# Patient Record
Sex: Male | Born: 1950
Health system: Southern US, Community
[De-identification: ages and names within clinical notes are randomized; demographics above are authoritative.]

## PROBLEM LIST (undated history)

## (undated) DIAGNOSIS — I10 Essential (primary) hypertension: Secondary | ICD-10-CM

## (undated) DIAGNOSIS — L509 Urticaria, unspecified: Secondary | ICD-10-CM

## (undated) DIAGNOSIS — E785 Hyperlipidemia, unspecified: Secondary | ICD-10-CM

## (undated) DIAGNOSIS — E119 Type 2 diabetes mellitus without complications: Secondary | ICD-10-CM

## (undated) DIAGNOSIS — R0602 Shortness of breath: Secondary | ICD-10-CM

## (undated) DIAGNOSIS — K219 Gastro-esophageal reflux disease without esophagitis: Secondary | ICD-10-CM

---

## 2009-02-19 ENCOUNTER — Inpatient Hospital Stay (HOSPITAL_COMMUNITY): Admission: EM | Admit: 2009-02-19 | Discharge: 2009-02-21 | Payer: Self-pay

## 2009-03-04 ENCOUNTER — Ambulatory Visit (HOSPITAL_BASED_OUTPATIENT_CLINIC_OR_DEPARTMENT_OTHER): Admission: RE | Admit: 2009-03-04 | Discharge: 2009-03-04 | Payer: Self-pay | Admitting: Plastic Surgery

## 2009-04-11 HISTORY — PX: COLOSTOMY REVERSAL: SHX5782

## 2009-04-11 HISTORY — PX: WOUND DEBRIDEMENT: SHX247

## 2009-04-11 HISTORY — PX: COLON SURGERY: SHX602

## 2009-05-25 ENCOUNTER — Encounter (INDEPENDENT_AMBULATORY_CARE_PROVIDER_SITE_OTHER): Payer: Self-pay | Admitting: Surgery

## 2009-05-25 ENCOUNTER — Inpatient Hospital Stay (HOSPITAL_COMMUNITY): Admission: RE | Admit: 2009-05-25 | Discharge: 2009-05-29 | Payer: Self-pay | Admitting: Surgery

## 2010-07-02 LAB — GLUCOSE, CAPILLARY
Glucose-Capillary: 107 mg/dL — ABNORMAL HIGH (ref 70–99)
Glucose-Capillary: 109 mg/dL — ABNORMAL HIGH (ref 70–99)
Glucose-Capillary: 112 mg/dL — ABNORMAL HIGH (ref 70–99)
Glucose-Capillary: 115 mg/dL — ABNORMAL HIGH (ref 70–99)
Glucose-Capillary: 118 mg/dL — ABNORMAL HIGH (ref 70–99)
Glucose-Capillary: 118 mg/dL — ABNORMAL HIGH (ref 70–99)
Glucose-Capillary: 129 mg/dL — ABNORMAL HIGH (ref 70–99)
Glucose-Capillary: 137 mg/dL — ABNORMAL HIGH (ref 70–99)
Glucose-Capillary: 142 mg/dL — ABNORMAL HIGH (ref 70–99)
Glucose-Capillary: 144 mg/dL — ABNORMAL HIGH (ref 70–99)
Glucose-Capillary: 146 mg/dL — ABNORMAL HIGH (ref 70–99)
Glucose-Capillary: 84 mg/dL (ref 70–99)

## 2010-07-02 LAB — DIFFERENTIAL
Basophils Absolute: 0 10*3/uL (ref 0.0–0.1)
Basophils Relative: 1 % (ref 0–1)
Eosinophils Absolute: 0.6 10*3/uL (ref 0.0–0.7)
Eosinophils Relative: 7 % — ABNORMAL HIGH (ref 0–5)
Lymphs Abs: 3.5 10*3/uL (ref 0.7–4.0)
Monocytes Relative: 6 % (ref 3–12)
Neutrophils Relative %: 50 % (ref 43–77)

## 2010-07-02 LAB — COMPREHENSIVE METABOLIC PANEL
ALT: 14 U/L (ref 0–53)
AST: 17 U/L (ref 0–37)
Albumin: 3.8 g/dL (ref 3.5–5.2)
Calcium: 9.7 mg/dL (ref 8.4–10.5)
Potassium: 4.5 mEq/L (ref 3.5–5.1)

## 2010-07-02 LAB — URINALYSIS, ROUTINE W REFLEX MICROSCOPIC
Glucose, UA: NEGATIVE mg/dL
Hgb urine dipstick: NEGATIVE
Nitrite: NEGATIVE
Protein, ur: NEGATIVE mg/dL
Specific Gravity, Urine: 1.019 (ref 1.005–1.030)
Urobilinogen, UA: 0.2 mg/dL (ref 0.0–1.0)

## 2010-07-02 LAB — PROTIME-INR: INR: 1.06 (ref 0.00–1.49)

## 2010-07-02 LAB — CBC
Hemoglobin: 13.4 g/dL (ref 13.0–17.0)
MCV: 88.5 fL (ref 78.0–100.0)
Platelets: 271 10*3/uL (ref 150–400)
RDW: 15.4 % (ref 11.5–15.5)

## 2010-07-14 LAB — GLUCOSE, CAPILLARY
Glucose-Capillary: 101 mg/dL — ABNORMAL HIGH (ref 70–99)
Glucose-Capillary: 102 mg/dL — ABNORMAL HIGH (ref 70–99)
Glucose-Capillary: 106 mg/dL — ABNORMAL HIGH (ref 70–99)
Glucose-Capillary: 111 mg/dL — ABNORMAL HIGH (ref 70–99)
Glucose-Capillary: 99 mg/dL (ref 70–99)

## 2010-07-14 LAB — MAGNESIUM: Magnesium: 1.8 mg/dL (ref 1.5–2.5)

## 2010-07-14 LAB — BASIC METABOLIC PANEL
CO2: 24 mEq/L (ref 19–32)
Chloride: 106 mEq/L (ref 96–112)
Creatinine, Ser: 1.28 mg/dL (ref 0.4–1.5)
GFR calc Af Amer: >60 mL/min (ref >60)
Potassium: 3.6 mEq/L (ref 3.5–5.1)
Sodium: 138 mEq/L (ref 135–145)

## 2010-07-14 LAB — CBC
HCT: 29.9 % — ABNORMAL LOW (ref 39.0–52.0)
Hemoglobin: 10.4 g/dL — ABNORMAL LOW (ref 13.0–17.0)
Hemoglobin: 11.6 g/dL — ABNORMAL LOW (ref 13.0–17.0)
MCHC: 34.4 g/dL (ref 30.0–36.0)
MCHC: 34.8 g/dL (ref 30.0–36.0)
MCV: 87.1 fL (ref 78.0–100.0)
RBC: 3.43 MIL/uL — ABNORMAL LOW (ref 4.22–5.81)
RBC: 3.87 MIL/uL — ABNORMAL LOW (ref 4.22–5.81)
RDW: 15.1 % (ref 11.5–15.5)
WBC: 10.6 10*3/uL — ABNORMAL HIGH (ref 4.0–10.5)

## 2010-07-14 LAB — POCT I-STAT, CHEM 8
BUN: 33 mg/dL — ABNORMAL HIGH (ref 6–23)
Chloride: 110 mEq/L (ref 96–112)
Creatinine, Ser: 2.8 mg/dL — ABNORMAL HIGH (ref 0.4–1.5)
Potassium: 4.7 mEq/L (ref 3.5–5.1)
Sodium: 140 mEq/L (ref 135–145)

## 2010-07-14 LAB — COMPREHENSIVE METABOLIC PANEL
ALT: 19 U/L (ref 0–53)
Alkaline Phosphatase: 39 U/L (ref 39–117)
BUN: 12 mg/dL (ref 6–23)
CO2: 27 mEq/L (ref 19–32)
GFR calc non Af Amer: 59 mL/min — ABNORMAL LOW (ref >60)
Glucose, Bld: 90 mg/dL (ref 70–99)
Potassium: 3.8 mEq/L (ref 3.5–5.1)
Sodium: 141 mEq/L (ref 135–145)
Total Protein: 6.7 g/dL (ref 6.0–8.3)

## 2010-07-14 LAB — PHOSPHORUS: Phosphorus: 3.1 mg/dL (ref 2.3–4.6)

## 2012-08-27 ENCOUNTER — Encounter (HOSPITAL_BASED_OUTPATIENT_CLINIC_OR_DEPARTMENT_OTHER): Payer: Self-pay | Admitting: *Deleted

## 2012-08-27 NOTE — Progress Notes (Signed)
08/27/12 1032  OBSTRUCTIVE SLEEP APNEA  Have you ever been diagnosed with sleep apnea through a sleep study? No  Do you snore loudly (loud enough to be heard through closed doors)?  1  Do you often feel tired, fatigued, or sleepy during the daytime? 0  Has anyone observed you stop breathing during your sleep? 0  Do you have, or are you being treated for high blood pressure? 1  BMI more than 35 kg/m2? 1  Age over 62 years old? 1  Gender: 1  Obstructive Sleep Apnea Score 5  Score 4 or greater  Results sent to PCP

## 2012-08-27 NOTE — Progress Notes (Signed)
pts ankle has been fx 3 months-not healing-had labs and ekg 3 days ago-called To bring all meds and overnight bag

## 2012-08-29 ENCOUNTER — Other Ambulatory Visit: Payer: Self-pay | Admitting: Orthopedic Surgery

## 2012-08-30 ENCOUNTER — Ambulatory Visit (HOSPITAL_BASED_OUTPATIENT_CLINIC_OR_DEPARTMENT_OTHER): Payer: Worker's Compensation | Admitting: Anesthesiology

## 2012-08-30 ENCOUNTER — Encounter (HOSPITAL_BASED_OUTPATIENT_CLINIC_OR_DEPARTMENT_OTHER)
Admission: RE | Disposition: A | Discharge status: Home / Self Care | Payer: Self-pay | Source: Ambulatory Visit | Attending: Orthopedic Surgery

## 2012-08-30 ENCOUNTER — Encounter (HOSPITAL_BASED_OUTPATIENT_CLINIC_OR_DEPARTMENT_OTHER): Payer: Self-pay | Admitting: *Deleted

## 2012-08-30 ENCOUNTER — Ambulatory Visit (HOSPITAL_BASED_OUTPATIENT_CLINIC_OR_DEPARTMENT_OTHER)
Admission: RE | Admit: 2012-08-30 | Discharge: 2012-08-31 | Disposition: A | Discharge status: Home / Self Care | Payer: Worker's Compensation | Source: Ambulatory Visit | Attending: Orthopedic Surgery | Admitting: Orthopedic Surgery

## 2012-08-30 ENCOUNTER — Ambulatory Visit (HOSPITAL_COMMUNITY): Payer: Worker's Compensation

## 2012-08-30 ENCOUNTER — Encounter (HOSPITAL_BASED_OUTPATIENT_CLINIC_OR_DEPARTMENT_OTHER): Payer: Self-pay | Admitting: Anesthesiology

## 2012-08-30 DIAGNOSIS — E785 Hyperlipidemia, unspecified: Secondary | ICD-10-CM | POA: Insufficient documentation

## 2012-08-30 DIAGNOSIS — S82409A Unspecified fracture of shaft of unspecified fibula, initial encounter for closed fracture: Secondary | ICD-10-CM | POA: Insufficient documentation

## 2012-08-30 DIAGNOSIS — Z7982 Long term (current) use of aspirin: Secondary | ICD-10-CM | POA: Insufficient documentation

## 2012-08-30 DIAGNOSIS — Y9269 Other specified industrial and construction area as the place of occurrence of the external cause: Secondary | ICD-10-CM | POA: Insufficient documentation

## 2012-08-30 DIAGNOSIS — X58XXXA Exposure to other specified factors, initial encounter: Secondary | ICD-10-CM | POA: Insufficient documentation

## 2012-08-30 DIAGNOSIS — Z87891 Personal history of nicotine dependence: Secondary | ICD-10-CM | POA: Insufficient documentation

## 2012-08-30 DIAGNOSIS — Z79899 Other long term (current) drug therapy: Secondary | ICD-10-CM | POA: Insufficient documentation

## 2012-08-30 DIAGNOSIS — S82209A Unspecified fracture of shaft of unspecified tibia, initial encounter for closed fracture: Secondary | ICD-10-CM | POA: Insufficient documentation

## 2012-08-30 DIAGNOSIS — K219 Gastro-esophageal reflux disease without esophagitis: Secondary | ICD-10-CM | POA: Insufficient documentation

## 2012-08-30 DIAGNOSIS — I10 Essential (primary) hypertension: Secondary | ICD-10-CM | POA: Insufficient documentation

## 2012-08-30 DIAGNOSIS — E119 Type 2 diabetes mellitus without complications: Secondary | ICD-10-CM | POA: Insufficient documentation

## 2012-08-30 DIAGNOSIS — S82871P Displaced pilon fracture of right tibia, subsequent encounter for closed fracture with malunion: Secondary | ICD-10-CM

## 2012-08-30 HISTORY — DX: Hyperlipidemia, unspecified: E78.5

## 2012-08-30 HISTORY — PX: OPEN REDUCTION INTERNAL FIXATION (ORIF) TIBIA/FIBULA FRACTURE: SHX5992

## 2012-08-30 HISTORY — DX: Shortness of breath: R06.02

## 2012-08-30 HISTORY — DX: Type 2 diabetes mellitus without complications: E11.9

## 2012-08-30 HISTORY — DX: Gastro-esophageal reflux disease without esophagitis: K21.9

## 2012-08-30 HISTORY — DX: Essential (primary) hypertension: I10

## 2012-08-30 LAB — GLUCOSE, CAPILLARY
Glucose-Capillary: 127 mg/dL — ABNORMAL HIGH (ref 70–99)
Glucose-Capillary: 144 mg/dL — ABNORMAL HIGH (ref 70–99)

## 2012-08-30 SURGERY — OPEN REDUCTION INTERNAL FIXATION (ORIF) TIBIA/FIBULA FRACTURE
Anesthesia: General | Site: Leg Lower | Laterality: Right | Wound class: Clean

## 2012-08-30 MED ORDER — CEFAZOLIN SODIUM-DEXTROSE 2-3 GM-% IV SOLR
2.0000 g | Freq: Four times a day (QID) | INTRAVENOUS | Status: DC
  Administered 2012-08-30 – 2012-08-31 (×2): 2 g via INTRAVENOUS

## 2012-08-30 MED ORDER — PIOGLITAZONE HCL 30 MG PO TABS: 30.0000 mg | ORAL_TABLET | Freq: Every day | ORAL | Status: DC

## 2012-08-30 MED ORDER — CEFAZOLIN SODIUM 1-5 GM-% IV SOLN
1.0000 g | Freq: Three times a day (TID) | INTRAVENOUS | Status: DC
  Administered 2012-08-30: 1 g via INTRAVENOUS

## 2012-08-30 MED ORDER — LISINOPRIL 10 MG PO TABS: 10.0000 mg | ORAL_TABLET | Freq: Every day | ORAL | Status: DC

## 2012-08-30 MED ORDER — DOCUSATE SODIUM 100 MG PO CAPS: 100.0000 mg | ORAL_CAPSULE | Freq: Two times a day (BID) | ORAL | Status: DC

## 2012-08-30 MED ORDER — OXYCODONE HCL 5 MG/5ML PO SOLN: 5.0000 mg | Freq: Once | ORAL | Status: DC | PRN

## 2012-08-30 MED ORDER — CHLORHEXIDINE GLUCONATE 4 % EX LIQD: 60.0000 mL | Freq: Once | CUTANEOUS | Status: DC

## 2012-08-30 MED ORDER — ASPIRIN EC 325 MG PO TBEC
325.0000 mg | DELAYED_RELEASE_TABLET | Freq: Every day | ORAL | Status: DC
  Administered 2012-08-30: 325 mg via ORAL

## 2012-08-30 MED ORDER — DEXTROSE 5 % IV SOLN
3.0000 g | INTRAVENOUS | Status: AC
  Administered 2012-08-30: 2 g via INTRAVENOUS

## 2012-08-30 MED ORDER — 0.9 % SODIUM CHLORIDE (POUR BTL) OPTIME
TOPICAL | Status: DC | PRN
  Administered 2012-08-30: 500 mL

## 2012-08-30 MED ORDER — CEFAZOLIN SODIUM-DEXTROSE 2-3 GM-% IV SOLR: 2.0000 g | Freq: Four times a day (QID) | INTRAVENOUS | Status: DC

## 2012-08-30 MED ORDER — SENNOSIDES 8.6 MG PO TABS: 2.0000 | ORAL_TABLET | Freq: Every day | ORAL | Status: DC

## 2012-08-30 MED ORDER — SODIUM CHLORIDE 0.9 % IV SOLN: INTRAVENOUS | Status: DC

## 2012-08-30 MED ORDER — FENTANYL CITRATE 0.05 MG/ML IJ SOLN
INTRAMUSCULAR | Status: DC | PRN
  Administered 2012-08-30 (×3): 25 ug via INTRAVENOUS

## 2012-08-30 MED ORDER — FENOFIBRATE 160 MG PO TABS: 160.0000 mg | ORAL_TABLET | Freq: Every day | ORAL | Status: DC

## 2012-08-30 MED ORDER — OXYCODONE HCL 5 MG PO TABS
5.0000 mg | ORAL_TABLET | ORAL | Status: DC | PRN
  Administered 2012-08-30 – 2012-08-31 (×4): 10 mg via ORAL

## 2012-08-30 MED ORDER — INSULIN ASPART 100 UNIT/ML ~~LOC~~ SOLN: 0.0000 [IU] | Freq: Three times a day (TID) | SUBCUTANEOUS | Status: DC

## 2012-08-30 MED ORDER — MORPHINE SULFATE 2 MG/ML IJ SOLN
2.0000 mg | INTRAMUSCULAR | Status: DC | PRN
  Administered 2012-08-30 – 2012-08-31 (×3): 2 mg via INTRAVENOUS

## 2012-08-30 MED ORDER — SENNA 8.6 MG PO TABS: 2.0000 | ORAL_TABLET | Freq: Two times a day (BID) | ORAL | Status: DC

## 2012-08-30 MED ORDER — HYDROMORPHONE HCL PF 1 MG/ML IJ SOLN
0.2500 mg | INTRAMUSCULAR | Status: DC | PRN
  Administered 2012-08-30 (×2): 0.5 mg via INTRAVENOUS

## 2012-08-30 MED ORDER — ONDANSETRON HCL 4 MG/2ML IJ SOLN: 4.0000 mg | Freq: Four times a day (QID) | INTRAMUSCULAR | Status: DC | PRN

## 2012-08-30 MED ORDER — OMEGA-3 FATTY ACIDS 1000 MG PO CAPS: 2.0000 g | ORAL_CAPSULE | Freq: Every day | ORAL | Status: DC

## 2012-08-30 MED ORDER — METFORMIN HCL 500 MG PO TABS: 500.0000 mg | ORAL_TABLET | Freq: Two times a day (BID) | ORAL | Status: DC

## 2012-08-30 MED ORDER — SODIUM CHLORIDE 0.9 % IV SOLN
INTRAVENOUS | Status: DC
  Administered 2012-08-30: 18:00:00 via INTRAVENOUS

## 2012-08-30 MED ORDER — ONDANSETRON HCL 4 MG PO TABS: 4.0000 mg | ORAL_TABLET | Freq: Four times a day (QID) | ORAL | Status: DC | PRN

## 2012-08-30 MED ORDER — MIDAZOLAM HCL 5 MG/5ML IJ SOLN
INTRAMUSCULAR | Status: DC | PRN
  Administered 2012-08-30: 2 mg via INTRAVENOUS

## 2012-08-30 MED ORDER — BACITRACIN ZINC 500 UNIT/GM EX OINT
TOPICAL_OINTMENT | CUTANEOUS | Status: DC | PRN
  Administered 2012-08-30: 1 via TOPICAL

## 2012-08-30 MED ORDER — PROPOFOL 10 MG/ML IV BOLUS
INTRAVENOUS | Status: DC | PRN
  Administered 2012-08-30: 300 mg via INTRAVENOUS

## 2012-08-30 MED ORDER — SIMVASTATIN 20 MG PO TABS: 20.0000 mg | ORAL_TABLET | Freq: Every evening | ORAL | Status: DC

## 2012-08-30 MED ORDER — OXYCODONE HCL 5 MG PO TABS: 5.0000 mg | ORAL_TABLET | ORAL | Status: DC | PRN

## 2012-08-30 MED ORDER — MIDAZOLAM HCL 2 MG/2ML IJ SOLN
1.0000 mg | INTRAMUSCULAR | Status: DC | PRN
  Administered 2012-08-30: 2 mg via INTRAVENOUS

## 2012-08-30 MED ORDER — ASPIRIN EC 325 MG PO TBEC: 325.0000 mg | DELAYED_RELEASE_TABLET | Freq: Every day | ORAL | Status: DC

## 2012-08-30 MED ORDER — ENOXAPARIN SODIUM 40 MG/0.4ML ~~LOC~~ SOLN: 40.0000 mg | SUBCUTANEOUS | Status: DC

## 2012-08-30 MED ORDER — LIDOCAINE HCL (CARDIAC) 20 MG/ML IV SOLN
INTRAVENOUS | Status: DC | PRN
  Administered 2012-08-30: 75 mg via INTRAVENOUS

## 2012-08-30 MED ORDER — ONDANSETRON HCL 4 MG/2ML IJ SOLN: 4.0000 mg | Freq: Once | INTRAMUSCULAR | Status: DC | PRN

## 2012-08-30 MED ORDER — OXYCODONE HCL 5 MG PO TABS: 5.0000 mg | ORAL_TABLET | Freq: Once | ORAL | Status: DC | PRN

## 2012-08-30 MED ORDER — BUPIVACAINE-EPINEPHRINE PF 0.5-1:200000 % IJ SOLN
INTRAMUSCULAR | Status: DC | PRN
  Administered 2012-08-30: 30 mL

## 2012-08-30 MED ORDER — FENTANYL CITRATE 0.05 MG/ML IJ SOLN
50.0000 ug | INTRAMUSCULAR | Status: DC | PRN
  Administered 2012-08-30: 100 ug via INTRAVENOUS

## 2012-08-30 MED ORDER — LACTATED RINGERS IV SOLN
INTRAVENOUS | Status: DC
  Administered 2012-08-30 (×2): via INTRAVENOUS

## 2012-08-30 SURGICAL SUPPLY — 91 items
BAG DECANTER FOR FLEXI CONT (MISCELLANEOUS) IMPLANT
BANDAGE ESMARK 6X9 LF (GAUZE/BANDAGES/DRESSINGS) ×1 IMPLANT
BIT DRILL 2.5X2.75 QC CALB (BIT) ×2 IMPLANT
BIT DRILL CALIBRATED 2.7 (BIT) ×2 IMPLANT
BLADE SURG 15 STRL LF DISP TIS (BLADE) ×3 IMPLANT
BLADE SURG 15 STRL SS (BLADE) ×3
BNDG COHESIVE 4X5 TAN STRL (GAUZE/BANDAGES/DRESSINGS) ×2 IMPLANT
BNDG COHESIVE 6X5 TAN STRL LF (GAUZE/BANDAGES/DRESSINGS) ×2 IMPLANT
BNDG ESMARK 6X9 LF (GAUZE/BANDAGES/DRESSINGS) ×2
CHLORAPREP W/TINT 26ML (MISCELLANEOUS) ×2 IMPLANT
COVER TABLE BACK 60X90 (DRAPES) ×4 IMPLANT
CUFF TOURNIQUET SINGLE 34IN LL (TOURNIQUET CUFF) ×2 IMPLANT
DECANTER SPIKE VIAL GLASS SM (MISCELLANEOUS) IMPLANT
DRAPE C-ARM 42X72 X-RAY (DRAPES) ×2 IMPLANT
DRAPE C-ARMOR (DRAPES) ×2 IMPLANT
DRAPE EXTREMITY T 121X128X90 (DRAPE) ×2 IMPLANT
DRAPE OEC MINIVIEW 54X84 (DRAPES) IMPLANT
DRAPE U-SHAPE 47X51 STRL (DRAPES) ×2 IMPLANT
DRSG EMULSION OIL 3X3 NADH (GAUZE/BANDAGES/DRESSINGS) ×2 IMPLANT
DRSG PAD ABDOMINAL 8X10 ST (GAUZE/BANDAGES/DRESSINGS) ×4 IMPLANT
GLOVE BIO SURGEON STRL SZ 6.5 (GLOVE) ×2 IMPLANT
GLOVE BIO SURGEON STRL SZ7 (GLOVE) ×2 IMPLANT
GLOVE BIO SURGEON STRL SZ8 (GLOVE) ×2 IMPLANT
GLOVE BIOGEL PI IND STRL 6.5 (GLOVE) ×1 IMPLANT
GLOVE BIOGEL PI IND STRL 7.0 (GLOVE) ×1 IMPLANT
GLOVE BIOGEL PI IND STRL 8 (GLOVE) ×1 IMPLANT
GLOVE BIOGEL PI INDICATOR 6.5 (GLOVE) ×1
GLOVE BIOGEL PI INDICATOR 7.0 (GLOVE) ×1
GLOVE BIOGEL PI INDICATOR 8 (GLOVE) ×1
GLOVE EXAM NITRILE MD LF STRL (GLOVE) ×2 IMPLANT
GOWN PREVENTION PLUS XLARGE (GOWN DISPOSABLE) IMPLANT
GOWN PREVENTION PLUS XXLARGE (GOWN DISPOSABLE) IMPLANT
K-WIRE ACE 1.6X6 (WIRE) ×10
KWIRE ACE 1.6X6 (WIRE) ×5 IMPLANT
NEEDLE HYPO 22GX1.5 SAFETY (NEEDLE) IMPLANT
NS IRRIG 1000ML POUR BTL (IV SOLUTION) ×2 IMPLANT
PACK BASIN DAY SURGERY FS (CUSTOM PROCEDURE TRAY) ×2 IMPLANT
PAD CAST 4YDX4 CTTN HI CHSV (CAST SUPPLIES) ×2 IMPLANT
PADDING CAST ABS 4INX4YD NS (CAST SUPPLIES)
PADDING CAST ABS COTTON 4X4 ST (CAST SUPPLIES) IMPLANT
PADDING CAST COTTON 4X4 STRL (CAST SUPPLIES) ×2
PENCIL BUTTON HOLSTER BLD 10FT (ELECTRODE) ×2 IMPLANT
PLATE 6H RT DIST ANTLAT TIB (Plate) ×1 IMPLANT
PLATE ACE 100DEG 7HOLE (Plate) ×2 IMPLANT
PLATE ANTLAT CNTR W 115X6 (Plate) ×1 IMPLANT
SANITIZER HAND PURELL 535ML FO (MISCELLANEOUS) ×2 IMPLANT
SCREW CANC LAG 4X45 (Screw) ×2 IMPLANT
SCREW CORT FT 32X3.5XNONLOCK (Screw) ×1 IMPLANT
SCREW CORTICAL 3.5MM  16MM (Screw) ×1 IMPLANT
SCREW CORTICAL 3.5MM  30MM (Screw) ×1 IMPLANT
SCREW CORTICAL 3.5MM  32MM (Screw) ×1 IMPLANT
SCREW CORTICAL 3.5MM  34MM (Screw) ×2 IMPLANT
SCREW CORTICAL 3.5MM 14MM (Screw) ×6 IMPLANT
SCREW CORTICAL 3.5MM 16MM (Screw) ×1 IMPLANT
SCREW CORTICAL 3.5MM 18MM (Screw) ×2 IMPLANT
SCREW CORTICAL 3.5MM 22MM (Screw) ×4 IMPLANT
SCREW CORTICAL 3.5MM 30MM (Screw) ×1 IMPLANT
SCREW CORTICAL 3.5MM 34MM (Screw) ×2 IMPLANT
SCREW LOCK 3.5X32 DIST TIB (Orthopedic Implant) ×2 IMPLANT
SCREW LOCK CORT STAR 3.5X28 (Screw) ×2 IMPLANT
SCREW LOCK CORT STAR 3.5X36 (Screw) ×2 IMPLANT
SCREW LOCK CORT STAR 3.5X38 (Screw) ×2 IMPLANT
SCREW LOCK CORT STAR 3.5X44 (Screw) ×2 IMPLANT
SCREW LOCK CORT STAR 3.5X46 (Screw) ×2 IMPLANT
SCREW NLOCK CANC HEX 4X18 (Screw) ×1 IMPLANT
SCREW NLOCK CANC HEX 4X18 FIB (Screw) ×1 IMPLANT
SCREW SCHNZ BLNT TRCR PT 5X200 (Screw) ×2 IMPLANT
SCRW SCHANZ BLNT TRCR PT 5X200 (Screw) ×4 IMPLANT
SHEET MEDIUM DRAPE 40X70 STRL (DRAPES) ×4 IMPLANT
SLEEVE SCD COMPRESS KNEE MED (MISCELLANEOUS) ×2 IMPLANT
SPLINT FAST PLASTER 5X30 (CAST SUPPLIES) ×20
SPLINT PLASTER CAST FAST 5X30 (CAST SUPPLIES) ×20 IMPLANT
SPONGE GAUZE 4X4 12PLY (GAUZE/BANDAGES/DRESSINGS) ×2 IMPLANT
SPONGE LAP 18X18 X RAY DECT (DISPOSABLE) ×2 IMPLANT
STAPLER VISISTAT 35W (STAPLE) IMPLANT
STOCKINETTE 6  STRL (DRAPES) ×1
STOCKINETTE 6 STRL (DRAPES) ×1 IMPLANT
STRIP CLOSURE SKIN 1/2X4 (GAUZE/BANDAGES/DRESSINGS) IMPLANT
SUCTION FRAZIER TIP 10 FR DISP (SUCTIONS) ×2 IMPLANT
SUT ETHILON 3 0 PS 1 (SUTURE) ×4 IMPLANT
SUT MNCRL AB 3-0 PS2 18 (SUTURE) ×4 IMPLANT
SUT MNCRL AB 4-0 PS2 18 (SUTURE) IMPLANT
SUT VIC AB 0 SH 27 (SUTURE) ×2 IMPLANT
SUT VIC AB 2-0 SH 27 (SUTURE)
SUT VIC AB 2-0 SH 27XBRD (SUTURE) IMPLANT
SUT VICRYL 4-0 PS2 18IN ABS (SUTURE) IMPLANT
SYR 20CC LL (SYRINGE) IMPLANT
SYR BULB 3OZ (MISCELLANEOUS) ×2 IMPLANT
TAP CORT 3.5MM SOLID DISP (TRAUMA) ×2 IMPLANT
TUBE CONNECTING 20X1/4 (TUBING) ×2 IMPLANT
UNDERPAD 30X30 INCONTINENT (UNDERPADS AND DIAPERS) ×2 IMPLANT

## 2012-08-30 NOTE — Anesthesia Preprocedure Evaluation (Signed)
Anesthesia Evaluation  Patient identified by MRN, date of birth, ID band Patient awake    Reviewed: Allergy & Precautions, H&P , NPO status , Patient's Chart, lab work & pertinent test results  Airway Mallampati: II TM Distance: >3 FB Neck ROM: Full    Dental  (+) Teeth Intact and Dental Advisory Given   Pulmonary  breath sounds clear to auscultation        Cardiovascular hypertension, Pt. on medications Rhythm:Regular Rate:Normal     Neuro/Psych    GI/Hepatic GERD-  Medicated,  Endo/Other  Morbid obesity  Renal/GU      Musculoskeletal   Abdominal   Peds  Hematology   Anesthesia Other Findings   Reproductive/Obstetrics                           Anesthesia Physical Anesthesia Plan  ASA: III  Anesthesia Plan: General   Post-op Pain Management:    Induction: Intravenous  Airway Management Planned: LMA  Additional Equipment:   Intra-op Plan:   Post-operative Plan: Extubation in OR  Informed Consent: I have reviewed the patients History and Physical, chart, labs and discussed the procedure including the risks, benefits and alternatives for the proposed anesthesia with the patient or authorized representative who has indicated his/her understanding and acceptance.   Dental advisory given  Plan Discussed with: CRNA and Anesthesiologist  Anesthesia Plan Comments:         Anesthesia Quick Evaluation

## 2012-08-30 NOTE — Progress Notes (Signed)
Assisted Dr. Crews with right, ultrasound guided saphenous block. Side rails up, monitors on throughout procedure. See vital signs in flow sheet. Tolerated Procedure well. 

## 2012-08-30 NOTE — Brief Op Note (Signed)
08/30/2012  4:17 PM  PATIENT:  [name]  62 y.o. male  PRE-OPERATIVE DIAGNOSIS:  DISPLACED RIGHT TIBIAL PILON AND FIBULA FRACTURES  POST-OPERATIVE DIAGNOSIS:  same  Procedure(s): 1.  Open reduction and internal fixation of right tibial pilon and fibula fractures  SURGEON:  Toni Arthurs, MD  ASSISTANT: n/a  ANESTHESIA:   General, regional  EBL:  minimal   TOURNIQUET:   Total Tourniquet Time Documented: Thigh (Right) - 140 minutes Total: Thigh (Right) - 140 minutes   COMPLICATIONS:  None apparent  DISPOSITION:  Extubated, awake and stable to recovery.  DICTATION ID:  [id number]

## 2012-08-30 NOTE — Transfer of Care (Signed)
Immediate Anesthesia Transfer of Care Note  Patient: Javier Walker  Procedure(s) Performed: Procedure(s): OPEN REDUCTION INTERNAL FIXATION (ORIF) TIBIA/FIBULA FRACTURE (Right)  Patient Location: PACU  Anesthesia Type:GA combined with regional for post-op pain  Level of Consciousness: sedated and patient cooperative  Airway & Oxygen Therapy: Patient Spontanous Breathing and Patient connected to face mask oxygen  Post-op Assessment: Report given to PACU RN and Post -op Vital signs reviewed and stable  Post vital signs: Reviewed and stable  Complications: No apparent anesthesia complications

## 2012-08-30 NOTE — H&P (Signed)
Javier Walker is an 61 y.o. male.   Chief Complaint: right tibial pilon and fibula fxs HPI: 62 yo male now 3 mos s/p right tibial pilon and fibula fractures sustained at work.  He was initially anatomically reduced, but his fractures have moved into unacceptable alignment.  He presents now for ORIF of his tibial pilon and fibula fractures.  Past Medical History  Diagnosis Date  . Hypertension   . Hyperlipemia   . Diabetes mellitus without complication   . GERD (gastroesophageal reflux disease)     occ-no meds  . Shortness of breath     Past Surgical History  Procedure Laterality Date  . Colon surgery  2011    gangrene lower colon with colostomy  . Wound debridement  2011    abd-  . Colostomy reversal  2011    History reviewed. No pertinent family history. Social History:  reports that he quit smoking about 24 years ago. He does not have any smokeless tobacco history on file. He reports that  drinks alcohol. His drug history is not on file.  Allergies: No Known Allergies  Medications Prior to Admission  Medication Sig Dispense Refill  . aspirin 81 MG tablet Take 81 mg by mouth daily.      . fenofibrate (TRICOR) 145 MG tablet Take 145 mg by mouth daily.      . fish oil-omega-3 fatty acids 1000 MG capsule Take 2 g by mouth daily.      [name] lisinopril (PRINIVIL,ZESTRIL) 10 MG tablet Take 10 mg by mouth daily.      . metFORMIN (GLUCOPHAGE) 500 MG tablet Take 500 mg by mouth 2 (two) times daily with a meal.      . pioglitazone (ACTOS) 15 MG tablet Take 30 mg by mouth daily.      . simvastatin (ZOCOR) 20 MG tablet Take 20 mg by mouth every evening.        Results for orders placed during the hospital encounter of 2012-09-10 (from the past 48 hour(s))  GLUCOSE, CAPILLARY     Status: Abnormal   Collection Time    09-10-12 11:49 AM      Result Value Range   Glucose-Capillary 111 (*) 70 - 99 mg/dL   No results found.  ROS  No recent f/c/n/v/ wt loss  Blood pressure 151/83, pulse  72, temperature 97.5 F (36.4 C), temperature source Oral, resp. rate 18, height 5\' 11"  (1.803 m), weight 117.935 kg (260 lb), SpO2 100.00%. Physical Exam  wn wd male in nad.  A and O.  Mood and affect normal.  EOMI.  Resp unlabored.  R ankle with healthy skin.  1= dp and PT pulses.  Feels LT throughout the ankle.  Active PF and DF 5/5 strength at toes.  Assessment/Plan Displaced right tibial pilon and fibula fractures - to OR for ORIF of tibia and fibula fractures.  The risks and benefits of the alternative treatment options have been discussed in detail.  The patient wishes to proceed with surgery and specifically understands risks of bleeding, infection, nerve damage, blood clots, need for additional surgery, amputation and death.   Toni Arthurs 09-10-2012, 12:32 PM

## 2012-08-30 NOTE — Progress Notes (Signed)
Assisted Dr. Crews with right, ultrasound guided, popliteal block. Side rails up, monitors on throughout procedure. See vital signs in flow sheet. Tolerated Procedure well. 

## 2012-08-30 NOTE — Progress Notes (Signed)
Pre sahpenous block timeout post orif Rt tib/fib fx. Dr. Sheldon Silvan and Royce Macadamia, RN. See Doc flow sheet.

## 2012-08-30 NOTE — Progress Notes (Signed)
1700-1710:  The pt was having severe pain in his medial right leg post op.  I elected to perform a Right Saphenous US guided nerve block.  Prep of the leg with Chlorhexidene, sterile procedure with gloves and draping.  22g, 9 cm needle was used and 12 ml of Marcaine 0.5% with 1:200,000 Epi was given.  Image was saved in the chart. Tolerated well.  He seemed to have improvement in his pain within 10 min.

## 2012-08-30 NOTE — Anesthesia Procedure Notes (Addendum)
Anesthesia Regional Block:  Popliteal block  Pre-Anesthetic Checklist: ,, timeout performed, Correct Patient, Correct Site, Correct Laterality, Correct Procedure, Correct Position, site marked, Risks and benefits discussed,  Surgical consent,  Pre-op evaluation,  At surgeon's request and post-op pain management  Laterality: Right and Lower  Prep: chloraprep       Needles:  Injection technique: Single-shot  Needle Type: Echogenic Needle     Needle Length: 9cm  Needle Gauge: 22 and 22 G    Additional Needles:  Procedures: ultrasound guided (picture in chart) Popliteal block Narrative:  Start time: 08/30/2012 12:20 PM End time: 08/30/2012 12:26 PM Injection made incrementally with aspirations every 5 mL.  Performed by: Personally  Anesthesiologist: Sheldon Silvan, MD  Popliteal block Procedure Name: LMA Insertion Date/Time: 08/30/2012 1:12 PM Performed by: Zenia Resides D Pre-anesthesia Checklist: Patient identified, Emergency Drugs available, Suction available and Patient being monitored Patient Re-evaluated:Patient Re-evaluated prior to inductionOxygen Delivery Method: Circle System Utilized Preoxygenation: Pre-oxygenation with 100% oxygen Intubation Type: IV induction Ventilation: Mask ventilation without difficulty LMA: LMA inserted LMA Size: 5.0 Number of attempts: 1 Airway Equipment and Method: bite block Placement Confirmation: positive ETCO2 Tube secured with: Tape Dental Injury: Teeth and Oropharynx as per pre-operative assessment

## 2012-08-30 NOTE — Anesthesia Postprocedure Evaluation (Signed)
  Anesthesia Post-op Note  Patient: Javier Walker  Procedure(s) Performed: Procedure(s): OPEN REDUCTION INTERNAL FIXATION (ORIF) TIBIA/FIBULA FRACTURE (Right)  Patient Location: PACU  Anesthesia Type:GA combined with regional for post-op pain  Level of Consciousness: awake, alert  and oriented  Airway and Oxygen Therapy: Patient Spontanous Breathing  Post-op Pain: mild  Post-op Assessment: Post-op Vital signs reviewed  Post-op Vital Signs: Reviewed  Complications: No apparent anesthesia complications

## 2012-08-31 LAB — GLUCOSE, CAPILLARY: Glucose-Capillary: 122 mg/dL — ABNORMAL HIGH (ref 70–99)

## 2012-08-31 LAB — POCT HEMOGLOBIN-HEMACUE: Hemoglobin: 15 g/dL (ref 13.0–17.0)

## 2012-08-31 NOTE — Op Note (Signed)
NAME:  CONNELLY, [place] NO.:  [id number]  MEDICAL RECORD NO.:  [id number]  LOCATION:                                 FACILITY:  PHYSICIAN:  Toni Arthurs, MD             DATE OF BIRTH:  DATE OF PROCEDURE:  08/30/2012 DATE OF DISCHARGE:                              OPERATIVE REPORT   PREOPERATIVE DIAGNOSIS:  Displaced right tibial pilon and fibula fractures.  POSTOPERATIVE DIAGNOSIS:  Displaced right tibial pilon and fibula fractures.  PROCEDURE:  Open reduction and internal fixation of right tibial pilon and fibula fractures.  SURGEON:  Toni Arthurs, MD  ANESTHESIA:  General, regional.  ESTIMATED BLOOD LOSS:  Minimal.  TOURNIQUET TIME:  2 hours and 20 minutes at 250 mmHg.  COMPLICATIONS:  None apparent.  DISPOSITION:  Extubated, awake, and stable to recovery.  INDICATIONS FOR PROCEDURE:  The patient is a 62 year old male with past medical history significant for diabetes.  He fell at work approximately 3 months ago, injuring his right distal tibia and fibula.  Upon initial presentation in clinic his fractures were anatomically reduced.  An attempt was made to treat this in closed fashion.  Given the risks associated with surgical treatment from his diabetes.  He initially did well, but on the most recent followup visit was noted to have significant displacement of his distal tibia and fibula fractures.  He presents now for operative treatment of these displaced unstable fractures.  He understands the risks and benefits, the alternative treatment options and elects surgical treatment.  He specifically understands risks of bleeding, infection, nerve damage, blood clots, need for additional surgery, amputation, and death.  PROCEDURE IN DETAIL:  After preoperative consent was obtained, the correct operative site was identified.  The patient was brought to the operating room and placed supine on the operating table.  General anesthesia was induced.   Preoperative antibiotics were administered. Surgical time-out was taken.  The right lower extremity was prepped and draped in standard sterile fashion with the tourniquet around the thigh. The extremity was exsanguinated and tourniquet was inflated to 250 mmHg. A longitudinal incision was then made over the lateral malleolus.  Sharp dissection was carried down through the skin and subcutaneous tissue. Fracture site was identified.  It was noted to be partially healed but displaced.  The fracture was mobilized with a curette rongeur and osteotome.  Once the fracture was mobile, attention was then turned to the anterior aspect of the ankle.  An anterior longitudinal incision was made over the extensor hallucis longus tendon.  Sharp dissection was carried down through the skin and subcutaneous tissue.  The EHL tendon sheath was incised.  The neurovascular bundle was identified.  It was retracted laterally and subperiosteal dissection was carried down to the anterior aspect of the tibia.  The anterior tibia was exposed at the fracture site above and below.  The fracture was noted to be displaced and significantly shortened.  Despite mobilizing the fracture fragments adequate length could not be restored.  The decision was made at that point to apply the Synthes Universal distractor. A stab incision was made at the inferior aspect of the tibial  tubercle. A 5-mm Schanz pin was inserted into the metaphyseal bone.  A second Schanz pin was inserted through a stab incision into the lateral wall of the calcaneus.  The universal distractor was applied and used to lengthen the fracture site.  Once adequate length was achieved, the fibular fracture was reduced.  It was held with a lobster claw as a 3.5- mm fully-threaded lag screw was inserted from anterior to posterior.  AP and lateral fluoroscopic images confirmed appropriate reduction and appropriate position length of the lag screw.  A 7-hole  1/3-tubular plate was then contoured to fit the lateral malleolus.  It was secured distally with 3 bicortical screws and proximally with 3 unicortical screws.  AP and lateral fluoroscopic images confirmed appropriate reduction of the fibula and appropriate position and length of all hardware.  Wound was irrigated copiously.  Inverted simple sutures of 3- 0 Monocryl were used to close the subcutaneous tissue over the plate, a running 3-0 nylon was used to close the skin incision.  Attention was then returned to the anterior aspect of the ankle.  The fracture fragments were mobilized and irrigated copiously.  They were reduced and held provisionally with a lobster claw proximally and K- wires distally.  AP and lateral fluoroscopic images confirmed appropriate reduction of the fractures.  A Biomet wide anterior plate 6 holes in length was applied to the anterior aspect of the tibia.  The proximal oblong hole was drilled and a 3.5-mm fully-threaded bicortical screw was inserted.  The guide pin was inserted in the distal limb of the plate.  The lateral view confirmed appropriate position of the plate over the anterior lip of the tibia.  The central distal hole was drilled and a 4-mm partially threaded screw was inserted.  This pulled the plate down securely to the anterior aspect of the tibia.  The remaining 3 locking holes in the distal limb of the plate were drilled and filled with fully-threaded locking screws.  The 4-mm partially threaded screw was then removed and replaced with a locking screw.  The distal most medial hole in the plate was then drilled and a locking multidirectional screw was inserted.  This gave a second point of purchase into the medial malleolus fragment.  Attention was then returned to the proximal end of the plate where another bicortical locking screw was inserted and 2 more non-locking bicortical screws were inserted.  The wound was again irrigated copiously.   Final AP, mortise, and lateral views of the ankle showed appropriate reduction of the fracture, and appropriate position and length of all hardware.  The external fixator was removed, along with both Shantz pins.  This had been performed prior to obtaining the final x-rays.  The retinaculum was then repaired with figure-of-eight and simple sutures of 0 Vicryl.  The subcutaneous tissue was approximated with inverted simple sutures of 3-0 Monocryl and a running 3-0 nylon was used to close the skin incision.  Sterile dressings were applied.  The tourniquet was released at 2 hours and 20 minutes after closure of the wound.  Sterile dressings were applied followed by well- padded short-leg splint.  The patient was awakened by anesthesia and transported to recovery room in stable condition.  FOLLOWUP PLAN:  The patient will be observed overnight for pain control. He will be nonweightbearing on the right lower extremity and follow up with me in 2 weeks for suture removal and conversion to a cast.     Toni Arthurs, MD  JH/MEDQ  D:  08/30/2012  T:  08/31/2012  Job:  [id number]

## 2012-09-04 ENCOUNTER — Encounter (HOSPITAL_BASED_OUTPATIENT_CLINIC_OR_DEPARTMENT_OTHER): Payer: Self-pay | Admitting: Orthopedic Surgery

## 2012-11-19 ENCOUNTER — Ambulatory Visit (HOSPITAL_COMMUNITY): Payer: Self-pay | Admitting: Physical Therapy

## 2015-12-22 LAB — HEMOGLOBIN A1C: Hemoglobin A1C: 9

## 2016-03-01 ENCOUNTER — Ambulatory Visit (INDEPENDENT_AMBULATORY_CARE_PROVIDER_SITE_OTHER): Payer: BLUE CROSS/BLUE SHIELD | Admitting: "Endocrinology

## 2016-03-01 ENCOUNTER — Encounter: Payer: Self-pay | Admitting: "Endocrinology

## 2016-03-01 ENCOUNTER — Ambulatory Visit: Payer: Self-pay | Admitting: "Endocrinology

## 2016-03-01 VITALS — BP 110/68 | HR 77 | Ht 71.0 in | Wt 222.0 lb

## 2016-03-01 DIAGNOSIS — E118 Type 2 diabetes mellitus with unspecified complications: Secondary | ICD-10-CM

## 2016-03-01 DIAGNOSIS — I10 Essential (primary) hypertension: Secondary | ICD-10-CM | POA: Diagnosis not present

## 2016-03-01 DIAGNOSIS — E7800 Pure hypercholesterolemia, unspecified: Secondary | ICD-10-CM | POA: Insufficient documentation

## 2016-03-01 DIAGNOSIS — Z6838 Body mass index (BMI) 38.0-38.9, adult: Secondary | ICD-10-CM

## 2016-03-01 DIAGNOSIS — N183 Chronic kidney disease, stage 3 (moderate): Secondary | ICD-10-CM

## 2016-03-01 DIAGNOSIS — IMO0002 Reserved for concepts with insufficient information to code with codable children: Secondary | ICD-10-CM

## 2016-03-01 DIAGNOSIS — E1122 Type 2 diabetes mellitus with diabetic chronic kidney disease: Secondary | ICD-10-CM | POA: Insufficient documentation

## 2016-03-01 DIAGNOSIS — E1165 Type 2 diabetes mellitus with hyperglycemia: Secondary | ICD-10-CM

## 2016-03-01 DIAGNOSIS — Z794 Long term (current) use of insulin: Secondary | ICD-10-CM

## 2016-03-01 NOTE — Progress Notes (Signed)
Subjective:    Patient ID: Javier Walker, male    DOB: Sep 04, 1950. Patient is being seen in consultation for management of diabetes requested by  Jana Half  Past Medical History:  Diagnosis Date  . Diabetes mellitus without complication ([place])   . GERD (gastroesophageal reflux disease)    occ-no meds  . Hyperlipemia   . Hypertension   . Shortness of breath    Past Surgical History:  Procedure Laterality Date  . COLON SURGERY  2011   gangrene lower colon with colostomy  . COLOSTOMY REVERSAL  2011  . OPEN REDUCTION INTERNAL FIXATION (ORIF) TIBIA/FIBULA FRACTURE Right 08/30/2012   Procedure: OPEN REDUCTION INTERNAL FIXATION (ORIF) TIBIA/FIBULA FRACTURE;  Surgeon: Wylene Simmer, MD;  Location: [facility];  Service: Orthopedics;  Laterality: Right;  . WOUND DEBRIDEMENT  2011   abd-   Social History   Social History  . Marital status: Married    Spouse name: N/A  . Number of children: N/A  . Years of education: N/A   Social History Main Topics  . Smoking status: Former Smoker    Quit date: 08/27/1988  . Smokeless tobacco: Never Used  . Alcohol use Yes  . Drug use: No  . Sexual activity: Not Asked   Other Topics Concern  . None   Social History Narrative  . None   Outpatient Encounter Prescriptions as of 03/01/2016  Medication Sig  . Canagliflozin-Metformin HCl (INVOKAMET) [phone] MG TABS Take by mouth daily.  . fenofibrate (TRICOR) 145 MG tablet Take 145 mg by mouth daily.  [name] lisinopril (PRINIVIL,ZESTRIL) 10 MG tablet Take 10 mg by mouth daily.  . simvastatin (ZOCOR) 40 MG tablet Take 40 mg by mouth daily.  . sitaGLIPtin (JANUVIA) 100 MG tablet Take 100 mg by mouth daily.   No facility-administered encounter medications on file as of 03/01/2016.    ALLERGIES: No Known Allergies VACCINATION STATUS:  There is no immunization history on file for this patient.  Diabetes  He presents for his initial diabetic visit. He has type 2  diabetes mellitus. Onset time: He was diagnosed at approximate age of 65 years. His disease course has been worsening. There are no hypoglycemic associated symptoms. Pertinent negatives for hypoglycemia include no confusion, headaches, pallor or seizures. Associated symptoms include blurred vision, polydipsia and polyuria. Pertinent negatives for diabetes include no chest pain, no fatigue, no polyphagia and no weakness. There are no hypoglycemic complications. Symptoms are worsening. There are no diabetic complications. Risk factors for coronary artery disease include diabetes mellitus, dyslipidemia, hypertension, male sex, obesity, tobacco exposure and sedentary lifestyle. Current diabetic treatment includes oral agent (dual therapy). His weight is decreasing steadily. He is following a generally unhealthy diet. When asked about meal planning, he reported none. He has not had a previous visit with a dietitian. He never participates in exercise. Home blood sugar record trend: He did not bring any meter nor logs to review today. He admits he does not monitor blood glucose regularly. An ACE inhibitor/angiotensin II receptor blocker is being taken. Eye exam is current.  Hyperlipidemia  This is a chronic problem. The current episode started more than 1 year ago. Pertinent negatives include no chest pain, myalgias or shortness of breath. Current antihyperlipidemic treatment includes statins. Risk factors for coronary artery disease include diabetes mellitus, dyslipidemia, hypertension, male sex, obesity and a sedentary lifestyle.  Hypertension  This is a chronic problem. The current episode started more than 1 year ago. The problem is controlled. Associated symptoms  include blurred vision. Pertinent negatives include no chest pain, headaches, neck pain, palpitations or shortness of breath. Risk factors for coronary artery disease include dyslipidemia, obesity, male gender, sedentary lifestyle and smoking/tobacco  exposure. Past treatments include ACE inhibitors.       Review of Systems  Constitutional: Positive for unexpected weight change. Negative for chills, fatigue and fever.  HENT: Negative for dental problem, mouth sores and trouble swallowing.   Eyes: Positive for blurred vision. Negative for visual disturbance.  Respiratory: Negative for cough, choking, chest tightness, shortness of breath and wheezing.   Cardiovascular: Negative for chest pain, palpitations and leg swelling.  Gastrointestinal: Negative for abdominal distention, abdominal pain, constipation, diarrhea, nausea and vomiting.  Endocrine: Positive for polydipsia and polyuria. Negative for polyphagia.  Genitourinary: Negative for dysuria, flank pain, hematuria and urgency.  Musculoskeletal: Negative for back pain, gait problem, myalgias and neck pain.  Skin: Negative for pallor, rash and wound.  Neurological: Negative for seizures, syncope, weakness, numbness and headaches.  Psychiatric/Behavioral: Negative.  Negative for confusion and dysphoric mood.    Objective:    BP 110/68   Pulse 77   Ht _0  (1.803 m)   Wt 222 lb (100.7 kg)   BMI 30.96 kg/m   Wt Readings from Last 3 Encounters:  03/01/16 222 lb (100.7 kg)  08/30/12 260 lb (117.9 kg)    Physical Exam  Constitutional: He is oriented to person, place, and time. He appears well-developed. He is cooperative. No distress.  HENT:  Head: Normocephalic and atraumatic.  Eyes: EOM are normal.  Neck: Normal range of motion. Neck supple. No tracheal deviation present. No thyromegaly present.  Cardiovascular: Normal rate, S1 normal, S2 normal and normal heart sounds.  Exam reveals no gallop.   No murmur heard. Pulses:      Dorsalis pedis pulses are 1+ on the right side, and 1+ on the left side.       Posterior tibial pulses are 1+ on the right side, and 1+ on the left side.  Pulmonary/Chest: Breath sounds normal. No respiratory distress. He has no wheezes.   Abdominal: Soft. Bowel sounds are normal. He exhibits no distension. There is no tenderness. There is no guarding and no CVA tenderness.  Musculoskeletal: He exhibits no edema.       Right shoulder: He exhibits no swelling and no deformity.  Neurological: He is alert and oriented to person, place, and time. He has normal strength and normal reflexes. No cranial nerve deficit or sensory deficit. Gait normal.  Skin: Skin is warm and dry. No rash noted. No cyanosis. Nails show no clubbing.  Psychiatric: His speech is normal. Thought content normal. Cognition and memory are normal.  Patient is reluctant to engage.    CMP     Component Value Date/Time   NA 139 05/19/2009 1250   K 4.5 05/19/2009 1250   CL 103 05/19/2009 1250   CO2 29 05/19/2009 1250   GLUCOSE 147 (H) 05/19/2009 1250   BUN 17 05/19/2009 1250   CREATININE 1.43 05/19/2009 1250   CALCIUM 9.7 05/19/2009 1250   PROT 7.6 05/19/2009 1250   ALBUMIN 3.8 05/19/2009 1250   AST 17 05/19/2009 1250   ALT 14 05/19/2009 1250   ALKPHOS 31 (L) 05/19/2009 1250   BILITOT 0.4 05/19/2009 1250   GFRNONAA 51 (L) 05/19/2009 1250   GFRAA  05/19/2009 1250    >60        The eGFR has been calculated using the MDRD equation. This calculation has  not been validated in all clinical situations. eGFR's persistently <60 mL/min signify possible Chronic Kidney Disease.      Assessment & Plan:   1. Uncontrolled type 2 diabetes mellitus with complication, without long-term current use of insulin ([place])   - Patient has currently uncontrolled symptomatic type 2 DM since  65 years of age,  with most recent A1c of 9 %, he did have A1c as high as 11% last year. - He does not have recent labs to review.  His diabetes is complicated by obesity/sedentary life and patient remains at a high risk for more acute and chronic complications of diabetes which include CAD, CVA, CKD, retinopathy, and neuropathy. These are all discussed in detail with the  patient.  - I have counseled the patient on diet management and weight loss, by adopting a carbohydrate restricted/protein rich diet.  - Suggestion is made for patient to avoid simple carbohydrates   from their diet including Cakes , Desserts, Ice Cream,  Soda (  diet and regular) , Sweet Tea , Candies,  Chips, Cookies, Artificial Sweeteners,   and "Sugar-free" Products . This will help patient to have stable blood glucose profile and potentially avoid unintended weight gain.  - I encouraged the patient to switch to  unprocessed or minimally processed complex starch and increased protein intake (animal or plant source), fruits, and vegetables.  - Patient is advised to stick to a routine mealtimes to eat 3 meals  a day and avoid unnecessary snacks ( to snack only to correct hypoglycemia).  - The patient will be scheduled with Jearld Fenton, RDN, CDE for individualized DM education.  - I have approached patient with the following individualized plan to manage diabetes and patient agrees:   - Patient is very hesitant to engage in self-care saying that he is too busy/troubling. -  However he reluctantly accepts to initiate strict monitoring of glucose  AC and HS, and return in one week with meter and logs for reevaluation. - Will likely require at least basal insulin after his commitment is short for monitoring and safe use of insulin.  -Patient is encouraged to call clinic for blood glucose levels less than 70 or above 300 mg /dl. - I will continue  Invokamet 150/1000mg by mouth daily, and Januvia 100 mg by mouth daily, therapeutically suitable for patient.   - Patient specific target  A1c;  LDL, HDL, Triglycerides, and  Waist Circumference were discussed in detail.  2) BP/HTN: Controlled. Continue current medications including ACEI/ARB. 3) Lipids/HPL:  Control unknown,  continue statins. 4)  Weight/Diet: CDE Consult will be initiated , exercise, and detailed carbohydrates information  provided.  5) Chronic Care/Health Maintenance:  -Patient is on ACEI/ARB and Statin medications and encouraged to continue to follow up with Ophthalmology, Podiatrist at least yearly or according to recommendations, and advised to   stay away from smoking. I have recommended yearly flu vaccine and pneumonia vaccination at least every 5 years; moderate intensity exercise for up to 150 minutes weekly; and  sleep for at least 7 hours a day.  - 60 minutes of time was spent on the care of this patient , 50% of which was applied for counseling on diabetes complications and their preventions.  - Patient to bring meter and  blood glucose logs during their next visit.   - I advised patient to maintain close follow up with Delman Cheadle, PA-C for primary care needs.  Follow up plan: - Return in about 5 weeks (around 04/05/2016) for follow  up with meter and logs- no labs.  Glade Lloyd, MD Phone: [phone]  Fax: [phone]   03/01/2016, 4:15 PM

## 2016-03-16 ENCOUNTER — Ambulatory Visit (INDEPENDENT_AMBULATORY_CARE_PROVIDER_SITE_OTHER): Payer: Medicare Other | Admitting: "Endocrinology

## 2016-03-16 ENCOUNTER — Encounter: Payer: Self-pay | Admitting: "Endocrinology

## 2016-03-16 ENCOUNTER — Encounter: Payer: BLUE CROSS/BLUE SHIELD | Attending: "Endocrinology | Admitting: Nutrition

## 2016-03-16 VITALS — Ht 71.0 in | Wt 220.0 lb

## 2016-03-16 VITALS — BP 135/78 | HR 69 | Ht 71.0 in | Wt 220.0 lb

## 2016-03-16 DIAGNOSIS — E7800 Pure hypercholesterolemia, unspecified: Secondary | ICD-10-CM

## 2016-03-16 DIAGNOSIS — E1165 Type 2 diabetes mellitus with hyperglycemia: Secondary | ICD-10-CM | POA: Insufficient documentation

## 2016-03-16 DIAGNOSIS — E118 Type 2 diabetes mellitus with unspecified complications: Secondary | ICD-10-CM | POA: Diagnosis not present

## 2016-03-16 DIAGNOSIS — Z713 Dietary counseling and surveillance: Secondary | ICD-10-CM | POA: Insufficient documentation

## 2016-03-16 DIAGNOSIS — IMO0002 Reserved for concepts with insufficient information to code with codable children: Secondary | ICD-10-CM

## 2016-03-16 DIAGNOSIS — E669 Obesity, unspecified: Secondary | ICD-10-CM

## 2016-03-16 DIAGNOSIS — I10 Essential (primary) hypertension: Secondary | ICD-10-CM | POA: Diagnosis not present

## 2016-03-16 MED ORDER — SIMVASTATIN 40 MG PO TABS: 40.0000 mg | ORAL_TABLET | Freq: Every day | ORAL | 3 refills | Status: DC

## 2016-03-16 MED ORDER — CANAGLIFLOZIN-METFORMIN HCL 150-1000 MG PO TABS: 1.0000 | ORAL_TABLET | Freq: Two times a day (BID) | ORAL | 3 refills | Status: DC

## 2016-03-16 MED ORDER — CANAGLIFLOZIN-METFORMIN HCL ER 150-1000 MG PO TB24: 1.0000 | ORAL_TABLET | Freq: Every day | ORAL | 3 refills | Status: DC

## 2016-03-16 MED ORDER — LISINOPRIL 10 MG PO TABS: 10.0000 mg | ORAL_TABLET | Freq: Every day | ORAL | 3 refills | Status: DC

## 2016-03-16 MED ORDER — FENOFIBRATE 145 MG PO TABS: 145.0000 mg | ORAL_TABLET | Freq: Every day | ORAL | 3 refills | Status: DC

## 2016-03-16 MED ORDER — FENOFIBRATE 145 MG PO TABS
145.0000 mg | ORAL_TABLET | Freq: Every day | ORAL | 3 refills | Status: DC
Start: 2016-03-16 — End: 2016-07-26

## 2016-03-16 MED ORDER — FISH OIL 1000 MG PO CAPS: 1000.0000 mg | ORAL_CAPSULE | Freq: Two times a day (BID) | ORAL | 3 refills | Status: DC

## 2016-03-16 MED ORDER — SITAGLIPTIN PHOSPHATE 100 MG PO TABS: 100.0000 mg | ORAL_TABLET | Freq: Every day | ORAL | 3 refills | Status: DC

## 2016-03-16 NOTE — Progress Notes (Signed)
  Medical Nutrition Therapy:  Appt start time: 0800 end time:  0900.  Assessment:  Primary concerns today:  Diabetes Type 2. LIves with his wife. 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 Invokament and Januvia. Testing blood sugars  FBS 114-168 before lunch 92-182 mg/dl and before dinner 105-172 mg/dl and bedtime 91-175 mg/dl   Avg bs 134 mg/dl.  He knows exactly what makes his BS go up when it's up and he now sees the impact of various foods. Had met with an RD long time ago but he didn't feel it was helpful at the time.  Lab Results  Component Value Date   HGBA1C 9 12/22/2015   Wt Readings from Last 3 Encounters:  03/16/16 220 lb (99.8 kg)  03/01/16 222 lb (100.7 kg)  08/30/12 260 lb (117.9 kg)   Ht Readings from Last 3 Encounters:  03/16/16 '5\' 11"'$  (1.803 m)  03/01/16 '5\' 11"'$  (1.803 m)  08/30/12 '5\' 11"'$  (1.803 m)   Body mass index is 30.68 kg/m.   He notes he has been testing 4 times per day for Dr. Dorris Fetch. Will see Dr. Dorris Fetch later today for follow up. He has been trying to eat much better and making better food choices but doesn't really know what exactly he should be eating.  Compliant with medications.  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   Preferred Learning Style:   Auditory  Visual  Hands on  Learning Readiness: None  Ready  Change in progress  MEDICATIONS: See list   DIETARY INTAKE:  24-hr recall:  B ( AM):Corflakes or cherrios or egg and bacon sandwich,  Water, coffee Snk ( AM):   L ( PM): Vegetable beef soup water and 6 crackers,  Snk ( PM): pickle D ( PM):  Chicken pot pie, corn, black eyed peas, water Snk ( PM): none Beverages: water  Usual physical activity: walks some  Estimated energy needs: 1800  calories 200 g carbohydrates 135 g protein 50 g fat  Progress Towards Goal(s):  In  progress.   Nutritional Diagnosis:  NB-1.1 Food and nutrition-related knowledge deficit As related to Diabetes.  As evidenced by A1C 9%.    Intervention:  Nutrition and Diabetes education provided on My Plate, CHO counting, meal planning, portion sizes, timing of meals, avoiding snacks between meals unless having a low blood sugar, target ranges for A1C and blood sugars, signs/symptoms and treatment of hyper/hypoglycemia, monitoring blood sugars, taking medications as prescribed, benefits of exercising 30 minutes per day and prevention of complications of DM.  Goals 1. Follow MY Plate 2. Increase fresh fruits, whole grains and fresh vegetables 3. Cut out snacks between meals unless it's veggies or walnuts/almonds 4. Eat 3-4 carb choices per meal 5. Keep drinking water 6. Exercise 30-60 minutes 4-5 times per week Lose 1 lb per week Take meds as prescribed  Keep up the good work!! You're doing great!!   Teaching Method Utilized:  Visual Auditory Hands on  Handouts given during visit include:  The Plate Method  Meal Plan Card  Diabetes Instructions.   Barriers to learning/adherence to lifestyle change: None  Demonstrated degree of understanding via:  Teach Back   Monitoring/Evaluation:  Dietary intake, exercise, meal planning, SBG, and body weight in 3 month(s).

## 2016-03-16 NOTE — Progress Notes (Signed)
Subjective:    Patient ID: Javier Walker, male    DOB: 25-Apr-1950. Patient is being seen in f/u for management of diabetes requested by  Jana Half  Past Medical History:  Diagnosis Date  . Diabetes mellitus without complication ([place])   . GERD (gastroesophageal reflux disease)    occ-no meds  . Hyperlipemia   . Hypertension   . Shortness of breath    Past Surgical History:  Procedure Laterality Date  . COLON SURGERY  2011   gangrene lower colon with colostomy  . COLOSTOMY REVERSAL  2011  . OPEN REDUCTION INTERNAL FIXATION (ORIF) TIBIA/FIBULA FRACTURE Right 08/30/2012   Procedure: OPEN REDUCTION INTERNAL FIXATION (ORIF) TIBIA/FIBULA FRACTURE;  Surgeon: Wylene Simmer, MD;  Location: [facility];  Service: Orthopedics;  Laterality: Right;  . WOUND DEBRIDEMENT  2011   abd-   Social History   Social History  . Marital status: Married    Spouse name: N/A  . Number of children: N/A  . Years of education: N/A   Social History Main Topics  . Smoking status: Former Smoker    Quit date: 08/27/1988  . Smokeless tobacco: Never Used  . Alcohol use Yes  . Drug use: No  . Sexual activity: Not Asked   Other Topics Concern  . None   Social History Narrative  . None   Outpatient Encounter Prescriptions as of 03/16/2016  Medication Sig  . aspirin 81 MG chewable tablet Chew by mouth daily.  . Canagliflozin-Metformin HCl (INVOKAMET) 225 139 5639 MG TABS Take 1 tablet by mouth 2 (two) times daily after a meal.  . fenofibrate (TRICOR) 145 MG tablet Take 1 tablet (145 mg total) by mouth daily.  [name] lisinopril (PRINIVIL,ZESTRIL) 10 MG tablet Take 1 tablet (10 mg total) by mouth daily.  . Omega-3 Fatty Acids (FISH OIL) 1000 MG CAPS Take 1 capsule (1,000 mg total) by mouth 2 (two) times daily.  . simvastatin (ZOCOR) 40 MG tablet Take 1 tablet (40 mg total) by mouth daily.  . sitaGLIPtin (JANUVIA) 100 MG tablet Take 1 tablet (100 mg total) by mouth daily.  .  [DISCONTINUED] Canagliflozin-Metformin HCl (INVOKAMET) 225 139 5639 MG TABS Take by mouth daily.  . [DISCONTINUED] fenofibrate (TRICOR) 145 MG tablet Take 145 mg by mouth daily.  . [DISCONTINUED] lisinopril (PRINIVIL,ZESTRIL) 10 MG tablet Take 10 mg by mouth daily.  . [DISCONTINUED] Omega-3 Fatty Acids (FISH OIL) 1000 MG CAPS Take 1,000 mg by mouth 2 (two) times daily.  . [DISCONTINUED] simvastatin (ZOCOR) 40 MG tablet Take 40 mg by mouth daily.  . [DISCONTINUED] sitaGLIPtin (JANUVIA) 100 MG tablet Take 100 mg by mouth daily.   No facility-administered encounter medications on file as of 03/16/2016.    ALLERGIES: No Known Allergies VACCINATION STATUS:  There is no immunization history on file for this patient.  Diabetes  He presents for his follow-up diabetic visit. He has type 2 diabetes mellitus. Onset time: He was diagnosed at approximate age of 29 years. His disease course has been improving. There are no hypoglycemic associated symptoms. Pertinent negatives for hypoglycemia include no confusion, headaches, pallor or seizures. Associated symptoms include blurred vision. Pertinent negatives for diabetes include no chest pain, no fatigue, no polydipsia, no polyphagia, no polyuria and no weakness. There are no hypoglycemic complications. Symptoms are improving. There are no diabetic complications. Risk factors for coronary artery disease include diabetes mellitus, dyslipidemia, hypertension, male sex, obesity, tobacco exposure and sedentary lifestyle. Current diabetic treatment includes oral agent (dual therapy). His weight is stable.  He is following a generally unhealthy diet. When asked about meal planning, he reported none. He has not had a previous visit with a dietitian. He never participates in exercise. His breakfast blood glucose range is generally 140-180 mg/dl. His lunch blood glucose range is generally 140-180 mg/dl. His dinner blood glucose range is generally 140-180 mg/dl. His overall blood  glucose range is 140-180 mg/dl. An ACE inhibitor/angiotensin II receptor blocker is being taken. Eye exam is current.  Hyperlipidemia  This is a chronic problem. The current episode started more than 1 year ago. Pertinent negatives include no chest pain, myalgias or shortness of breath. Current antihyperlipidemic treatment includes statins. Risk factors for coronary artery disease include diabetes mellitus, dyslipidemia, hypertension, male sex, obesity and a sedentary lifestyle.  Hypertension  This is a chronic problem. The current episode started more than 1 year ago. The problem is controlled. Associated symptoms include blurred vision. Pertinent negatives include no chest pain, headaches, neck pain, palpitations or shortness of breath. Risk factors for coronary artery disease include dyslipidemia, obesity, male gender, sedentary lifestyle and smoking/tobacco exposure. Past treatments include ACE inhibitors.     Review of Systems  Constitutional: Positive for unexpected weight change. Negative for chills, fatigue and fever.  HENT: Negative for dental problem, mouth sores and trouble swallowing.   Eyes: Positive for blurred vision. Negative for visual disturbance.  Respiratory: Negative for cough, choking, chest tightness, shortness of breath and wheezing.   Cardiovascular: Negative for chest pain, palpitations and leg swelling.  Gastrointestinal: Negative for abdominal distention, abdominal pain, constipation, diarrhea, nausea and vomiting.  Endocrine: Negative for polydipsia, polyphagia and polyuria.  Genitourinary: Negative for dysuria, flank pain, hematuria and urgency.  Musculoskeletal: Negative for back pain, gait problem, myalgias and neck pain.  Skin: Negative for pallor, rash and wound.  Neurological: Negative for seizures, syncope, weakness, numbness and headaches.  Psychiatric/Behavioral: Negative.  Negative for confusion and dysphoric mood.    Objective:    BP 135/78   Pulse 69    Ht _0  (1.803 m)   Wt 220 lb (99.8 kg)   BMI 30.68 kg/m   Wt Readings from Last 3 Encounters:  03/16/16 220 lb (99.8 kg)  03/16/16 220 lb (99.8 kg)  03/01/16 222 lb (100.7 kg)    Physical Exam  Constitutional: He is oriented to person, place, and time. He appears well-developed. He is cooperative. No distress.  HENT:  Head: Normocephalic and atraumatic.  Eyes: EOM are normal.  Neck: Normal range of motion. Neck supple. No tracheal deviation present. No thyromegaly present.  Cardiovascular: Normal rate, S1 normal, S2 normal and normal heart sounds.  Exam reveals no gallop.   No murmur heard. Pulses:      Dorsalis pedis pulses are 1+ on the right side, and 1+ on the left side.       Posterior tibial pulses are 1+ on the right side, and 1+ on the left side.  Pulmonary/Chest: Breath sounds normal. No respiratory distress. He has no wheezes.  Abdominal: Soft. Bowel sounds are normal. He exhibits no distension. There is no tenderness. There is no guarding and no CVA tenderness.  Musculoskeletal: He exhibits no edema.       Right shoulder: He exhibits no swelling and no deformity.  Neurological: He is alert and oriented to person, place, and time. He has normal strength and normal reflexes. No cranial nerve deficit or sensory deficit. Gait normal.  Skin: Skin is warm and dry. No rash noted. No cyanosis. Nails show no  clubbing.  Psychiatric: His speech is normal. Thought content normal. Cognition and memory are normal.  Patient is reluctant to engage.    CMP     Component Value Date/Time   NA 139 05/19/2009 1250   K 4.5 05/19/2009 1250   CL 103 05/19/2009 1250   CO2 29 05/19/2009 1250   GLUCOSE 147 (H) 05/19/2009 1250   BUN 17 05/19/2009 1250   CREATININE 1.43 05/19/2009 1250   CALCIUM 9.7 05/19/2009 1250   PROT 7.6 05/19/2009 1250   ALBUMIN 3.8 05/19/2009 1250   AST 17 05/19/2009 1250   ALT 14 05/19/2009 1250   ALKPHOS 31 (L) 05/19/2009 1250   BILITOT 0.4 05/19/2009 1250    GFRNONAA 51 (L) 05/19/2009 1250   GFRAA  05/19/2009 1250    >60        The eGFR has been calculated using the MDRD equation. This calculation has not been validated in all clinical situations. eGFR's persistently <60 mL/min signify possible Chronic Kidney Disease.      Assessment & Plan:   1. Uncontrolled type 2 diabetes mellitus with complication, without long-term current use of insulin ([place])   - Patient has currently uncontrolled symptomatic type 2 DM since  65 years of age,  with most recent A1c of 9 %, he did have A1c as high as 11% last year. - He does not have recent labs to review.  His diabetes is complicated by obesity/sedentary life and patient remains at a high risk for more acute and chronic complications of diabetes which include CAD, CVA, CKD, retinopathy, and neuropathy. These are all discussed in detail with the patient.  - I have counseled the patient on diet management and weight loss, by adopting a carbohydrate restricted/protein rich diet.  - Suggestion is made for patient to avoid simple carbohydrates   from their diet including Cakes , Desserts, Ice Cream,  Soda (  diet and regular) , Sweet Tea , Candies,  Chips, Cookies, Artificial Sweeteners,   and "Sugar-free" Products . This will help patient to have stable blood glucose profile and potentially avoid unintended weight gain.  - I encouraged the patient to switch to  unprocessed or minimally processed complex starch and increased protein intake (animal or plant source), fruits, and vegetables.  - Patient is advised to stick to a routine mealtimes to eat 3 meals  a day and avoid unnecessary snacks ( to snack only to correct hypoglycemia).  - The patient will be scheduled with Jearld Fenton, RDN, CDE for individualized DM education.  - I have approached patient with the following individualized plan to manage diabetes and patient agrees:   - Patient  Came with 95% on target readings.  -  he is motivated  to avoid insulin treatment. - He will not require insulin treatment at this time .  - I will continue  Invokamet 150/1000mg by mouth daily, and Januvia 100 mg by mouth daily, therapeutically suitable for patient.   - Patient specific target  A1c;  LDL, HDL, Triglycerides, and  Waist Circumference were discussed in detail.  2) BP/HTN: Controlled. Continue current medications including ACEI/ARB. 3) Lipids/HPL:  Control unknown,  continue statins. 4)  Weight/Diet: CDE Consult in progress  , exercise, and detailed carbohydrates information provided.  5) Chronic Care/Health Maintenance:  -Patient is on ACEI/ARB and Statin medications and encouraged to continue to follow up with Ophthalmology, Podiatrist at least yearly or according to recommendations, and advised to   stay away from smoking. I have recommended yearly  flu vaccine and pneumonia vaccination at least every 5 years; moderate intensity exercise for up to 150 minutes weekly; and  sleep for at least 7 hours a day.  - 30 minutes of time was spent on the care of this patient , 50% of which was applied for counseling on diabetes complications and their preventions.  - Patient to bring meter and  blood glucose logs during their next visit.   - I advised patient to maintain close follow up with Delman Cheadle, PA-C for primary care needs.  Follow up plan: - Return in about 7 weeks (around 05/04/2016) for follow up with pre-visit labs.  Glade Lloyd, MD Phone: [phone]  Fax: [phone]   03/16/2016, 4:04 PM

## 2016-03-16 NOTE — Patient Instructions (Signed)
Goals 1. Follow MY Plate 2. Increase fresh fruits, whole grains and fresh vegetables 3. Cut out snacks between meals unless it's veggies or walnuts/almonds 4. Eat 3-4 carb choices per meal 5. Keep drinking water 6. Exercise 30-60 minutes 4-5 times per week Lose 1 lb per week Take meds as prescribed  Keep up the good work!! You're doing great!!

## 2016-03-16 NOTE — Addendum Note (Signed)
Addended by: Jannifer FranklinFRENCH, KIMBERLY A on: 03/16/2016 04:18 PM   Modules accepted: Orders

## 2016-04-26 ENCOUNTER — Other Ambulatory Visit: Payer: Self-pay | Admitting: "Endocrinology

## 2016-04-26 DIAGNOSIS — E118 Type 2 diabetes mellitus with unspecified complications: Secondary | ICD-10-CM | POA: Diagnosis not present

## 2016-04-26 DIAGNOSIS — E1165 Type 2 diabetes mellitus with hyperglycemia: Secondary | ICD-10-CM | POA: Diagnosis not present

## 2016-04-26 DIAGNOSIS — E7800 Pure hypercholesterolemia, unspecified: Secondary | ICD-10-CM | POA: Diagnosis not present

## 2016-04-26 LAB — COMPREHENSIVE METABOLIC PANEL
ALT: 18 U/L (ref 9–46)
AST: 26 U/L (ref 10–35)
Albumin: 3.8 g/dL (ref 3.6–5.1)
Alkaline Phosphatase: 35 U/L — ABNORMAL LOW (ref 40–115)
BILIRUBIN TOTAL: 0.6 mg/dL (ref 0.2–1.2)
BUN: 15 mg/dL (ref 7–25)
CALCIUM: 9.4 mg/dL (ref 8.6–10.3)
CHLORIDE: 105 mmol/L (ref 98–110)
CO2: 25 mmol/L (ref 20–31)
Creat: 1.37 mg/dL — ABNORMAL HIGH (ref 0.70–1.25)
GLUCOSE: 133 mg/dL — AB (ref 65–99)
Potassium: 4.5 mmol/L (ref 3.5–5.3)
SODIUM: 138 mmol/L (ref 135–146)
Total Protein: 7.2 g/dL (ref 6.1–8.1)

## 2016-04-26 LAB — LIPID PANEL
Cholesterol: 150 mg/dL (ref <200)
HDL: 27 mg/dL — AB (ref >40)
LDL CALC: 85 mg/dL (ref <100)
TRIGLYCERIDES: 189 mg/dL — AB (ref <150)
Total CHOL/HDL Ratio: 5.6 Ratio — ABNORMAL HIGH (ref <5.0)
VLDL: 38 mg/dL — AB (ref <30)

## 2016-04-27 LAB — MICROALBUMIN / CREATININE URINE RATIO
Creatinine, Urine: 113 mg/dL (ref 20–370)
MICROALB UR: 0.8 mg/dL
Microalb Creat Ratio: 7 mcg/mg creat (ref <30)

## 2016-04-27 LAB — HEMOGLOBIN A1C
HEMOGLOBIN A1C: 6.7 % — AB (ref <5.7)
Mean Plasma Glucose: 146 mg/dL

## 2016-05-05 ENCOUNTER — Ambulatory Visit (INDEPENDENT_AMBULATORY_CARE_PROVIDER_SITE_OTHER): Payer: BLUE CROSS/BLUE SHIELD | Admitting: "Endocrinology

## 2016-05-05 ENCOUNTER — Encounter: Payer: Self-pay | Admitting: "Endocrinology

## 2016-05-05 VITALS — BP 119/71 | HR 75 | Ht 71.0 in | Wt 215.0 lb

## 2016-05-05 DIAGNOSIS — I10 Essential (primary) hypertension: Secondary | ICD-10-CM | POA: Diagnosis not present

## 2016-05-05 DIAGNOSIS — E7800 Pure hypercholesterolemia, unspecified: Secondary | ICD-10-CM | POA: Diagnosis not present

## 2016-05-05 DIAGNOSIS — E118 Type 2 diabetes mellitus with unspecified complications: Secondary | ICD-10-CM

## 2016-05-05 DIAGNOSIS — E1165 Type 2 diabetes mellitus with hyperglycemia: Secondary | ICD-10-CM | POA: Diagnosis not present

## 2016-05-05 DIAGNOSIS — IMO0002 Reserved for concepts with insufficient information to code with codable children: Secondary | ICD-10-CM

## 2016-05-05 MED ORDER — CANAGLIFLOZIN-METFORMIN HCL ER 150-1000 MG PO TB24: 1.0000 | ORAL_TABLET | Freq: Every day | ORAL | 3 refills | Status: DC

## 2016-05-05 MED ORDER — SIMVASTATIN 40 MG PO TABS: 40.0000 mg | ORAL_TABLET | Freq: Every day | ORAL | 3 refills | Status: DC

## 2016-05-05 NOTE — Progress Notes (Signed)
Subjective:    Patient ID: Javier Walker, male    DOB: [birth year]. Patient is being seen in f/u for management of diabetes requested by  Pershing Proud  Past Medical History:  Diagnosis Date  . Diabetes mellitus without complication ([facility])   . GERD (gastroesophageal reflux disease)    occ-no meds  . Hyperlipemia   . Hypertension   . Shortness of breath    Past Surgical History:  Procedure Laterality Date  . COLON SURGERY  2011   gangrene lower colon with colostomy  . COLOSTOMY REVERSAL  2011  . OPEN REDUCTION INTERNAL FIXATION (ORIF) TIBIA/FIBULA FRACTURE Right 08/30/2012   Procedure: OPEN REDUCTION INTERNAL FIXATION (ORIF) TIBIA/FIBULA FRACTURE;  Surgeon: Toni Arthurs, MD;  Location: [facility];  Service: Orthopedics;  Laterality: Right;  . WOUND DEBRIDEMENT  2011   abd-   Social History   Social History  . Marital status: Married    Spouse name: N/A  . Number of children: N/A  . Years of education: N/A   Social History Main Topics  . Smoking status: Former Smoker    Quit date: 08/27/1988  . Smokeless tobacco: Never Used  . Alcohol use Yes  . Drug use: No  . Sexual activity: Not Asked   Other Topics Concern  . None   Social History Narrative  . None   Outpatient Encounter Prescriptions as of 05/05/2016  Medication Sig  . aspirin 81 MG chewable tablet Chew by mouth daily.  . Canagliflozin-Metformin HCl ER (INVOKAMET XR) [id number] MG TB24 Take 1 tablet by mouth daily with breakfast.  . fenofibrate (TRICOR) 145 MG tablet Take 1 tablet (145 mg total) by mouth daily.  [name] lisinopril (PRINIVIL,ZESTRIL) 10 MG tablet Take 1 tablet (10 mg total) by mouth daily.  . Omega-3 Fatty Acids (FISH OIL) 1000 MG CAPS Take 1 capsule (1,000 mg total) by mouth 2 (two) times daily.  . simvastatin (ZOCOR) 40 MG tablet Take 1 tablet (40 mg total) by mouth daily.  . [DISCONTINUED] Canagliflozin-Metformin HCl ER (INVOKAMET XR) [id number] MG TB24 Take 1 tablet by mouth  daily with breakfast.  . [DISCONTINUED] simvastatin (ZOCOR) 40 MG tablet Take 1 tablet (40 mg total) by mouth daily.  . [DISCONTINUED] sitaGLIPtin (JANUVIA) 100 MG tablet Take 1 tablet (100 mg total) by mouth daily.   No facility-administered encounter medications on file as of 05/05/2016.    ALLERGIES: No Known Allergies VACCINATION STATUS:  There is no immunization history on file for this patient.  Diabetes  He presents for his follow-up diabetic visit. He has type 2 diabetes mellitus. Onset time: He was diagnosed at approximate age of 58 years. His disease course has been improving. There are no hypoglycemic associated symptoms. Pertinent negatives for hypoglycemia include no confusion, headaches, pallor or seizures. Associated symptoms include blurred vision. Pertinent negatives for diabetes include no chest pain, no fatigue, no polydipsia, no polyphagia, no polyuria and no weakness. There are no hypoglycemic complications. Symptoms are improving. There are no diabetic complications. Risk factors for coronary artery disease include diabetes mellitus, dyslipidemia, hypertension, male sex, obesity, tobacco exposure and sedentary lifestyle. Current diabetic treatment includes oral agent (dual therapy). His weight is decreasing steadily. He is following a generally unhealthy diet. When asked about meal planning, he reported none. He has not had a previous visit with a dietitian. He never participates in exercise. An ACE inhibitor/angiotensin II receptor blocker is being taken. Eye exam is current.  Hyperlipidemia  This is a chronic problem. The current  episode started more than 1 year ago. Pertinent negatives include no chest pain, myalgias or shortness of breath. Current antihyperlipidemic treatment includes statins. Risk factors for coronary artery disease include diabetes mellitus, dyslipidemia, hypertension, male sex, obesity and a sedentary lifestyle.  Hypertension  This is a chronic problem.  The current episode started more than 1 year ago. The problem is controlled. Associated symptoms include blurred vision. Pertinent negatives include no chest pain, headaches, neck pain, palpitations or shortness of breath. Risk factors for coronary artery disease include dyslipidemia, obesity, male gender, sedentary lifestyle and smoking/tobacco exposure. Past treatments include ACE inhibitors.   Review of Systems  Constitutional: Positive for unexpected weight change. Negative for chills, fatigue and fever.  HENT: Negative for dental problem, mouth sores and trouble swallowing.   Eyes: Positive for blurred vision. Negative for visual disturbance.  Respiratory: Negative for cough, choking, chest tightness, shortness of breath and wheezing.   Cardiovascular: Negative for chest pain, palpitations and leg swelling.  Gastrointestinal: Negative for abdominal distention, abdominal pain, constipation, diarrhea, nausea and vomiting.  Endocrine: Negative for polydipsia, polyphagia and polyuria.  Genitourinary: Negative for dysuria, flank pain, hematuria and urgency.  Musculoskeletal: Negative for back pain, gait problem, myalgias and neck pain.  Skin: Negative for pallor, rash and wound.  Neurological: Negative for seizures, syncope, weakness, numbness and headaches.  Psychiatric/Behavioral: Negative.  Negative for confusion and dysphoric mood.    Objective:    BP 119/71   Pulse 75   Ht 5\' 11"  (1.803 m)   Wt 215 lb (97.5 kg)   BMI 29.99 kg/m   Wt Readings from Last 3 Encounters:  05/05/16 215 lb (97.5 kg)  03/16/16 220 lb (99.8 kg)  03/16/16 220 lb (99.8 kg)    Physical Exam  Constitutional: He is oriented to person, place, and time. He appears well-developed. He is cooperative. No distress.  HENT:  Head: Normocephalic and atraumatic.  Eyes: EOM are normal.  Neck: Normal range of motion. Neck supple. No tracheal deviation present. No thyromegaly present.  Cardiovascular: Normal rate, S1  normal, S2 normal and normal heart sounds.  Exam reveals no gallop.   No murmur heard. Pulses:      Dorsalis pedis pulses are 1+ on the right side, and 1+ on the left side.       Posterior tibial pulses are 1+ on the right side, and 1+ on the left side.  Pulmonary/Chest: Breath sounds normal. No respiratory distress. He has no wheezes.  Abdominal: Soft. Bowel sounds are normal. He exhibits no distension. There is no tenderness. There is no guarding and no CVA tenderness.  Musculoskeletal: He exhibits no edema.       Right shoulder: He exhibits no swelling and no deformity.  Neurological: He is alert and oriented to person, place, and time. He has normal strength and normal reflexes. No cranial nerve deficit or sensory deficit. Gait normal.  Skin: Skin is warm and dry. No rash noted. No cyanosis. Nails show no clubbing.  Psychiatric: His speech is normal. Thought content normal. Cognition and memory are normal.  Patient is reluctant to engage.   Recent Results (from the past 2160 hour(s))  Microalbumin / creatinine urine ratio     Status: None   Collection Time: 04/26/16  9:36 AM  Result Value Ref Range   Creatinine, Urine 113 20 - 370 mg/dL   Microalb, Ur 0.8 Not estab mg/dL   Microalb Creat Ratio 7 <30 mcg/mg creat    Comment: The ADA has defined abnormalities  in albumin excretion as follows:           Category           Result                            (mcg/mg creatinine)                 Normal:    <30       Microalbuminuria:    30 - 299   Clinical albuminuria:    > or = 300   The ADA recommends that at least two of three specimens collected within a 3 - 6 month period be abnormal before considering a patient to be within a diagnostic category.     Comprehensive metabolic panel     Status: Abnormal   Collection Time: 04/26/16  9:36 AM  Result Value Ref Range   Sodium 138 135 - 146 mmol/L   Potassium 4.5 3.5 - 5.3 mmol/L   Chloride 105 98 - 110 mmol/L   CO2 25 20 - 31 mmol/L    Glucose, Bld 133 (H) 65 - 99 mg/dL   BUN 15 7 - 25 mg/dL   Creat 1.61 (H) 0.96 - 1.25 mg/dL    Comment:   For patients > or = 66 years of age: The upper reference limit for Creatinine is approximately 13% higher for people identified as African-American.      Total Bilirubin 0.6 0.2 - 1.2 mg/dL   Alkaline Phosphatase 35 (L) 40 - 115 U/L   AST 26 10 - 35 U/L   ALT 18 9 - 46 U/L   Total Protein 7.2 6.1 - 8.1 g/dL   Albumin 3.8 3.6 - 5.1 g/dL   Calcium 9.4 8.6 - 04.5 mg/dL  Lipid panel     Status: Abnormal   Collection Time: 04/26/16  9:36 AM  Result Value Ref Range   Cholesterol 150 <200 mg/dL   Triglycerides 409 (H) <150 mg/dL   HDL 27 (L) >81 mg/dL   Total CHOL/HDL Ratio 5.6 (H) <5.0 Ratio   VLDL 38 (H) <30 mg/dL   LDL Cholesterol 85 <191 mg/dL  Hemoglobin Y7W     Status: Abnormal   Collection Time: 04/26/16  9:36 AM  Result Value Ref Range   Hgb A1c MFr Bld 6.7 (H) <5.7 %    Comment:   For someone without known diabetes, a hemoglobin A1c value of 6.5% or greater indicates that they may have diabetes and this should be confirmed with a follow-up test.   For someone with known diabetes, a value <7% indicates that their diabetes is well controlled and a value greater than or equal to 7% indicates suboptimal control. A1c targets should be individualized based on duration of diabetes, age, comorbid conditions, and other considerations.   Currently, no consensus exists for use of hemoglobin A1c for diagnosis of diabetes for children.      Mean Plasma Glucose 146 mg/dL       Assessment & Plan:   1. Uncontrolled type 2 diabetes mellitus with complication, without long-term current use of insulin ([facility])   - Patient has currently uncontrolled symptomatic type 2 DM since  66 years of age,  with most recent A1c of  6.7% <- 9 %, <- 11%.  - He does not have recent labs to review.  His diabetes is complicated by obesity/sedentary life and patient remains at a high risk  for more acute  and chronic complications of diabetes which include CAD, CVA, CKD, retinopathy, and neuropathy. These are all discussed in detail with the patient.  - I have counseled the patient on diet management and weight loss, by adopting a carbohydrate restricted/protein rich diet.  - Suggestion is made for patient to avoid simple carbohydrates   from their diet including Cakes , Desserts, Ice Cream,  Soda (  diet and regular) , Sweet Tea , Candies,  Chips, Cookies, Artificial Sweeteners,   and "Sugar-free" Products . This will help patient to have stable blood glucose profile and potentially avoid unintended weight gain.  - I encouraged the patient to switch to  unprocessed or minimally processed complex starch and increased protein intake (animal or plant source), fruits, and vegetables.  - Patient is advised to stick to a routine mealtimes to eat 3 meals  a day and avoid unnecessary snacks ( to snack only to correct hypoglycemia).  - The patient will be scheduled with Norm SaltPenny Crumpton, RDN, CDE for individualized DM education.  - I have approached patient with the following individualized plan to manage diabetes and patient agrees:    -  He Continued to be motivated to avoid insulin treatment. - He will not require insulin treatment at this time .  - I will continue  Invokamet 150/1000mg  by mouth daily, and d/c Januvia .  - Patient specific target  A1c;  LDL, HDL, Triglycerides, and  Waist Circumference were discussed in detail.  2) BP/HTN: Controlled. Continue current medications including ACEI/ARB. 3) Lipids/HPL:  Controlled LDL at 85,  continue statins. 4)  Weight/Diet: CDE Consult in progress  , exercise, and detailed carbohydrates information provided.  5) Chronic Care/Health Maintenance:  -Patient is on ACEI/ARB and Statin medications and encouraged to continue to follow up with Ophthalmology, Podiatrist at least yearly or according to recommendations, and advised to   stay  away from smoking. I have recommended yearly flu vaccine and pneumonia vaccination at least every 5 years; moderate intensity exercise for up to 150 minutes weekly; and  sleep for at least 7 hours a day.  - 30 minutes of time was spent on the care of this patient , 50% of which was applied for counseling on diabetes complications and their preventions.  - Patient to bring meter and  blood glucose logs during their next visit.   - I advised patient to maintain close follow up with Terie PurserJACKSON,SAMANTHA, PA-C for primary care needs.  Follow up plan: - Return in about 4 months (around 09/02/2016) for follow up with pre-visit labs.  Marquis LunchGebre Nida, MD Phone: [phone]  Fax: [phone]   05/05/2016, 3:57 PM

## 2016-05-05 NOTE — Patient Instructions (Signed)

## 2016-07-26 ENCOUNTER — Other Ambulatory Visit: Payer: Self-pay

## 2016-07-26 MED ORDER — FENOFIBRATE 145 MG PO TABS: 145.0000 mg | ORAL_TABLET | Freq: Every day | ORAL | 3 refills | Status: DC

## 2016-07-26 MED ORDER — SIMVASTATIN 40 MG PO TABS: 40.0000 mg | ORAL_TABLET | Freq: Every day | ORAL | 3 refills | Status: DC

## 2016-08-16 ENCOUNTER — Encounter (HOSPITAL_COMMUNITY): Payer: Self-pay | Admitting: *Deleted

## 2016-08-16 ENCOUNTER — Inpatient Hospital Stay (HOSPITAL_COMMUNITY): Payer: BLUE CROSS/BLUE SHIELD

## 2016-08-16 ENCOUNTER — Emergency Department (HOSPITAL_COMMUNITY): Payer: BLUE CROSS/BLUE SHIELD

## 2016-08-16 ENCOUNTER — Inpatient Hospital Stay (HOSPITAL_COMMUNITY)
Admission: EM | Admit: 2016-08-16 | Discharge: 2016-08-19 | DRG: 872 | Disposition: A | Discharge status: Home / Self Care | Payer: BLUE CROSS/BLUE SHIELD | Attending: Internal Medicine | Admitting: Internal Medicine

## 2016-08-16 DIAGNOSIS — Z7982 Long term (current) use of aspirin: Secondary | ICD-10-CM

## 2016-08-16 DIAGNOSIS — E1165 Type 2 diabetes mellitus with hyperglycemia: Secondary | ICD-10-CM | POA: Diagnosis not present

## 2016-08-16 DIAGNOSIS — K269 Duodenal ulcer, unspecified as acute or chronic, without hemorrhage or perforation: Secondary | ICD-10-CM | POA: Diagnosis present

## 2016-08-16 DIAGNOSIS — Z7984 Long term (current) use of oral hypoglycemic drugs: Secondary | ICD-10-CM | POA: Diagnosis not present

## 2016-08-16 DIAGNOSIS — E118 Type 2 diabetes mellitus with unspecified complications: Secondary | ICD-10-CM

## 2016-08-16 DIAGNOSIS — K8309 Other cholangitis: Secondary | ICD-10-CM

## 2016-08-16 DIAGNOSIS — A419 Sepsis, unspecified organism: Secondary | ICD-10-CM | POA: Diagnosis not present

## 2016-08-16 DIAGNOSIS — R0602 Shortness of breath: Secondary | ICD-10-CM

## 2016-08-16 DIAGNOSIS — Z79899 Other long term (current) drug therapy: Secondary | ICD-10-CM

## 2016-08-16 DIAGNOSIS — R1111 Vomiting without nausea: Secondary | ICD-10-CM | POA: Diagnosis not present

## 2016-08-16 DIAGNOSIS — N179 Acute kidney failure, unspecified: Secondary | ICD-10-CM | POA: Diagnosis not present

## 2016-08-16 DIAGNOSIS — E860 Dehydration: Secondary | ICD-10-CM | POA: Diagnosis present

## 2016-08-16 DIAGNOSIS — K7290 Hepatic failure, unspecified without coma: Secondary | ICD-10-CM | POA: Diagnosis not present

## 2016-08-16 DIAGNOSIS — K8050 Calculus of bile duct without cholangitis or cholecystitis without obstruction: Secondary | ICD-10-CM

## 2016-08-16 DIAGNOSIS — K8071 Calculus of gallbladder and bile duct without cholecystitis with obstruction: Secondary | ICD-10-CM | POA: Diagnosis present

## 2016-08-16 DIAGNOSIS — E119 Type 2 diabetes mellitus without complications: Secondary | ICD-10-CM | POA: Diagnosis present

## 2016-08-16 DIAGNOSIS — R935 Abnormal findings on diagnostic imaging of other abdominal regions, including retroperitoneum: Secondary | ICD-10-CM | POA: Diagnosis not present

## 2016-08-16 DIAGNOSIS — R52 Pain, unspecified: Secondary | ICD-10-CM

## 2016-08-16 DIAGNOSIS — Z87891 Personal history of nicotine dependence: Secondary | ICD-10-CM | POA: Diagnosis not present

## 2016-08-16 DIAGNOSIS — E1122 Type 2 diabetes mellitus with diabetic chronic kidney disease: Secondary | ICD-10-CM | POA: Diagnosis present

## 2016-08-16 DIAGNOSIS — R7989 Other specified abnormal findings of blood chemistry: Secondary | ICD-10-CM | POA: Diagnosis present

## 2016-08-16 DIAGNOSIS — R17 Unspecified jaundice: Secondary | ICD-10-CM | POA: Diagnosis not present

## 2016-08-16 DIAGNOSIS — K760 Fatty (change of) liver, not elsewhere classified: Secondary | ICD-10-CM | POA: Diagnosis present

## 2016-08-16 DIAGNOSIS — E7800 Pure hypercholesterolemia, unspecified: Secondary | ICD-10-CM | POA: Diagnosis not present

## 2016-08-16 DIAGNOSIS — Z794 Long term (current) use of insulin: Secondary | ICD-10-CM

## 2016-08-16 DIAGNOSIS — N390 Urinary tract infection, site not specified: Secondary | ICD-10-CM | POA: Diagnosis present

## 2016-08-16 DIAGNOSIS — I10 Essential (primary) hypertension: Secondary | ICD-10-CM | POA: Diagnosis present

## 2016-08-16 DIAGNOSIS — K830 Cholangitis: Secondary | ICD-10-CM | POA: Diagnosis present

## 2016-08-16 DIAGNOSIS — I959 Hypotension, unspecified: Secondary | ICD-10-CM | POA: Diagnosis not present

## 2016-08-16 DIAGNOSIS — K8020 Calculus of gallbladder without cholecystitis without obstruction: Secondary | ICD-10-CM | POA: Diagnosis not present

## 2016-08-16 DIAGNOSIS — K219 Gastro-esophageal reflux disease without esophagitis: Secondary | ICD-10-CM | POA: Diagnosis present

## 2016-08-16 DIAGNOSIS — N183 Chronic kidney disease, stage 3 unspecified: Secondary | ICD-10-CM | POA: Diagnosis present

## 2016-08-16 DIAGNOSIS — K8030 Calculus of bile duct with cholangitis, unspecified, without obstruction: Secondary | ICD-10-CM | POA: Diagnosis not present

## 2016-08-16 DIAGNOSIS — K8031 Calculus of bile duct with cholangitis, unspecified, with obstruction: Secondary | ICD-10-CM | POA: Diagnosis not present

## 2016-08-16 DIAGNOSIS — R1110 Vomiting, unspecified: Secondary | ICD-10-CM | POA: Diagnosis not present

## 2016-08-16 DIAGNOSIS — R945 Abnormal results of liver function studies: Secondary | ICD-10-CM

## 2016-08-16 LAB — CBC WITH DIFFERENTIAL/PLATELET
BASOS PCT: 0 %
Basophils Absolute: 0 10*3/uL (ref 0.0–0.1)
EOS ABS: 0 10*3/uL (ref 0.0–0.7)
Eosinophils Relative: 0 %
HCT: 37.2 % — ABNORMAL LOW (ref 39.0–52.0)
Hemoglobin: 12.4 g/dL — ABNORMAL LOW (ref 13.0–17.0)
Lymphocytes Relative: 7 %
Lymphs Abs: 1.9 10*3/uL (ref 0.7–4.0)
MCH: 29 pg (ref 26.0–34.0)
MCHC: 33.3 g/dL (ref 30.0–36.0)
MCV: 87.1 fL (ref 78.0–100.0)
MONO ABS: 1.1 10*3/uL — AB (ref 0.1–1.0)
MONOS PCT: 4 %
NEUTROS PCT: 89 %
Neutro Abs: 25.7 10*3/uL — ABNORMAL HIGH (ref 1.7–7.7)
Platelets: 267 10*3/uL (ref 150–400)
RBC: 4.27 MIL/uL (ref 4.22–5.81)
RDW: 14.1 % (ref 11.5–15.5)
WBC: 28.8 10*3/uL — ABNORMAL HIGH (ref 4.0–10.5)

## 2016-08-16 LAB — COMPREHENSIVE METABOLIC PANEL
ALK PHOS: 94 U/L (ref 38–126)
ALT: 334 U/L — AB (ref 17–63)
ANION GAP: 10 (ref 5–15)
AST: 465 U/L — ABNORMAL HIGH (ref 15–41)
Albumin: 2.5 g/dL — ABNORMAL LOW (ref 3.5–5.0)
BILIRUBIN TOTAL: 8.6 mg/dL — AB (ref 0.3–1.2)
BUN: 38 mg/dL — ABNORMAL HIGH (ref 6–20)
CALCIUM: 8.5 mg/dL — AB (ref 8.9–10.3)
CO2: 25 mmol/L (ref 22–32)
CREATININE: 3.62 mg/dL — AB (ref 0.61–1.24)
Chloride: 102 mmol/L (ref 101–111)
GFR calc Af Amer: 19 mL/min — ABNORMAL LOW (ref >60)
GFR calc non Af Amer: 16 mL/min — ABNORMAL LOW (ref >60)
GLUCOSE: 173 mg/dL — AB (ref 65–99)
Potassium: 4.2 mmol/L (ref 3.5–5.1)
SODIUM: 137 mmol/L (ref 135–145)
TOTAL PROTEIN: 6.5 g/dL (ref 6.5–8.1)

## 2016-08-16 LAB — URINALYSIS, ROUTINE W REFLEX MICROSCOPIC
Glucose, UA: 500 mg/dL — AB
NITRITE: NEGATIVE
PROTEIN: 100 mg/dL — AB
Specific Gravity, Urine: 1.02 (ref 1.005–1.030)
pH: 5 (ref 5.0–8.0)

## 2016-08-16 LAB — URINALYSIS, MICROSCOPIC (REFLEX)

## 2016-08-16 LAB — PROTIME-INR
INR: 1.18
PROTHROMBIN TIME: 15.1 s (ref 11.4–15.2)

## 2016-08-16 LAB — TROPONIN I

## 2016-08-16 LAB — MRSA PCR SCREENING: MRSA by PCR: NEGATIVE

## 2016-08-16 LAB — GLUCOSE, CAPILLARY
GLUCOSE-CAPILLARY: 94 mg/dL (ref 65–99)
Glucose-Capillary: 93 mg/dL (ref 65–99)

## 2016-08-16 LAB — BILIRUBIN, DIRECT: Bilirubin, Direct: 6.5 mg/dL — ABNORMAL HIGH (ref 0.1–0.5)

## 2016-08-16 LAB — LIPASE, BLOOD: LIPASE: 25 U/L (ref 11–51)

## 2016-08-16 LAB — ACETAMINOPHEN LEVEL: Acetaminophen (Tylenol), Serum: <10 ug/mL — ABNORMAL LOW (ref 10–30)

## 2016-08-16 LAB — PROCALCITONIN: Procalcitonin: 38.93 ng/mL

## 2016-08-16 LAB — LACTIC ACID, PLASMA
Lactic Acid, Venous: 1 mmol/L (ref 0.5–1.9)
Lactic Acid, Venous: 0.8 mmol/L (ref 0.5–1.9)

## 2016-08-16 LAB — APTT: aPTT: 31 seconds (ref 24–36)

## 2016-08-16 MED ORDER — INSULIN ASPART 100 UNIT/ML ~~LOC~~ SOLN: 0.0000 [IU] | Freq: Three times a day (TID) | SUBCUTANEOUS | Status: DC

## 2016-08-16 MED ORDER — ONDANSETRON HCL 4 MG/2ML IJ SOLN: 4.0000 mg | Freq: Four times a day (QID) | INTRAMUSCULAR | Status: DC | PRN

## 2016-08-16 MED ORDER — SODIUM CHLORIDE 0.9 % IV BOLUS (SEPSIS): 1000.0000 mL | Freq: Once | INTRAVENOUS | Status: AC

## 2016-08-16 MED ORDER — HEPARIN SODIUM (PORCINE) 5000 UNIT/ML IJ SOLN
5000.0000 [IU] | Freq: Three times a day (TID) | INTRAMUSCULAR | Status: DC
  Administered 2016-08-16 – 2016-08-17 (×2): 5000 [IU] via SUBCUTANEOUS
  Filled 2016-08-16 (×2): qty 1

## 2016-08-16 MED ORDER — IOPAMIDOL (ISOVUE-300) INJECTION 61%
INTRAVENOUS | Status: AC
  Administered 2016-08-16: 30 mL
  Administered 2016-08-17: 08:00:00
  Filled 2016-08-16: qty 30

## 2016-08-16 MED ORDER — ONDANSETRON HCL 4 MG PO TABS: 4.0000 mg | ORAL_TABLET | Freq: Four times a day (QID) | ORAL | Status: DC | PRN

## 2016-08-16 MED ORDER — SODIUM CHLORIDE 0.9 % IV BOLUS (SEPSIS)
1000.0000 mL | Freq: Once | INTRAVENOUS | Status: AC
  Administered 2016-08-16: 1000 mL via INTRAVENOUS

## 2016-08-16 MED ORDER — ASPIRIN 81 MG PO CHEW
81.0000 mg | CHEWABLE_TABLET | Freq: Every day | ORAL | Status: DC
  Administered 2016-08-16 – 2016-08-17 (×2): 81 mg via ORAL
  Filled 2016-08-16 (×2): qty 1

## 2016-08-16 MED ORDER — PIPERACILLIN-TAZOBACTAM 3.375 G IVPB
3.3750 g | Freq: Three times a day (TID) | INTRAVENOUS | Status: DC
  Administered 2016-08-16 – 2016-08-19 (×9): 3.375 g via INTRAVENOUS
  Filled 2016-08-16 (×9): qty 50

## 2016-08-16 MED ORDER — ACETAMINOPHEN 650 MG RE SUPP: 650.0000 mg | Freq: Four times a day (QID) | RECTAL | Status: DC | PRN

## 2016-08-16 MED ORDER — DEXTROSE 5 % IV SOLN
1.0000 g | Freq: Once | INTRAVENOUS | Status: AC
  Administered 2016-08-16: 1 g via INTRAVENOUS
  Filled 2016-08-16: qty 10

## 2016-08-16 MED ORDER — SODIUM CHLORIDE 0.9 % IV BOLUS (SEPSIS)
2000.0000 mL | Freq: Once | INTRAVENOUS | Status: AC
  Administered 2016-08-16: 2000 mL via INTRAVENOUS

## 2016-08-16 MED ORDER — PIPERACILLIN-TAZOBACTAM 3.375 G IVPB 30 MIN: 3.3750 g | Freq: Once | INTRAVENOUS | Status: DC

## 2016-08-16 MED ORDER — ACETAMINOPHEN 325 MG PO TABS: 650.0000 mg | ORAL_TABLET | Freq: Four times a day (QID) | ORAL | Status: DC | PRN

## 2016-08-16 MED ORDER — SODIUM CHLORIDE 0.9 % IV SOLN
INTRAVENOUS | Status: DC
  Administered 2016-08-16 – 2016-08-18 (×3): via INTRAVENOUS
  Administered 2016-08-18: 1000 mL via INTRAVENOUS
  Administered 2016-08-19: 01:00:00 via INTRAVENOUS

## 2016-08-16 MED ORDER — INSULIN ASPART 100 UNIT/ML ~~LOC~~ SOLN: 0.0000 [IU] | Freq: Every day | SUBCUTANEOUS | Status: DC

## 2016-08-16 NOTE — ED Provider Notes (Signed)
[facility]-EMERGENCY DEPT Provider Note   CSN: [id number] Arrival date & time: 08/16/16  [id number]  By signing my name below, I, [name], attest that this documentation has been prepared under the direction and in the presence of Doug SouJacubowitz, Elizah Lydon, MD. Electronically Signed: [name], ED Scribe. 08/16/16. 8:17 AM.     History   Chief Complaint Chief Complaint  Patient presents with  . Dizziness    HPI Javier SchatzWilliam Walker is a 66 y.o. male who presents to the Emergency Department complaining of intermittent episodes of periumbilical abdominal pain, lasting 30 minutes at a time, for 1.5w. Pt states he is not currently in pain, but was intermittently in pain throughout the day yesterday. Pt states his pain is worsened riding in the car and alleviated while being still. He additionally reports not urinating since last night, and 2 episodes of emesis in the last 5 days. Per pt, he currently does not have  the urge to urinate. Pt denies taking OTC medications at home to improve symptoms. Last normal BM was this morning. Pt additionally complains of lightheadedness/dizziness with his symptoms, which has currently subsided. Pt does reports falling d/t the lightheadedness last night at ~8:30PM. He denies any injury, head trauma or LOC. Wife reports the pt was recently hospitalized and states pt has not been eating well for 5 days, approximately since discharge. 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 Pt is diabetic and states his blood sugar is well managed, with last CBG at home 140. He is a non-smoker and occasional drinker. He denies illicit drug use. NKDA. He denies dysuria, additional complaints. No other associated symptoms  The history is provided by the patient. No language interpreter was used.    Past Medical History:  Diagnosis Date  . Diabetes mellitus without complication (HCC)   . GERD (gastroesophageal reflux disease)    occ-no meds  . Hyperlipemia   .  Hypertension   . Shortness of breath     Patient Active Problem List   Diagnosis Date Noted  . Uncontrolled type 2 diabetes mellitus with complication, without long-term current use of insulin (HCC) 03/01/2016  . Essential hypertension, benign 03/01/2016  . Hypercholesteremia 03/01/2016  . Morbid obesity (HCC) 03/01/2016    Past Surgical History:  Procedure Laterality Date  . COLON SURGERY  2011   gangrene lower colon with colostomy  . COLOSTOMY REVERSAL  2011  . OPEN REDUCTION INTERNAL FIXATION (ORIF) TIBIA/FIBULA FRACTURE Right 08/30/2012   Procedure: OPEN REDUCTION INTERNAL FIXATION (ORIF) TIBIA/FIBULA FRACTURE;  Surgeon: Toni ArthursJohn Hewitt, MD;  Location: [facility];  Service: Orthopedics;  Laterality: Right;  . WOUND DEBRIDEMENT  2011   abd-       Home Medications    Prior to Admission medications   Medication Sig Start Date End Date Taking? Authorizing Provider  aspirin 81 MG chewable tablet Chew by mouth daily.    [provider]  Canagliflozin-Metformin HCl ER (INVOKAMET XR) [phone] MG TB24 Take 1 tablet by mouth daily with breakfast. 05/05/16   Nida, Denman GeorgeGebreselassie W, MD  fenofibrate (TRICOR) 145 MG tablet Take 1 tablet (145 mg total) by mouth daily. 07/26/16   Roma KayserNida, Gebreselassie W, MD  lisinopril (PRINIVIL,ZESTRIL) 10 MG tablet Take 1 tablet (10 mg total) by mouth daily. 03/16/16   Roma KayserNida, Gebreselassie W, MD  Omega-3 Fatty Acids (FISH OIL) 1000 MG CAPS Take 1 capsule (1,000 mg total) by mouth 2 (two) times daily. 03/16/16   Roma KayserNida, Gebreselassie W, MD  simvastatin (ZOCOR)  40 MG tablet Take 1 tablet (40 mg total) by mouth daily. 07/26/16   Roma Kayser, MD    Family History Family History  Problem Relation Age of Onset  . Heart attack Mother   . Heart attack Father     Social History Social History  Substance Use Topics  . Smoking status: Former Smoker    Quit date: 08/27/1988  . Smokeless tobacco: Never Used  . Alcohol use Yes      Allergies   Patient has no known allergies.   Review of Systems Review of Systems  Constitutional: Positive for appetite change.  HENT: Negative.   Respiratory: Negative.   Cardiovascular: Negative.   Gastrointestinal: Positive for abdominal pain and vomiting.  Genitourinary: Negative for dysuria.  Musculoskeletal: Negative.   Skin: Negative.   Allergic/Immunologic: Positive for immunocompromised state.       Diabetic  Neurological: Positive for light-headedness. Negative for syncope.  Psychiatric/Behavioral: Negative.   All other systems reviewed and are negative.    Physical Exam Updated Vital Signs BP (!) 94/52   Pulse 76   Temp 97.7 F (36.5 C) (Oral)   Resp (!) 23   Ht 5\' 11"  (1.803 m)   Wt 215 lb (97.5 kg)   SpO2 96%   BMI 29.99 kg/m   Physical Exam  Constitutional: No distress.  HENT:  Head: Normocephalic and atraumatic.  Sclera icteric, poor dentition, mucous membranes dry  Eyes: Conjunctivae are normal. Pupils are equal, round, and reactive to light.  Neck: Neck supple. No tracheal deviation present. No thyromegaly present.  Cardiovascular: Normal rate and regular rhythm.   No murmur heard. Pulmonary/Chest: Effort normal and breath sounds normal.  Abdominal: Soft. Bowel sounds are normal. He exhibits no distension and no mass. There is tenderness. There is no rebound and no guarding.   Surgical scar tender at right lower quadrant  Genitourinary: Penis normal.  Genitourinary Comments: Normal male genitalia  Musculoskeletal: Normal range of motion. He exhibits no edema or tenderness.  Neurological: He is alert. Coordination normal.  Skin: Skin is warm and dry. No rash noted.  Psychiatric: He has a normal mood and affect.  Nursing note and vitals reviewed.    ED Treatments / Results   DIAGNOSTIC STUDIES: Oxygen Saturation is 96% on RA, adequate by my interpretation.    COORDINATION OF CARE: 8:04 AM Discussed treatment plan with pt at  bedside and pt agreed to plan.    Labs (all labs ordered are listed, but only abnormal results are displayed) Labs Reviewed - No data to display  EKG  EKG Interpretation None       Radiology No results found.  Procedures Procedures (including critical care time)  Medications Ordered in ED Medications - No data to display  Results for orders placed or performed during the hospital encounter of 08/16/16  Comprehensive metabolic panel  Result Value Ref Range   Sodium 137 135 - 145 mmol/L   Potassium 4.2 3.5 - 5.1 mmol/L   Chloride 102 101 - 111 mmol/L   CO2 25 22 - 32 mmol/L   Glucose, Bld 173 (H) 65 - 99 mg/dL   BUN 38 (H) 6 - 20 mg/dL   Creatinine, Ser 1.61 (H) 0.61 - 1.24 mg/dL   Calcium 8.5 (L) 8.9 - 10.3 mg/dL   Total Protein 6.5 6.5 - 8.1 g/dL   Albumin 2.5 (L) 3.5 - 5.0 g/dL   AST 096 (H) 15 - 41 U/L   ALT 334 (H) 17 - 63  U/L   Alkaline Phosphatase 94 38 - 126 U/L   Total Bilirubin 8.6 (H) 0.3 - 1.2 mg/dL   GFR calc non Af Amer 16 (L) >60 mL/min   GFR calc Af Amer 19 (L) >60 mL/min   Anion gap 10 5 - 15  CBC with Differential/Platelet  Result Value Ref Range   WBC 28.8 (H) 4.0 - 10.5 K/uL   RBC 4.27 4.22 - 5.81 MIL/uL   Hemoglobin 12.4 (L) 13.0 - 17.0 g/dL   HCT 78.2 (L) 95.6 - 21.3 %   MCV 87.1 78.0 - 100.0 fL   MCH 29.0 26.0 - 34.0 pg   MCHC 33.3 30.0 - 36.0 g/dL   RDW 08.6 57.8 - 46.9 %   Platelets 267 150 - 400 K/uL   Neutrophils Relative % 89 %   Neutro Abs 25.7 (H) 1.7 - 7.7 K/uL   Lymphocytes Relative 7 %   Lymphs Abs 1.9 0.7 - 4.0 K/uL   Monocytes Relative 4 %   Monocytes Absolute 1.1 (H) 0.1 - 1.0 K/uL   Eosinophils Relative 0 %   Eosinophils Absolute 0.0 0.0 - 0.7 K/uL   Basophils Relative 0 %   Basophils Absolute 0.0 0.0 - 0.1 K/uL   WBC Morphology ATYPICAL LYMPHOCYTES   Lipase, blood  Result Value Ref Range   Lipase 25 11 - 51 U/L  Troponin I  Result Value Ref Range   Troponin I <0.03 <0.03 ng/mL  Urinalysis, Routine w reflex  microscopic  Result Value Ref Range   Color, Urine YELLOW YELLOW   APPearance CLOUDY (A) CLEAR   Specific Gravity, Urine 1.020 1.005 - 1.030   pH 5.0 5.0 - 8.0   Glucose, UA 500 (A) NEGATIVE mg/dL   Hgb urine dipstick SMALL (A) NEGATIVE   Bilirubin Urine MODERATE (A) NEGATIVE   Ketones, ur TRACE (A) NEGATIVE mg/dL   Protein, ur 629 (A) NEGATIVE mg/dL   Nitrite NEGATIVE NEGATIVE   Leukocytes, UA MODERATE (A) NEGATIVE  Urinalysis, Microscopic (reflex)  Result Value Ref Range   RBC / HPF 6-30 0 - 5 RBC/hpf   WBC, UA TOO NUMEROUS TO COUNT 0 - 5 WBC/hpf   Bacteria, UA MANY (A) NONE SEEN   Squamous Epithelial / LPF TOO NUMEROUS TO COUNT (A) NONE SEEN   Ct Abdomen Pelvis Wo Contrast  Result Date: 08/16/2016 CLINICAL DATA:  Abdominal pain, nausea and vomiting intermittently for weeks. Reported history of cholelithiasis. EXAM: CT ABDOMEN AND PELVIS WITHOUT CONTRAST TECHNIQUE: Multidetector CT imaging of the abdomen and pelvis was performed following the standard protocol without IV contrast. COMPARISON:  None. FINDINGS: Lower chest: Nonspecific patchy subpleural reticulation at both lung bases with mild traction bronchiolectasis. Left lower lobe 2 mm solid pulmonary nodule (series 4/image 16). Coronary atherosclerosis. Hepatobiliary: Mild diffuse hepatic steatosis. No liver mass. Questionable fine liver surface irregularity. Cholelithiasis. No gallbladder wall thickening, gallbladder distention or pericholecystic fluid. No biliary ductal dilatation. Pancreas: Heterogeneous fatty infiltration of the pancreas, with no pancreatic mass or duct dilation. Spleen: Normal size. No mass. Adrenals/Urinary Tract: Normal adrenals. No hydronephrosis. No renal stones. Exophytic 0.7 cm renal cortical lesion in the lateral upper left kidney, too small to characterize, for which no further follow-up is required. No additional contour deforming renal mass. Normal caliber ureters, with no ureteral stones. Normal bladder.  Stomach/Bowel: Grossly normal stomach. There is a small periumbilical hernia containing a tiny portion of a mid small bowel loop (series 2/ image 64). Normal caliber small bowel. No small bowel wall  thickening or pneumatosis. Normal appendix. Oral contrast reaches the distal rectum. Normal large bowel with no diverticulosis, large bowel wall thickening or pericolonic fat stranding. Vascular/Lymphatic: Atherosclerotic nonaneurysmal abdominal aorta. No pathologically enlarged lymph nodes in the abdomen or pelvis. Reproductive: Top-normal size prostate with nonspecific internal prostatic calcifications. Other: No pneumoperitoneum, ascites or focal fluid collection. Musculoskeletal: No aggressive appearing focal osseous lesions. Mild thoracolumbar spondylosis. IMPRESSION: 1. Small periumbilical hernia containing a tiny portion of a mid small bowel loop, with no evidence of bowel obstruction or ischemia. 2. Diffuse hepatic steatosis. Question fine liver surface irregularity, cannot exclude early cirrhosis. Consider hepatic elastography for further liver fibrosis risk stratification, as clinically warranted. 3. Nonspecific findings at the lung bases that may indicate interstitial lung disease. Outpatient high-resolution chest CT may be obtained as clinically warranted. 4. Additional findings include cholelithiasis, aortic atherosclerosis and coronary atherosclerosis. Electronically Signed   By: Delbert Phenix M.D.   On: 08/16/2016 12:30   Initial Impression / Assessment and Plan / ED Course  I have reviewed the triage vital signs and the nursing notes.  Pertinent labs & imaging results that were available during my care of the patient were reviewed by me and considered in my medical decision making (see chart for details).     1:50 PM patient remains comfortable after treatment with intravenous hydration. He is alert awake and appears in no distress, however he does remain hypotensive I've consulted hospitalist  who will arrange for admission to stepdown unit IV Rocephin ordered by me in addition. I suspect the patient will need gastroenterology consult as well as intravenous hydration   Final Clinical Impressions(s) / ED Diagnoses  Diagnosis #1 acute renal failure #2 hepatic failure #3 for glycemia #4 urinary tract infection #5 hypotension Final diagnoses:  None  CRITICAL CARE Performed by: Doug Sou Total critical care time: 35 minutes Critical care time was exclusive of separately billable procedures and treating other patients. Critical care was necessary to treat or prevent imminent or life-threatening deterioration. Critical care was time spent personally by me on the following activities: development of treatment plan with patient and/or surrogate as well as nursing, discussions with consultants, evaluation of patient's response to treatment, examination of patient, obtaining history from patient or surrogate, ordering and performing treatments and interventions, ordering and review of laboratory studies, ordering and review of radiographic studies, pulse oximetry and re-evaluation of patient's condition.  New Prescriptions New Prescriptions   No medications on file     I personally performed the services described in this documentation, which was scribed in my presence. The recorded information has been reviewed and considered.    Doug Sou, MD 08/16/16 [phone]

## 2016-08-16 NOTE — H&P (Addendum)
History and Physical    Javier SchatzWilliam Ocallaghan ZOX:[id number] DOB: 02-Jan-1951 DOA: 08/16/2016  PCP: [facility], [facility]  Patient coming from: home  I have personally briefly reviewed patient's old medical [facility]  Chief Complaint: Abdominal pain  HPI: Javier Walker is a 66 y.o. male with medical history significant of hypertension, diabetes, hyperlipidemia, presents to the ER with complaints of abdominal pain for the past 10 days. Pain has been intermittent, located throughout the abdomen. He has had associated nausea and vomiting. By mouth intake has been poor the past week. He has not had any fevers. He began having diarrhea this morning. He's noticed decreased urine output since this morning which led him to come in the emergency room. He is feeling dizzy on standing and has fallen multiple times. He is generally weak. He went to [facility] yesterday and underwent abdominal ultrasound. Findings indicated cholelithiasis with gallbladder wall thickening. He was discharged home with tramadol. He says he does occasionally take Tylenol and ibuprofen. Denies any heavy alcohol consumption.  ED Course: In the emergency room, he was noted to be hypotensive with a systolic blood pressure in the 80s. He was afebrile. Lab work indicated elevated creatinine at 3.6. Bilirubin noted to be elevated at 8.2. Transaminases were AST 465, ALT 334. WBC count noted to be markedly elevated at 28.8. CT scan of the abdomen and pelvis indicated evidence of cholelithiasis. Common bile duct was noted to be normal diameter. Urinalysis indicated a possible infection. Chest x-ray did not show any significant findings.  Review of Systems: As per HPI otherwise 10 point review of systems negative.    Past Medical History:  Diagnosis Date  . Diabetes mellitus without complication (HCC)   . GERD (gastroesophageal reflux disease)    occ-no meds  . Hyperlipemia   . Hypertension   .  Shortness of breath     Past Surgical History:  Procedure Laterality Date  . COLON SURGERY  2011   gangrene lower colon with colostomy  . COLOSTOMY REVERSAL  2011  . OPEN REDUCTION INTERNAL FIXATION (ORIF) TIBIA/FIBULA FRACTURE Right 08/30/2012   Procedure: OPEN REDUCTION INTERNAL FIXATION (ORIF) TIBIA/FIBULA FRACTURE;  Surgeon: Toni ArthursJohn Hewitt, MD;  Location: [facility];  Service: Orthopedics;  Laterality: Right;  . WOUND DEBRIDEMENT  2011   abd-     reports that he quit smoking about 27 years ago. He has never used smokeless tobacco. He reports that he drinks alcohol. He reports that he does not use drugs.  No Known Allergies  Family History  Problem Relation Age of Onset  . Heart attack Mother   . Heart attack Father      Prior to Admission medications   Medication Sig Start Date End Date Taking? Authorizing Provider  aspirin 81 MG chewable tablet Chew by mouth daily.   Yes [provider]  Canagliflozin-Metformin HCl ER (INVOKAMET XR) [phone] MG TB24 Take 1 tablet by mouth daily with breakfast. 05/05/16  Yes Nida, Denman GeorgeGebreselassie W, MD  fenofibrate (TRICOR) 145 MG tablet Take 1 tablet (145 mg total) by mouth daily. 07/26/16  Yes Nida, Denman GeorgeGebreselassie W, MD  lisinopril (PRINIVIL,ZESTRIL) 10 MG tablet Take 1 tablet (10 mg total) by mouth daily. 03/16/16  Yes Nida, Denman GeorgeGebreselassie W, MD  Omega-3 Fatty Acids (FISH OIL) 1000 MG CAPS Take 1 capsule (1,000 mg total) by mouth 2 (two) times daily. 03/16/16  Yes Roma KayserNida, Gebreselassie W, MD  simvastatin (ZOCOR) 40 MG tablet Take 1 tablet (40 mg total)  by mouth daily. 07/26/16  Yes Nida, Denman George, MD  traMADol (ULTRAM) 50 MG tablet Take 50 mg by mouth daily as needed for moderate pain.  08/15/16  Yes [provider]    Physical Exam: Vitals:   08/16/16 1530 08/16/16 1553 08/16/16 1600 08/16/16 1630  BP: (!) 101/53  110/63 (!) 109/57  Pulse: 79  80 81  Resp: (!) 25  19 19   Temp: 97.8 F (36.6 C) 98.4 F (36.9  C)    TempSrc: Oral Oral    SpO2: 98%  99% 96%  Weight:  95.7 kg (210 lb 15.7 oz)    Height:  5\' 11"  (1.803 m)      Constitutional: NAD, calm, comfortable Vitals:   08/16/16 1530 08/16/16 1553 08/16/16 1600 08/16/16 1630  BP: (!) 101/53  110/63 (!) 109/57  Pulse: 79  80 81  Resp: (!) 25  19 19   Temp: 97.8 F (36.6 C) 98.4 F (36.9 C)    TempSrc: Oral Oral    SpO2: 98%  99% 96%  Weight:  95.7 kg (210 lb 15.7 oz)    Height:  5\' 11"  (1.803 m)     Eyes: PERRL, lids and conjunctivae normal ENMT: Mucous membranes are moist. Posterior pharynx clear of any exudate or lesions.Normal dentition.  Neck: normal, supple, no masses, no thyromegaly Respiratory: clear to auscultation bilaterally, no wheezing, no crackles. Normal respiratory effort. No accessory muscle use.  Cardiovascular: Regular rate and rhythm, no murmurs / rubs / gallops. No extremity edema. 2+ pedal pulses. No carotid bruits.  Abdomen: tenderness in the RUQ, no masses palpated. No hepatosplenomegaly. Bowel sounds positive.  Musculoskeletal: no clubbing / cyanosis. No joint deformity upper and lower extremities. Good ROM, no contractures. Normal muscle tone.  Skin: no rashes, lesions, ulcers. No induration Neurologic: CN 2-12 grossly intact. Sensation intact, DTR normal. Strength 5/5 in all 4.  Psychiatric: Normal judgment and insight. Alert and oriented x 3. Normal mood.   Labs on Admission: I have personally reviewed following labs and imaging studies  CBC:  Recent Labs Lab 08/16/16 0857  WBC 28.8*  NEUTROABS 25.7*  HGB 12.4*  HCT 37.2*  MCV 87.1  PLT 267   Basic Metabolic Panel:  Recent Labs Lab 08/16/16 0857  NA 137  K 4.2  CL 102  CO2 25  GLUCOSE 173*  BUN 38*  CREATININE 3.62*  CALCIUM 8.5*   GFR: Estimated Creatinine Clearance: 24 mL/min (A) (by C-G formula based on SCr of 3.62 mg/dL (H)). Liver Function Tests:  Recent Labs Lab 08/16/16 0857  AST 465*  ALT 334*  ALKPHOS 94  BILITOT  8.6*  PROT 6.5  ALBUMIN 2.5*    Recent Labs Lab 08/16/16 0857  LIPASE 25   No results for input(s): AMMONIA in the last 168 hours. Coagulation Profile: No results for input(s): INR, PROTIME in the last 168 hours. Cardiac Enzymes:  Recent Labs Lab 08/16/16 0857  TROPONINI <0.03   BNP (last 3 results) No results for input(s): PROBNP in the last 8760 hours. HbA1C: No results for input(s): HGBA1C in the last 72 hours. CBG: No results for input(s): GLUCAP in the last 168 hours. Lipid Profile: No results for input(s): CHOL, HDL, LDLCALC, TRIG, CHOLHDL, LDLDIRECT in the last 72 hours. Thyroid Function Tests: No results for input(s): TSH, T4TOTAL, FREET4, T3FREE, THYROIDAB in the last 72 hours. Anemia Panel: No results for input(s): VITAMINB12, FOLATE, FERRITIN, TIBC, IRON, RETICCTPCT in the last 72 hours. Urine analysis:    Component Value  Date/Time   COLORURINE YELLOW 08/16/2016 0813   APPEARANCEUR CLOUDY (A) 08/16/2016 0813   LABSPEC 1.020 08/16/2016 0813   PHURINE 5.0 08/16/2016 0813   GLUCOSEU 500 (A) 08/16/2016 0813   HGBUR SMALL (A) 08/16/2016 0813   BILIRUBINUR MODERATE (A) 08/16/2016 0813   KETONESUR TRACE (A) 08/16/2016 0813   PROTEINUR 100 (A) 08/16/2016 0813   UROBILINOGEN 0.2 05/19/2009 1333   NITRITE NEGATIVE 08/16/2016 0813   LEUKOCYTESUR MODERATE (A) 08/16/2016 0813    Radiological Exams on Admission: Ct Abdomen Pelvis Wo Contrast  Result Date: 08/16/2016 CLINICAL DATA:  Abdominal pain, nausea and vomiting intermittently for weeks. Reported history of cholelithiasis. EXAM: CT ABDOMEN AND PELVIS WITHOUT CONTRAST TECHNIQUE: Multidetector CT imaging of the abdomen and pelvis was performed following the standard protocol without IV contrast. COMPARISON:  None. FINDINGS: Lower chest: Nonspecific patchy subpleural reticulation at both lung bases with mild traction bronchiolectasis. Left lower lobe 2 mm solid pulmonary nodule (series 4/image 16). Coronary  atherosclerosis. Hepatobiliary: Mild diffuse hepatic steatosis. No liver mass. Questionable fine liver surface irregularity. Cholelithiasis. No gallbladder wall thickening, gallbladder distention or pericholecystic fluid. No biliary ductal dilatation. Pancreas: Heterogeneous fatty infiltration of the pancreas, with no pancreatic mass or duct dilation. Spleen: Normal size. No mass. Adrenals/Urinary Tract: Normal adrenals. No hydronephrosis. No renal stones. Exophytic 0.7 cm renal cortical lesion in the lateral upper left kidney, too small to characterize, for which no further follow-up is required. No additional contour deforming renal mass. Normal caliber ureters, with no ureteral stones. Normal bladder. Stomach/Bowel: Grossly normal stomach. There is a small periumbilical hernia containing a tiny portion of a mid small bowel loop (series 2/ image 64). Normal caliber small bowel. No small bowel wall thickening or pneumatosis. Normal appendix. Oral contrast reaches the distal rectum. Normal large bowel with no diverticulosis, large bowel wall thickening or pericolonic fat stranding. Vascular/Lymphatic: Atherosclerotic nonaneurysmal abdominal aorta. No pathologically enlarged lymph nodes in the abdomen or pelvis. Reproductive: Top-normal size prostate with nonspecific internal prostatic calcifications. Other: No pneumoperitoneum, ascites or focal fluid collection. Musculoskeletal: No aggressive appearing focal osseous lesions. Mild thoracolumbar spondylosis. IMPRESSION: 1. Small periumbilical hernia containing a tiny portion of a mid small bowel loop, with no evidence of bowel obstruction or ischemia. 2. Diffuse hepatic steatosis. Question fine liver surface irregularity, cannot exclude early cirrhosis. Consider hepatic elastography for further liver fibrosis risk stratification, as clinically warranted. 3. Nonspecific findings at the lung bases that may indicate interstitial lung disease. Outpatient high-resolution  chest CT may be obtained as clinically warranted. 4. Additional findings include cholelithiasis, aortic atherosclerosis and coronary atherosclerosis. Electronically Signed   By: Delbert Phenix M.D.   On: 08/16/2016 12:30   Dg Chest Port 1 View  Result Date: 08/16/2016 CLINICAL DATA:  Shortness of breath and abdominal pain EXAM: PORTABLE CHEST 1 VIEW COMPARISON:  05/19/2009 FINDINGS: Cardiac shadow is at the upper limits of normal in size accentuated by the portable technique. Lungs are clear bilaterally. No acute bony abnormalities is noted. IMPRESSION: No active disease. Electronically Signed   By: Alcide Clever M.D.   On: 08/16/2016 14:14    EKG: Independently reviewed. Sinus rhythm without acute changes  Assessment/Plan Active Problems:   Uncontrolled type 2 diabetes mellitus with complication, without long-term current use of insulin (HCC)   Essential hypertension, benign   Hypercholesteremia   Sepsis (HCC)   AKI (acute kidney injury) (HCC)   Dehydration   Elevated LFTs   Jaundice   UTI (urinary tract infection)    1. Sepsis  possibly from biliary source. Patient has been aggressively hydrated with IV fluids. Will check lactic acid. Check blood cultures. Start the patient on intravenous antibiotics  2. Elevated liver enzymes. Possibly related to gallstones. Concern for underlying cholangitis. Continue on antibiotics. GI has been consulted. I've also discussed the case with general surgery. Will order MRCP for further evaluation of biliary tree. Check acute hepatitis panel, Tylenol level. Repeat labs in a.m .  3. Acute kidney injury. Likely related to dehydration and volume depletion. Patient was also taking ACE inhibitor as prior to admission. Hold ACE inhibitor should now. Continue on IV fluids.  4. Possible UTI. Patient has TNTC WBC on urinalysis. Possibly related to infection, although more likely related to dehydration. Follow-up urine culture.  5. Hypertension. Blood pressure  currently running low. Hold lisinopril for now.  6. Hyperlipidemia. Hold statins in light of elevated liver enzymes.  7. Diabetes. Hold oral medications. Start on sliding scale insulin.  DVT prophylaxis: heparin infusion Code Status: full code Family Communication: discussed with daughter at the bedside Disposition Plan: discharge home once improved Consults called: gastroenterology Admission status: inpatient, stepdown   [facility] MD [facility] Pager [phone]  If 7PM-7AM, please contact night-coverage [email or web address] Password [facility]  08/16/2016, 5:16 PM

## 2016-08-16 NOTE — Progress Notes (Signed)
Pharmacy Antibiotic Note  Candis SchatzWilliam Loeber is a [age] y.o. male admitted on 08/16/2016 with sepsis and IAI.  Pharmacy has been consulted for ZOSYN dosing.  Plan: Zosyn 3.375gm IV q8h, EID Monitor labs, progress, c/s  Height: 5\' 11"  (180.3 cm) Weight: 210 lb 15.7 oz (95.7 kg) IBW/kg (Calculated) : 75.3  Temp (24hrs), Avg:98 F (36.7 C), Min:97.7 F (36.5 C), Max:98.4 F (36.9 C)   Recent Labs Lab 08/16/16 0857  WBC 28.8*  CREATININE 3.62*    Estimated Creatinine Clearance: 24 mL/min (A) (by C-G formula based on SCr of 3.62 mg/dL (H)).    No Known Allergies  Antimicrobials this admission: Zosyn 5/8 >>   Dose adjustments this admission:  Microbiology results:  BCx: pending  UCx: pending   Sputum:    MRSA PCR:   Thank you for allowing pharmacy to be a part of this patient's care.  Valrie HartHall, Trindon Dorton A 08/16/2016 5:20 PM

## 2016-08-16 NOTE — ED Triage Notes (Addendum)
Pt c/o dizziness that started a few days ago. Pt reports he fell yesterday due to the dizziness, no reported injuries. Pt also c/o not being able to urinate since last night around 0600. Denies suprapubic pain, flank pain, dysuria. Pt c/o "not feeling good" x couple of weeks. Pt was seen at Urgent Care recently with dx of gallstones. Nausea and vomiting intermittently x couple weeks. Pt also c/o mid abdominal pain.

## 2016-08-16 NOTE — ED Notes (Signed)
ED Provider at bedside. 

## 2016-08-16 NOTE — ED Notes (Signed)
Report given to Sheria Langameron, RN for room ICU-07.

## 2016-08-17 ENCOUNTER — Inpatient Hospital Stay (HOSPITAL_COMMUNITY): Payer: BLUE CROSS/BLUE SHIELD | Admitting: Anesthesiology

## 2016-08-17 ENCOUNTER — Encounter (HOSPITAL_COMMUNITY)
Admission: EM | Disposition: A | Discharge status: Home / Self Care | Payer: Self-pay | Source: Home / Self Care | Attending: Internal Medicine

## 2016-08-17 ENCOUNTER — Inpatient Hospital Stay (HOSPITAL_COMMUNITY): Payer: BLUE CROSS/BLUE SHIELD

## 2016-08-17 ENCOUNTER — Encounter (HOSPITAL_COMMUNITY): Payer: Self-pay | Admitting: Anesthesiology

## 2016-08-17 DIAGNOSIS — R17 Unspecified jaundice: Secondary | ICD-10-CM

## 2016-08-17 DIAGNOSIS — K8030 Calculus of bile duct with cholangitis, unspecified, without obstruction: Secondary | ICD-10-CM

## 2016-08-17 DIAGNOSIS — K830 Cholangitis: Secondary | ICD-10-CM

## 2016-08-17 DIAGNOSIS — I10 Essential (primary) hypertension: Secondary | ICD-10-CM

## 2016-08-17 DIAGNOSIS — N179 Acute kidney failure, unspecified: Secondary | ICD-10-CM

## 2016-08-17 DIAGNOSIS — R7989 Other specified abnormal findings of blood chemistry: Secondary | ICD-10-CM

## 2016-08-17 HISTORY — PX: ERCP: SHX5425

## 2016-08-17 LAB — COMPREHENSIVE METABOLIC PANEL
ALT: 328 U/L — ABNORMAL HIGH (ref 17–63)
AST: 303 U/L — AB (ref 15–41)
Albumin: 2.4 g/dL — ABNORMAL LOW (ref 3.5–5.0)
Alkaline Phosphatase: 90 U/L (ref 38–126)
Anion gap: 10 (ref 5–15)
BUN: 34 mg/dL — ABNORMAL HIGH (ref 6–20)
CHLORIDE: 107 mmol/L (ref 101–111)
CO2: 22 mmol/L (ref 22–32)
Calcium: 8.5 mg/dL — ABNORMAL LOW (ref 8.9–10.3)
Creatinine, Ser: 2.23 mg/dL — ABNORMAL HIGH (ref 0.61–1.24)
GFR, EST AFRICAN AMERICAN: 34 mL/min — AB (ref >60)
GFR, EST NON AFRICAN AMERICAN: 29 mL/min — AB (ref >60)
Glucose, Bld: 83 mg/dL (ref 65–99)
POTASSIUM: 4 mmol/L (ref 3.5–5.1)
SODIUM: 139 mmol/L (ref 135–145)
Total Bilirubin: 7.1 mg/dL — ABNORMAL HIGH (ref 0.3–1.2)
Total Protein: 6.4 g/dL — ABNORMAL LOW (ref 6.5–8.1)

## 2016-08-17 LAB — HEPATITIS PANEL, ACUTE
HCV Ab: <0.1 s/co ratio (ref 0.0–0.9)
HEP A IGM: NEGATIVE
HEP B C IGM: NEGATIVE
Hepatitis B Surface Ag: NEGATIVE

## 2016-08-17 LAB — GLUCOSE, CAPILLARY
GLUCOSE-CAPILLARY: 78 mg/dL (ref 65–99)
GLUCOSE-CAPILLARY: 70 mg/dL (ref 65–99)
GLUCOSE-CAPILLARY: 95 mg/dL (ref 65–99)
Glucose-Capillary: 60 mg/dL — ABNORMAL LOW (ref 65–99)
Glucose-Capillary: 122 mg/dL — ABNORMAL HIGH (ref 65–99)
Glucose-Capillary: 70 mg/dL (ref 65–99)
Glucose-Capillary: 76 mg/dL (ref 65–99)

## 2016-08-17 LAB — CBC
HEMATOCRIT: 35.8 % — AB (ref 39.0–52.0)
Hemoglobin: 12 g/dL — ABNORMAL LOW (ref 13.0–17.0)
MCH: 29.2 pg (ref 26.0–34.0)
MCHC: 33.5 g/dL (ref 30.0–36.0)
MCV: 87.1 fL (ref 78.0–100.0)
Platelets: 262 10*3/uL (ref 150–400)
RBC: 4.11 MIL/uL — AB (ref 4.22–5.81)
RDW: 14.6 % (ref 11.5–15.5)
WBC: 18.7 10*3/uL — AB (ref 4.0–10.5)

## 2016-08-17 LAB — C DIFFICILE QUICK SCREEN W PCR REFLEX
C DIFFICILE (CDIFF) INTERP: NOT DETECTED
C DIFFICILE (CDIFF) TOXIN: NEGATIVE
C DIFFICLE (CDIFF) ANTIGEN: NEGATIVE

## 2016-08-17 SURGERY — ERCP, WITH INTERVENTION IF INDICATED
Anesthesia: General | Wound class: Clean Contaminated

## 2016-08-17 MED ORDER — PROPOFOL 10 MG/ML IV BOLUS
INTRAVENOUS | Status: DC | PRN
  Administered 2016-08-17: 150 mg via INTRAVENOUS

## 2016-08-17 MED ORDER — GLYCOPYRROLATE 0.2 MG/ML IJ SOLN
0.2000 mg | Freq: Once | INTRAMUSCULAR | Status: AC
  Administered 2016-08-17: 0.2 mg via INTRAVENOUS

## 2016-08-17 MED ORDER — PHENYLEPHRINE HCL 10 MG/ML IJ SOLN
INTRAMUSCULAR | Status: DC | PRN
  Administered 2016-08-17 (×2): 80 ug via INTRAVENOUS

## 2016-08-17 MED ORDER — GLYCOPYRROLATE 0.2 MG/ML IJ SOLN
INTRAMUSCULAR | Status: AC
  Filled 2016-08-17: qty 1

## 2016-08-17 MED ORDER — GLUCAGON HCL RDNA (DIAGNOSTIC) 1 MG IJ SOLR
INTRAMUSCULAR | Status: AC
  Filled 2016-08-17: qty 2

## 2016-08-17 MED ORDER — FENTANYL CITRATE (PF) 100 MCG/2ML IJ SOLN: 25.0000 ug | INTRAMUSCULAR | Status: DC | PRN

## 2016-08-17 MED ORDER — ROCURONIUM 10MG/ML (10ML) SYRINGE FOR MEDFUSION PUMP - OPTIME
INTRAVENOUS | Status: DC | PRN
  Administered 2016-08-17: 5 mg via INTRAVENOUS

## 2016-08-17 MED ORDER — LACTATED RINGERS IV SOLN
INTRAVENOUS | Status: DC
  Administered 2016-08-17: 15:00:00 via INTRAVENOUS

## 2016-08-17 MED ORDER — MIDAZOLAM HCL 2 MG/2ML IJ SOLN
INTRAMUSCULAR | Status: AC
  Filled 2016-08-17: qty 2

## 2016-08-17 MED ORDER — DEXTROSE 50 % IV SOLN
25.0000 mL | Freq: Once | INTRAVENOUS | Status: AC
  Administered 2016-08-17: 25 mL via INTRAVENOUS

## 2016-08-17 MED ORDER — SUCCINYLCHOLINE 20MG/ML (10ML) SYRINGE FOR MEDFUSION PUMP - OPTIME
INTRAMUSCULAR | Status: DC | PRN
  Administered 2016-08-17: 110 mg via INTRAVENOUS

## 2016-08-17 MED ORDER — SODIUM CHLORIDE 0.9 % IV SOLN
INTRAVENOUS | Status: DC
  Administered 2016-08-17: 17:00:00 via INTRAVENOUS

## 2016-08-17 MED ORDER — ONDANSETRON HCL 4 MG/2ML IJ SOLN
INTRAMUSCULAR | Status: AC
  Filled 2016-08-17: qty 2

## 2016-08-17 MED ORDER — MIDAZOLAM HCL 2 MG/2ML IJ SOLN
1.0000 mg | INTRAMUSCULAR | Status: DC
  Administered 2016-08-17 (×2): 2 mg via INTRAVENOUS

## 2016-08-17 MED ORDER — LIDOCAINE HCL (CARDIAC) 10 MG/ML IV SOLN
INTRAVENOUS | Status: DC | PRN
  Administered 2016-08-17: 40 mg via INTRAVENOUS

## 2016-08-17 MED ORDER — IOPAMIDOL (ISOVUE-300) INJECTION 61%
INTRAVENOUS | Status: AC
  Administered 2016-08-17: 17:00:00
  Filled 2016-08-17: qty 100

## 2016-08-17 MED ORDER — ONDANSETRON HCL 4 MG/2ML IJ SOLN
4.0000 mg | Freq: Once | INTRAMUSCULAR | Status: AC
  Administered 2016-08-17: 4 mg via INTRAVENOUS

## 2016-08-17 MED ORDER — FENTANYL CITRATE (PF) 100 MCG/2ML IJ SOLN
INTRAMUSCULAR | Status: DC | PRN
  Administered 2016-08-17 (×2): 50 ug via INTRAVENOUS

## 2016-08-17 MED ORDER — DEXTROSE 50 % IV SOLN
INTRAVENOUS | Status: AC
  Filled 2016-08-17: qty 50

## 2016-08-17 MED ORDER — FENTANYL CITRATE (PF) 250 MCG/5ML IJ SOLN
INTRAMUSCULAR | Status: AC
  Filled 2016-08-17: qty 5

## 2016-08-17 MED ORDER — SODIUM CHLORIDE 0.9% FLUSH
INTRAVENOUS | Status: AC
  Filled 2016-08-17: qty 10

## 2016-08-17 MED ORDER — DEXTROSE 50 % IV SOLN
12.5000 g | Freq: Once | INTRAVENOUS | Status: AC
  Administered 2016-08-17: 12.5 g via INTRAVENOUS

## 2016-08-17 NOTE — Anesthesia Procedure Notes (Signed)
Procedure Name: Intubation Date/Time: 08/17/2016 3:19 PM Performed by: Tressie Stalker E Pre-anesthesia Checklist: Patient identified, Patient being monitored, Timeout performed, Emergency Drugs available and Suction available Patient Re-evaluated:Patient Re-evaluated prior to inductionOxygen Delivery Method: Circle system utilized Preoxygenation: Pre-oxygenation with 100% oxygen Intubation Type: IV induction, Rapid sequence and Cricoid Pressure applied Ventilation: Mask ventilation without difficulty Laryngoscope Size: Mac and 3 Grade View: Grade I Tube type: Oral Tube size: 7.0 mm Number of attempts: 1 Airway Equipment and Method: Stylet Placement Confirmation: ETT inserted through vocal cords under direct vision,  positive ETCO2 and breath sounds checked- equal and bilateral Secured at: 21 cm Tube secured with: Tape Dental Injury: Teeth and Oropharynx as per pre-operative assessment

## 2016-08-17 NOTE — Consult Note (Signed)
Reason for Consult: abadominal pain Referring Physician: Hospitalist PCP: [facility]  Javier Walker is an 66 y.o. male.  HPI: Admitted thru the ED yesterday with c/o nausea, vomiting, dizziness and abdominal pain. Symptoms x 1 1/2 weeks.  Seen at [facility] x 2 for same. [place] at [facility] revealed slight thickening of the GB wall with multiple tiny gallstones.  No definite pericholecystic fluid. CBD 3.1. Noted in the ED at [facility] that transaminases were elevated. Underwent an MRCP yesterday. Results are pending.  Having pain RUQ. His appetite has been poor. Has had diarrhea since admission. C diff negative. Rarely drinks. Does not smoke. Print production planner.  Married.    Diabetic x 16 yrs and is followed by Dr. Dorris Fetch   Past Medical History:  Diagnosis Date  . Diabetes mellitus without complication ([place])   . GERD (gastroesophageal reflux disease)    occ-no meds  . Hyperlipemia   . Hypertension   . Shortness of breath     Past Surgical History:  Procedure Laterality Date  . COLON SURGERY  2011   gangrene lower colon with colostomy  . COLOSTOMY REVERSAL  2011  . OPEN REDUCTION INTERNAL FIXATION (ORIF) TIBIA/FIBULA FRACTURE Right 08/30/2012   Procedure: OPEN REDUCTION INTERNAL FIXATION (ORIF) TIBIA/FIBULA FRACTURE;  Surgeon: Wylene Simmer, MD;  Location: [facility];  Service: Orthopedics;  Laterality: Right;  . WOUND DEBRIDEMENT  2011   abd-    Family History  Problem Relation Age of Onset  . Heart attack Mother   . Heart attack Father     Social History:  reports that he quit smoking about 27 years ago. He has never used smokeless tobacco. He reports that he drinks alcohol. He reports that he does not use drugs.  Allergies: No Known Allergies  Medications: I have reviewed the patient's current medications.  Results for orders placed or performed during the hospital encounter of 08/16/16 (from the past 48 hour(s))  Urinalysis, Routine Walker  reflex microscopic     Status: Abnormal   Collection Time: 08/16/16  8:13 AM  Result Value Ref Range   Color, Urine YELLOW YELLOW   APPearance CLOUDY (A) CLEAR   Specific Gravity, Urine 1.020 1.005 - 1.030   pH 5.0 5.0 - 8.0   Glucose, UA 500 (A) NEGATIVE mg/dL   Hgb urine dipstick SMALL (A) NEGATIVE   Bilirubin Urine MODERATE (A) NEGATIVE   Ketones, ur TRACE (A) NEGATIVE mg/dL   Protein, ur 100 (A) NEGATIVE mg/dL   Nitrite NEGATIVE NEGATIVE   Leukocytes, UA MODERATE (A) NEGATIVE  Urinalysis, Microscopic (reflex)     Status: Abnormal   Collection Time: 08/16/16  8:13 AM  Result Value Ref Range   RBC / HPF 6-30 0 - 5 RBC/hpf   WBC, UA TOO NUMEROUS TO COUNT 0 - 5 WBC/hpf   Bacteria, UA MANY (A) NONE SEEN   Squamous Epithelial / LPF TOO NUMEROUS TO COUNT (A) NONE SEEN  Comprehensive metabolic panel     Status: Abnormal   Collection Time: 08/16/16  8:57 AM  Result Value Ref Range   Sodium 137 135 - 145 mmol/L   Potassium 4.2 3.5 - 5.1 mmol/L   Chloride 102 101 - 111 mmol/L   CO2 25 22 - 32 mmol/L   Glucose, Bld 173 (H) 65 - 99 mg/dL   BUN 38 (H) 6 - 20 mg/dL   Creatinine, Ser 3.62 (H) 0.61 - 1.24 mg/dL   Calcium 8.5 (L) 8.9 - 10.3 mg/dL  Total Protein 6.5 6.5 - 8.1 g/dL   Albumin 2.5 (L) 3.5 - 5.0 g/dL   AST 465 (H) 15 - 41 U/L   ALT 334 (H) 17 - 63 U/L   Alkaline Phosphatase 94 38 - 126 U/L   Total Bilirubin 8.6 (H) 0.3 - 1.2 mg/dL   GFR calc non Af Amer 16 (L) >60 mL/min   GFR calc Af Amer 19 (L) >60 mL/min    Comment: (NOTE) The eGFR has been calculated using the CKD EPI equation. This calculation has not been validated in all clinical situations. eGFR's persistently <60 mL/min signify possible Chronic Kidney Disease.    Anion gap 10 5 - 15  CBC with Differential/Platelet     Status: Abnormal   Collection Time: 08/16/16  8:57 AM  Result Value Ref Range   WBC 28.8 (H) 4.0 - 10.5 K/uL    Comment: WHITE COUNT CONFIRMED ON SMEAR   RBC 4.27 4.22 - 5.81 MIL/uL    Hemoglobin 12.4 (L) 13.0 - 17.0 g/dL   HCT 37.2 (L) 39.0 - 52.0 %   MCV 87.1 78.0 - 100.0 fL   MCH 29.0 26.0 - 34.0 pg   MCHC 33.3 30.0 - 36.0 g/dL   RDW 14.1 11.5 - 15.5 %   Platelets 267 150 - 400 K/uL    Comment: SPECIMEN CHECKED FOR CLOTS PLATELET COUNT CONFIRMED BY SMEAR    Neutrophils Relative % 89 %   Neutro Abs 25.7 (H) 1.7 - 7.7 K/uL   Lymphocytes Relative 7 %   Lymphs Abs 1.9 0.7 - 4.0 K/uL   Monocytes Relative 4 %   Monocytes Absolute 1.1 (H) 0.1 - 1.0 K/uL   Eosinophils Relative 0 %   Eosinophils Absolute 0.0 0.0 - 0.7 K/uL   Basophils Relative 0 %   Basophils Absolute 0.0 0.0 - 0.1 K/uL   WBC Morphology ATYPICAL LYMPHOCYTES     Comment: TOXIC GRANULATION SLIGHT VACULATED NEUTROPHILS   Lipase, blood     Status: None   Collection Time: 08/16/16  8:57 AM  Result Value Ref Range   Lipase 25 11 - 51 U/L  Troponin I     Status: None   Collection Time: 08/16/16  8:57 AM  Result Value Ref Range   Troponin I <0.03 <0.03 ng/mL  Bilirubin, direct     Status: Abnormal   Collection Time: 08/16/16  8:57 AM  Result Value Ref Range   Bilirubin, Direct 6.5 (H) 0.1 - 0.5 mg/dL  MRSA PCR Screening     Status: None   Collection Time: 08/16/16  3:55 PM  Result Value Ref Range   MRSA by PCR NEGATIVE NEGATIVE    Comment:        The GeneXpert MRSA Assay (FDA approved for NASAL specimens only), is one component of a comprehensive MRSA colonization surveillance program. It is not intended to diagnose MRSA infection nor to guide or monitor treatment for MRSA infections.   Culture, blood (x 2)     Status: None (Preliminary result)   Collection Time: 08/16/16  5:21 PM  Result Value Ref Range   Specimen Description LEFT ANTECUBITAL    Special Requests      BOTTLES DRAWN AEROBIC AND ANAEROBIC Blood Culture adequate volume   Culture PENDING    Report Status PENDING   Lactic acid, plasma     Status: None   Collection Time: 08/16/16  5:24 PM  Result Value Ref Range   Lactic  Acid, Venous 1.0 0.5 - 1.9 mmol/L  Procalcitonin     Status: None   Collection Time: 08/16/16  5:26 PM  Result Value Ref Range   Procalcitonin 38.93 ng/mL    Comment:        Interpretation: PCT >= 10 ng/mL: Important systemic inflammatory response, almost exclusively due to severe bacterial sepsis or septic shock. (NOTE)         ICU PCT Algorithm               Non ICU PCT Algorithm    ----------------------------     ------------------------------         PCT < 0.25 ng/mL                 PCT < 0.1 ng/mL     Stopping of antibiotics            Stopping of antibiotics       strongly encouraged.               strongly encouraged.    ----------------------------     ------------------------------       PCT level decrease by               PCT < 0.25 ng/mL       >= 80% from peak PCT       OR PCT 0.25 - 0.5 ng/mL          Stopping of antibiotics                                             encouraged.     Stopping of antibiotics           encouraged.    ----------------------------     ------------------------------       PCT level decrease by              PCT >= 0.25 ng/mL       < 80% from peak PCT        AND PCT >= 0.5 ng/mL             Continuing antibiotics                                              encouraged.       Continuing antibiotics            encouraged.    ----------------------------     ------------------------------     PCT level increase compared          PCT > 0.5 ng/mL         with peak PCT AND          PCT >= 0.5 ng/mL             Escalation of antibiotics                                          strongly encouraged.      Escalation of antibiotics        strongly encouraged.   APTT     Status: None   Collection Time: 08/16/16  5:26 PM  Result Value Ref Range   aPTT  31 24 - 36 seconds  Hepatitis panel, acute     Status: None   Collection Time: 08/16/16  5:26 PM  Result Value Ref Range   Hepatitis B Surface Ag Negative Negative   HCV Ab <0.1 0.0 - 0.9 s/co  ratio    Comment: (NOTE)                                  Negative:     < 0.8                             Indeterminate: 0.8 - 0.9                                  Positive:     > 0.9 The CDC recommends that a positive HCV antibody result be followed up with a HCV Nucleic Acid Amplification test (053976). Performed At: [facility] [place], [place] [id number] Lindon Romp MD [place]:[id number]    Hep A IgM Negative Negative   Hep B C IgM Negative Negative  Protime-INR     Status: None   Collection Time: 08/16/16  5:26 PM  Result Value Ref Range   Prothrombin Time 15.1 11.4 - 15.2 seconds   INR 1.18   Culture, blood (x 2)     Status: None (Preliminary result)   Collection Time: 08/16/16  5:31 PM  Result Value Ref Range   Specimen Description RIGHT ANTECUBITAL    Special Requests      BOTTLES DRAWN AEROBIC AND ANAEROBIC Blood Culture adequate volume   Culture PENDING    Report Status PENDING   Acetaminophen level     Status: Abnormal   Collection Time: 08/16/16  5:33 PM  Result Value Ref Range   Acetaminophen (Tylenol), Serum <10 (L) 10 - 30 ug/mL    Comment:        THERAPEUTIC CONCENTRATIONS VARY SIGNIFICANTLY. A RANGE OF 10-30 ug/mL MAY BE AN EFFECTIVE CONCENTRATION FOR MANY PATIENTS. HOWEVER, SOME ARE BEST TREATED AT CONCENTRATIONS OUTSIDE THIS RANGE. ACETAMINOPHEN CONCENTRATIONS >150 ug/mL AT 4 HOURS AFTER INGESTION AND >50 ug/mL AT 12 HOURS AFTER INGESTION ARE OFTEN ASSOCIATED WITH TOXIC REACTIONS.   Glucose, capillary     Status: None   Collection Time: 08/16/16  6:36 PM  Result Value Ref Range   Glucose-Capillary 94 65 - 99 mg/dL  Lactic acid, plasma     Status: None   Collection Time: 08/16/16  7:39 PM  Result Value Ref Range   Lactic Acid, Venous 0.8 0.5 - 1.9 mmol/L  Glucose, capillary     Status: None   Collection Time: 08/16/16  9:38 PM  Result Value Ref Range   Glucose-Capillary 93 65 - 99 mg/dL  C difficile quick scan Walker PCR  reflex     Status: None   Collection Time: 08/17/16  3:15 AM  Result Value Ref Range   C Diff antigen NEGATIVE NEGATIVE   C Diff toxin NEGATIVE NEGATIVE   C Diff interpretation No C. difficile detected.   Comprehensive metabolic panel     Status: Abnormal   Collection Time: 08/17/16  4:10 AM  Result Value Ref Range   Sodium 139 135 - 145 mmol/L   Potassium 4.0 3.5 - 5.1 mmol/L   Chloride 107 101 - 111 mmol/L  CO2 22 22 - 32 mmol/L   Glucose, Bld 83 65 - 99 mg/dL   BUN 34 (H) 6 - 20 mg/dL   Creatinine, Ser 2.23 (H) 0.61 - 1.24 mg/dL    Comment: DELTA CHECK NOTED   Calcium 8.5 (L) 8.9 - 10.3 mg/dL   Total Protein 6.4 (L) 6.5 - 8.1 g/dL   Albumin 2.4 (L) 3.5 - 5.0 g/dL   AST 303 (H) 15 - 41 U/L   ALT 328 (H) 17 - 63 U/L   Alkaline Phosphatase 90 38 - 126 U/L   Total Bilirubin 7.1 (H) 0.3 - 1.2 mg/dL   GFR calc non Af Amer 29 (L) >60 mL/min   GFR calc Af Amer 34 (L) >60 mL/min    Comment: (NOTE) The eGFR has been calculated using the CKD EPI equation. This calculation has not been validated in all clinical situations. eGFR's persistently <60 mL/min signify possible Chronic Kidney Disease.    Anion gap 10 5 - 15  CBC     Status: Abnormal   Collection Time: 08/17/16  4:10 AM  Result Value Ref Range   WBC 18.7 (H) 4.0 - 10.5 K/uL   RBC 4.11 (L) 4.22 - 5.81 MIL/uL   Hemoglobin 12.0 (L) 13.0 - 17.0 g/dL   HCT 35.8 (L) 39.0 - 52.0 %   MCV 87.1 78.0 - 100.0 fL   MCH 29.2 26.0 - 34.0 pg   MCHC 33.5 30.0 - 36.0 g/dL   RDW 14.6 11.5 - 15.5 %   Platelets 262 150 - 400 K/uL  Glucose, capillary     Status: None   Collection Time: 08/17/16  7:32 AM  Result Value Ref Range   Glucose-Capillary 78 65 - 99 mg/dL    Ct Abdomen Pelvis Wo Contrast  Result Date: 08/16/2016 CLINICAL DATA:  Abdominal pain, nausea and vomiting intermittently for weeks. Reported history of cholelithiasis. EXAM: CT ABDOMEN AND PELVIS WITHOUT CONTRAST TECHNIQUE: Multidetector CT imaging of the abdomen and pelvis  was performed following the standard protocol without IV contrast. COMPARISON:  None. FINDINGS: Lower chest: Nonspecific patchy subpleural reticulation at both lung bases with mild traction bronchiolectasis. Left lower lobe 2 mm solid pulmonary nodule (series 4/image 16). Coronary atherosclerosis. Hepatobiliary: Mild diffuse hepatic steatosis. No liver mass. Questionable fine liver surface irregularity. Cholelithiasis. No gallbladder wall thickening, gallbladder distention or pericholecystic fluid. No biliary ductal dilatation. Pancreas: Heterogeneous fatty infiltration of the pancreas, with no pancreatic mass or duct dilation. Spleen: Normal size. No mass. Adrenals/Urinary Tract: Normal adrenals. No hydronephrosis. No renal stones. Exophytic 0.7 cm renal cortical lesion in the lateral upper left kidney, too small to characterize, for which no further follow-up is required. No additional contour deforming renal mass. Normal caliber ureters, with no ureteral stones. Normal bladder. Stomach/Bowel: Grossly normal stomach. There is a small periumbilical hernia containing a tiny portion of a mid small bowel loop (series 2/ image 64). Normal caliber small bowel. No small bowel wall thickening or pneumatosis. Normal appendix. Oral contrast reaches the distal rectum. Normal large bowel with no diverticulosis, large bowel wall thickening or pericolonic fat stranding. Vascular/Lymphatic: Atherosclerotic nonaneurysmal abdominal aorta. No pathologically enlarged lymph nodes in the abdomen or pelvis. Reproductive: Top-normal size prostate with nonspecific internal prostatic calcifications. Other: No pneumoperitoneum, ascites or focal fluid collection. Musculoskeletal: No aggressive appearing focal osseous lesions. Mild thoracolumbar spondylosis. IMPRESSION: 1. Small periumbilical hernia containing a tiny portion of a mid small bowel loop, with no evidence of bowel obstruction or ischemia. 2. Diffuse hepatic steatosis.  Question  fine liver surface irregularity, cannot exclude early cirrhosis. Consider hepatic elastography for further liver fibrosis risk stratification, as clinically warranted. 3. Nonspecific findings at the lung bases that may indicate interstitial lung disease. Outpatient high-resolution chest CT may be obtained as clinically warranted. 4. Additional findings include cholelithiasis, aortic atherosclerosis and coronary atherosclerosis. Electronically Signed   By: Ilona Sorrel M.D.   On: 08/16/2016 12:30   Dg Chest Port 1 View  Result Date: 08/16/2016 CLINICAL DATA:  Shortness of breath and abdominal pain EXAM: PORTABLE CHEST 1 VIEW COMPARISON:  05/19/2009 FINDINGS: Cardiac shadow is at the upper limits of normal in size accentuated by the portable technique. Lungs are clear bilaterally. No acute bony abnormalities is noted. IMPRESSION: No active disease. Electronically Signed   By: Inez Catalina M.D.   On: 08/16/2016 14:14    Review of Systems  Neurological: Weakness:     Blood pressure 105/64, pulse 74, temperature 98.4 F (36.9 C), temperature source Oral, resp. rate (!) 21, height _0  (1.803 m), weight 210 lb 15.7 oz (95.7 kg), SpO2 96 %. Physical Exam   Alert and oriented. Skin warm and dry.  Skin color yellow. Oral mucosa is moist.   . Sclera icteric, conjunctivae is pink. Thyroid not enlarged. No cervical lymphadenopathy. Lungs clear. Heart regular rate and rhythm.  Abdomen is soft. Bowel sounds are positive. No hepatomegaly. No abdominal masses felt. RUQ tenderness.  No edema to lower extremities.    Assessment/Plan: Elevated Liver enzymes. Possible CBD stone. MRCP results pending. Acute Hepatitis markers negative. Dr. Laural Golden is aware.  Keep patient NPO. Further recommendations to follow.    Javier Walker 08/17/2016, 7:43 AM

## 2016-08-17 NOTE — Transfer of Care (Signed)
Immediate Anesthesia Transfer of Care Note  Patient: Candis SchatzWilliam Holston  Procedure(s) Performed: Procedure(s): ENDOSCOPIC RETROGRADE CHOLANGIOPANCREATOGRAPHY (ERCP) Biliary sphincterotomy and stone extraction (N/A)  Patient Location: PACU  Anesthesia Type:General  Level of Consciousness: awake and alert   Airway & Oxygen Therapy: Patient Spontanous Breathing  Post-op Assessment: Report given to RN  Post vital signs: Reviewed  Last Vitals:  Vitals:   08/17/16 1403 08/17/16 1602  BP: 120/69   Pulse: 74   Resp: (!) 26 18  Temp: 36.6 C 36.6 C    Last Pain:  Vitals:   08/17/16 1403  TempSrc: Oral  PainSc:       Patients Stated Pain Goal: 1 (08/16/16 1600)  Complications: No apparent anesthesia complications

## 2016-08-17 NOTE — Op Note (Signed)
[facility] Patient Name: Javier SchatzWilliam Walker Procedure Date: 08/17/2016 3:09 PM MRN: [id number] Date of Birth: September 22, 1950 Attending MD: Lionel DecemberNajeeb Rehman , MD CSN: [id number] Age: [age] Admit Type: Inpatient Procedure:                ERCP Indications:              For therapy of bile duct stone(s), Ascending                            cholangitis Providers:                Lionel DecemberNajeeb Rehman, MD, Rebeca AlertMelanie Ambrose RN, RN, Toniann FailBonnie                            Pritchett RN, RN, Burke Keelsrisann Tilley, Technician Referring MD:             Starleen Armsawood S. Elgergawy, MD Medicines:                General Anesthesia Complications:            No immediate complications. Estimated Blood Loss:     Estimated blood loss: none. Procedure:                Pre-Anesthesia Assessment:                           - Prior to the procedure, a History and Physical                            was performed, and patient medications and                            allergies were reviewed. The patient's tolerance of                            previous anesthesia was also reviewed. The risks                            and benefits of the procedure and the sedation                            options and risks were discussed with the patient.                            All questions were answered, and informed consent                            was obtained. Prior Anticoagulants: The patient                            last took aspirin 1 day prior to the procedure and                            last took heparin on the day of the procedure. ASA  Grade Assessment: II - A patient with mild systemic                            disease. After reviewing the risks and benefits,                            the patient was deemed in satisfactory condition to                            undergo the procedure.                           After obtaining informed consent, the scope was                            passed under direct  vision. Throughout the                            procedure, the patient's blood pressure, pulse, and                            oxygen saturations were monitored continuously. The                            ZO-[id number] ([id number]) scope was introduced through                            the mouth, and used to inject contrast into and                            used to inject contrast into the bile duct. The                            ERCP was accomplished without difficulty. The                            patient tolerated the procedure well. Scope In: 3:34:29 PM Scope Out: 3:44:20 PM Total Procedure Duration: 0 hours 9 minutes 51 seconds  Findings:      The scout film was normal. The esophagus was successfully intubated       under direct vision. small bulbar ulcer s noted along with multiple       erosionsat angle of the duodenum. The scope was advanced to a normal       major papilla in the descending duodenum without detailed examination of       the pharynx, larynx and associated structures, and upper GI tract. The       upper GI tract was grossly normal. 0.035 inch x 260 cm straight Hydra       Jagwire was passed into the biliary tree. The Autotome sphincterotome       was passed over the guidewire and the bile duct was then deeply       cannulated. Contrast was injected. Choledocholithiasis was found in a       nondilated duct. The biliary tree was otherwise normal. An 8 mm biliary  sphincterotomy was made with a braided Autotome sphincterotome using       ERBE electrocautery. There was no post-sphincterotomy bleeding. The       biliary tree was swept with a basket starting at the bifurcation. One       stone was removed. No stones remained. Impression:               - Bulbar ulcer and erosions.                           - Choledocholithiasis was found. Complete removal                            was accomplished by biliary sphincterotomy and                            basket  extraction.                           - A biliary sphincterotomy was performed.                           - The biliary tree was swept. Moderate Sedation:      Per Anesthesia Care Recommendation:           - Return patient to ICU for ongoing care.                           - Avoid aspirin, nonsteroidal anti-inflammatory                            medicines and anticoagulants for 3 days.                           - Continue present medications.                           - Clear liquid diet.                           - H. pylori serology.                           - Continue PPI.                           - Surgical consultation for cholecystectomy. Procedure Code(s):        --- Professional ---                           [id number], Endoscopic retrograde                            cholangiopancreatography (ERCP); with removal of                            calculi/debris from biliary/pancreatic duct(s)  16109, Endoscopic retrograde                            cholangiopancreatography (ERCP); with                            sphincterotomy/papillotomy Diagnosis Code(s):        --- Professional ---                           K80.30, Calculus of bile duct with cholangitis,                            unspecified, without obstruction                           K83.0, Cholangitis CPT copyright 2016 [employer]. All rights reserved. The codes documented in this report are preliminary and upon coder review may  be revised to meet current compliance requirements. Lionel December, MD Lionel December, MD 08/17/2016 4:28:39 PM This report has been signed electronically. Number of Addenda: 0

## 2016-08-17 NOTE — Progress Notes (Signed)
PROGRESS NOTE                                                                                                                                                                                                             Patient Demographics:    Javier Walker, is a 66 y.o. male, DOB - [birth year], ZOX:[id number]  Admit date - 08/16/2016   Admitting Physician Erick Blinks, MD  Outpatient Primary MD for the patient is [facility]  LOS - 1   Chief Complaint  Patient presents with  . Dizziness       Brief Narrative   66 y.o. male with medical history significant of hypertension, diabetes, hyperlipidemia, presents to the ER with complaints of abdominal pain for the past 10 days, workup significant for elevated LFTs secondary to cholangitis.   Subjective:    Javier Walker today has, No headache, No chest pain,Still reports diarrhea, abdominal pain still present, but improved, no nausea or vomiting.    Assessment  & Plan :    Active Problems:   Uncontrolled type 2 diabetes mellitus with complication, without long-term current use of insulin (HCC)   Essential hypertension, benign   Hypercholesteremia   Sepsis (HCC)   AKI (acute kidney injury) (HCC)   Dehydration   Elevated LFTs   Jaundice   UTI (urinary tract infection)  Elevated LFTs secondary to cholangitis - Patient presents with right upper quadrant pain, elevated LFTs, with hyperbilirubinemia, white count of 20 8.8K, MRCP significant for cholelithiasis/choledocholithiasis with 4 mm stone in  distal common bile duct. - GI consult greatly appreciated, plan for ERCP today. - Discussed with General surgery Dr. Lovell Sheehan, who will evaluate patient after procedure to determine timing of surgery(inpatient versus outpatient) - Continue with IV Zosyn, follow blood cultures  Acute renal failure - This is most likely related to volume depletion, dehydration, probably ATN from  infectious process. - Improving with IV fluid, continue at 100 mL/h, monitor BMP daily  Abnormal urinalysis - Urine white blood cells too numerous to count, follow on urine cultures, he is already on Zosyn secondary to cholangitis  Diarrhea - C. difficile negative  Diabetes mellitus -  continue to hold oral hypoglycemic agents, and continue with insulin sliding scale  Hyperlipidemia - Continue to hold statin secondary to elevated liver enzymes  Hypertension - Both pressure soft  presentation, currently improved with IV hydration, but continued to lisinopril in the setting of acute renal failure .    Code Status : Full  Family Communication  : Wife at bedside  Disposition Plan  : Home when stable  Consults  :  GI, D/W Dr Lovell Sheehan from gen surgery  Procedures  : ERCP planned for Today.  DVT Prophylaxis  :  SCDs   Lab Results  Component Value Date   PLT 262 08/17/2016    Antibiotics  :    Anti-infectives    Start     Dose/Rate Route Frequency Ordered Stop   08/16/16 1730  [MAR Hold]  piperacillin-tazobactam (ZOSYN) IVPB 3.375 g     (MAR Hold since 08/17/16 1354)   3.375 g 12.5 mL/hr over 240 Minutes Intravenous Every 8 hours 08/16/16 1719     08/16/16 1715  piperacillin-tazobactam (ZOSYN) IVPB 3.375 g  Status:  Discontinued     3.375 g 100 mL/hr over 30 Minutes Intravenous  Once 08/16/16 1714 08/16/16 1718   08/16/16 1345  cefTRIAXone (ROCEPHIN) 1 g in dextrose 5 % 50 mL IVPB     1 g 100 mL/hr over 30 Minutes Intravenous  Once 08/16/16 1335 08/16/16 1518        Objective:   Vitals:   08/17/16 0753 08/17/16 0800 08/17/16 0900 08/17/16 1403  BP:  118/63 119/66 120/69  Pulse:  75 72 74  Resp:  (!) 24 19 (!) 26  Temp: 97.6 F (36.4 C)   97.8 F (36.6 C)  TempSrc: Oral   Oral  SpO2:  97% 97% 97%  Weight:      Height:        Wt Readings from Last 3 Encounters:  08/17/16 95.7 kg (210 lb 15.7 oz)  05/05/16 97.5 kg (215 lb)  03/16/16 99.8 kg (220 lb)      Intake/Output Summary (Last 24 hours) at 08/17/16 1446 Last data filed at 08/17/16 0934  Gross per 24 hour  Intake          1569.17 ml  Output             1800 ml  Net          -230.83 ml     Physical Exam  Awake Alert, Oriented X 3, No new F.N deficits, Normal affect, Jaundiced Supple Neck,No JVD,   Symmetrical Chest wall movement, Good air movement bilaterally, CTAB RRR,No Gallops,Rubs or new Murmurs, No Parasternal Heave +ve B.Sounds, Abd Soft, some tenderness in epigastric and right upper quadrant area, No rebound - guarding or rigidity. No Cyanosis, Clubbing or edema, No new Rash or bruise      Data Review:    CBC  Recent Labs Lab 08/16/16 0857 08/17/16 0410  WBC 28.8* 18.7*  HGB 12.4* 12.0*  HCT 37.2* 35.8*  PLT 267 262  MCV 87.1 87.1  MCH 29.0 29.2  MCHC 33.3 33.5  RDW 14.1 14.6  LYMPHSABS 1.9  --   MONOABS 1.1*  --   EOSABS 0.0  --   BASOSABS 0.0  --     Chemistries   Recent Labs Lab 08/16/16 0857 08/17/16 0410  NA 137 139  K 4.2 4.0  CL 102 107  CO2 25 22  GLUCOSE 173* 83  BUN 38* 34*  CREATININE 3.62* 2.23*  CALCIUM 8.5* 8.5*  AST 465* 303*  ALT 334* 328*  ALKPHOS 94 90  BILITOT 8.6* 7.1*   ------------------------------------------------------------------------------------------------------------------ No results for input(s): CHOL, HDL, LDLCALC, TRIG, CHOLHDL, LDLDIRECT  in the last 72 hours.  Lab Results  Component Value Date   HGBA1C 6.7 (H) 04/26/2016   ------------------------------------------------------------------------------------------------------------------ No results for input(s): TSH, T4TOTAL, T3FREE, THYROIDAB in the last 72 hours.  Invalid input(s): FREET3 ------------------------------------------------------------------------------------------------------------------ No results for input(s): VITAMINB12, FOLATE, FERRITIN, TIBC, IRON, RETICCTPCT in the last 72 hours.  Coagulation profile  Recent Labs Lab  08/16/16 1726  INR 1.18    No results for input(s): DDIMER in the last 72 hours.  Cardiac Enzymes  Recent Labs Lab 08/16/16 0857  TROPONINI <0.03   ------------------------------------------------------------------------------------------------------------------ No results found for: BNP  Inpatient Medications  Scheduled Meds: . [MAR Hold] aspirin  81 mg Oral Daily  . glucagon (human recombinant)      . [MAR Hold] insulin aspart  0-15 Units Subcutaneous TID WC  . [MAR Hold] insulin aspart  0-5 Units Subcutaneous QHS  . iopamidol      . midazolam  1-2 mg Intravenous Q5 min   Continuous Infusions: . sodium chloride 100 mL/hr at 08/16/16 1843  . lactated ringers 75 mL/hr at 08/17/16 1437  . [MAR Hold] piperacillin-tazobactam (ZOSYN)  IV Stopped (08/17/16 1348)   PRN Meds:.[MAR Hold] acetaminophen **OR** [MAR Hold] acetaminophen, [MAR Hold] ondansetron **OR** [MAR Hold] ondansetron (ZOFRAN) IV  Micro Results Recent Results (from the past 240 hour(s))  MRSA PCR Screening     Status: None   Collection Time: 08/16/16  3:55 PM  Result Value Ref Range Status   MRSA by PCR NEGATIVE NEGATIVE Final    Comment:        The GeneXpert MRSA Assay (FDA approved for NASAL specimens only), is one component of a comprehensive MRSA colonization surveillance program. It is not intended to diagnose MRSA infection nor to guide or monitor treatment for MRSA infections.   Culture, blood (x 2)     Status: None (Preliminary result)   Collection Time: 08/16/16  5:21 PM  Result Value Ref Range Status   Specimen Description LEFT ANTECUBITAL  Final   Special Requests   Final    BOTTLES DRAWN AEROBIC AND ANAEROBIC Blood Culture adequate volume   Culture NO GROWTH < 24 HOURS  Final   Report Status PENDING  Incomplete  Culture, blood (x 2)     Status: None (Preliminary result)   Collection Time: 08/16/16  5:31 PM  Result Value Ref Range Status   Specimen Description RIGHT ANTECUBITAL  Final    Special Requests   Final    BOTTLES DRAWN AEROBIC AND ANAEROBIC Blood Culture adequate volume   Culture NO GROWTH < 24 HOURS  Final   Report Status PENDING  Incomplete  C difficile quick scan w PCR reflex     Status: None   Collection Time: 08/17/16  3:15 AM  Result Value Ref Range Status   C Diff antigen NEGATIVE NEGATIVE Final   C Diff toxin NEGATIVE NEGATIVE Final   C Diff interpretation No C. difficile detected.  Final    Radiology Reports Ct Abdomen Pelvis Wo Contrast  Result Date: 08/16/2016 CLINICAL DATA:  Abdominal pain, nausea and vomiting intermittently for weeks. Reported history of cholelithiasis. EXAM: CT ABDOMEN AND PELVIS WITHOUT CONTRAST TECHNIQUE: Multidetector CT imaging of the abdomen and pelvis was performed following the standard protocol without IV contrast. COMPARISON:  None. FINDINGS: Lower chest: Nonspecific patchy subpleural reticulation at both lung bases with mild traction bronchiolectasis. Left lower lobe 2 mm solid pulmonary nodule (series 4/image 16). Coronary atherosclerosis. Hepatobiliary: Mild diffuse hepatic steatosis. No liver mass. Questionable fine liver surface  irregularity. Cholelithiasis. No gallbladder wall thickening, gallbladder distention or pericholecystic fluid. No biliary ductal dilatation. Pancreas: Heterogeneous fatty infiltration of the pancreas, with no pancreatic mass or duct dilation. Spleen: Normal size. No mass. Adrenals/Urinary Tract: Normal adrenals. No hydronephrosis. No renal stones. Exophytic 0.7 cm renal cortical lesion in the lateral upper left kidney, too small to characterize, for which no further follow-up is required. No additional contour deforming renal mass. Normal caliber ureters, with no ureteral stones. Normal bladder. Stomach/Bowel: Grossly normal stomach. There is a small periumbilical hernia containing a tiny portion of a mid small bowel loop (series 2/ image 64). Normal caliber small bowel. No small bowel wall thickening or  pneumatosis. Normal appendix. Oral contrast reaches the distal rectum. Normal large bowel with no diverticulosis, large bowel wall thickening or pericolonic fat stranding. Vascular/Lymphatic: Atherosclerotic nonaneurysmal abdominal aorta. No pathologically enlarged lymph nodes in the abdomen or pelvis. Reproductive: Top-normal size prostate with nonspecific internal prostatic calcifications. Other: No pneumoperitoneum, ascites or focal fluid collection. Musculoskeletal: No aggressive appearing focal osseous lesions. Mild thoracolumbar spondylosis. IMPRESSION: 1. Small periumbilical hernia containing a tiny portion of a mid small bowel loop, with no evidence of bowel obstruction or ischemia. 2. Diffuse hepatic steatosis. Question fine liver surface irregularity, cannot exclude early cirrhosis. Consider hepatic elastography for further liver fibrosis risk stratification, as clinically warranted. 3. Nonspecific findings at the lung bases that may indicate interstitial lung disease. Outpatient high-resolution chest CT may be obtained as clinically warranted. 4. Additional findings include cholelithiasis, aortic atherosclerosis and coronary atherosclerosis. Electronically Signed   By: Delbert Phenix M.D.   On: 08/16/2016 12:30   Mr Abdomen Mrcp Wo Contrast  Result Date: 08/17/2016 CLINICAL DATA:  Abdominal pain and elevated liver function tests. EXAM: MRI ABDOMEN WITHOUT CONTRAST  (INCLUDING MRCP) TECHNIQUE: Multiplanar multisequence MR imaging of the abdomen was performed. Heavily T2-weighted images of the biliary and pancreatic ducts were obtained, and three-dimensional MRCP images were rendered by post processing. COMPARISON:  08/16/2016 CT scan FINDINGS: Despite efforts by the technologist and patient, motion artifact is present on today's exam and could not be eliminated. This reduces exam sensitivity and specificity. Lower chest: Interstitial accentuation at the lung bases better shown on recent CT. Hepatobiliary:  Mild diffuse hepatic steatosis. Morphologic findings in the liver supportive of early cirrhosis. Subtle geographic T2 hyperintensity posteriorly in the right hepatic lobe without a well-defined associated mass, some of this could be due to fibrosis. And some is probably due to relative fatty sparing. I do not see a discrete mass; IV contrast was not administered. Numerous small gallstones in the 2-4 mm range are present dependently within the gallbladder. Normal caliber biliary tree on image 7/15 and image 1/6 there is a suggestion of a 4 mm filling defect in the distal common bile duct. , suspicious for choledocholithiasis. Images 48-49 of series 12 provide a supportive appearance. Likely small exophytic 7 mm cyst adjacent to the posterior aspect of the right hepatic lobe on image 41/4. Pancreas:  Unremarkable Spleen:  Unremarkable Adrenals/Urinary Tract: Stable 6 mm exophytic left kidney upper pole lesion with low T2 signal, probably a tiny complex cyst. Unchanged from CT. Otherwise unremarkable. Stomach/Bowel: Unremarkable Vascular/Lymphatic: Scattered small celiac and peripancreatic lymph nodes are not overtly pathologically enlarged by size criteria a portacaval node measures 1.2 cm in short axis. Aortoiliac atherosclerotic vascular disease. Other:  No supplemental non-categorized findings. Musculoskeletal: Probable small hemangioma in the L4 vertebral body on image 13/3. IMPRESSION: 1. Cholelithiasis along with a 4 mm distal CBD  stone compatible with choledocholithiasis. No biliary dilatation. 2. Geographic hepatic steatosis. There is some mild sparing posteriorly in the right hepatic lobe. Difficult to completely exclude the possibility of low-level fibrosis posteriorly in the right hepatic lobe. No obvious hepatic mass on noncontrast imaging. 3. There is some mild early morphologic findings of cirrhosis in the liver as suggested on CT. 4. Interstitial accentuation in the lung bases. 5. Small likely complex  cyst of the left kidney upper pole, about 6 mm in diameter. Most likely incidental. 6.  Aortic Atherosclerosis (ICD10-I70.0). Electronically Signed   By: Gaylyn Rong M.D.   On: 08/17/2016 08:01   Mr 3d Recon At Scanner  Result Date: 08/17/2016 CLINICAL DATA:  Abdominal pain and elevated liver function tests. EXAM: MRI ABDOMEN WITHOUT CONTRAST  (INCLUDING MRCP) TECHNIQUE: Multiplanar multisequence MR imaging of the abdomen was performed. Heavily T2-weighted images of the biliary and pancreatic ducts were obtained, and three-dimensional MRCP images were rendered by post processing. COMPARISON:  08/16/2016 CT scan FINDINGS: Despite efforts by the technologist and patient, motion artifact is present on today's exam and could not be eliminated. This reduces exam sensitivity and specificity. Lower chest: Interstitial accentuation at the lung bases better shown on recent CT. Hepatobiliary: Mild diffuse hepatic steatosis. Morphologic findings in the liver supportive of early cirrhosis. Subtle geographic T2 hyperintensity posteriorly in the right hepatic lobe without a well-defined associated mass, some of this could be due to fibrosis. And some is probably due to relative fatty sparing. I do not see a discrete mass; IV contrast was not administered. Numerous small gallstones in the 2-4 mm range are present dependently within the gallbladder. Normal caliber biliary tree on image 7/15 and image 1/6 there is a suggestion of a 4 mm filling defect in the distal common bile duct. , suspicious for choledocholithiasis. Images 48-49 of series 12 provide a supportive appearance. Likely small exophytic 7 mm cyst adjacent to the posterior aspect of the right hepatic lobe on image 41/4. Pancreas:  Unremarkable Spleen:  Unremarkable Adrenals/Urinary Tract: Stable 6 mm exophytic left kidney upper pole lesion with low T2 signal, probably a tiny complex cyst. Unchanged from CT. Otherwise unremarkable. Stomach/Bowel: Unremarkable  Vascular/Lymphatic: Scattered small celiac and peripancreatic lymph nodes are not overtly pathologically enlarged by size criteria a portacaval node measures 1.2 cm in short axis. Aortoiliac atherosclerotic vascular disease. Other:  No supplemental non-categorized findings. Musculoskeletal: Probable small hemangioma in the L4 vertebral body on image 13/3. IMPRESSION: 1. Cholelithiasis along with a 4 mm distal CBD stone compatible with choledocholithiasis. No biliary dilatation. 2. Geographic hepatic steatosis. There is some mild sparing posteriorly in the right hepatic lobe. Difficult to completely exclude the possibility of low-level fibrosis posteriorly in the right hepatic lobe. No obvious hepatic mass on noncontrast imaging. 3. There is some mild early morphologic findings of cirrhosis in the liver as suggested on CT. 4. Interstitial accentuation in the lung bases. 5. Small likely complex cyst of the left kidney upper pole, about 6 mm in diameter. Most likely incidental. 6.  Aortic Atherosclerosis (ICD10-I70.0). Electronically Signed   By: Gaylyn Rong M.D.   On: 08/17/2016 08:01   Dg Chest Port 1 View  Result Date: 08/16/2016 CLINICAL DATA:  Shortness of breath and abdominal pain EXAM: PORTABLE CHEST 1 VIEW COMPARISON:  05/19/2009 FINDINGS: Cardiac shadow is at the upper limits of normal in size accentuated by the portable technique. Lungs are clear bilaterally. No acute bony abnormalities is noted. IMPRESSION: No active disease. Electronically Signed  By: Alcide Clever M.D.   On: 08/16/2016 14:14     Jahna Liebert M.D on 08/17/2016 at 2:46 PM  Between 7am to 7pm - Pager - [phone]  After 7pm go to [email or web address] - password [facility]  [facility] -  Office  [phone]

## 2016-08-17 NOTE — Anesthesia Postprocedure Evaluation (Signed)
Anesthesia Post Note  Patient: Javier SchatzWilliam Walker  Procedure(s) Performed: Procedure(s) (LRB): ENDOSCOPIC RETROGRADE CHOLANGIOPANCREATOGRAPHY (ERCP) Biliary sphincterotomy and stone extraction (N/A)  Patient location during evaluation: ICU Anesthesia Type: General Level of consciousness: awake and alert and oriented Pain management: pain level controlled Vital Signs Assessment: post-procedure vital signs reviewed and stable Respiratory status: spontaneous breathing Cardiovascular status: blood pressure returned to baseline Postop Assessment: no signs of nausea or vomiting Anesthetic complications: no     Last Vitals:  Vitals:   08/17/16 1605 08/17/16 1610  BP: (!) 97/58   Pulse: 80 78  Resp: (!) 21 18  Temp:      Last Pain:  Vitals:   08/17/16 1602  TempSrc:   PainSc: 0-No pain                 Benigno Check

## 2016-08-17 NOTE — Anesthesia Preprocedure Evaluation (Signed)
Anesthesia Evaluation  Patient identified by MRN, date of birth, ID band Patient awake    Reviewed: Allergy & Precautions, H&P , NPO status , Patient's Chart, lab work & pertinent test results  Airway Mallampati: II  TM Distance: >3 FB Neck ROM: Full    Dental  (+) Teeth Intact, Dental Advisory Given   Pulmonary shortness of breath, former smoker,    breath sounds clear to auscultation       Cardiovascular hypertension, Pt. on medications  Rhythm:Regular Rate:Normal     Neuro/Psych    GI/Hepatic GERD  Medicated,  Endo/Other  diabetes, Type 2, Oral Hypoglycemic Agents  Renal/GU      Musculoskeletal   Abdominal   Peds  Hematology   Anesthesia Other Findings CBD stones, cholangitis  Reproductive/Obstetrics                             Anesthesia Physical Anesthesia Plan  ASA: III  Anesthesia Plan: General   Post-op Pain Management:    Induction: Intravenous, Rapid sequence and Cricoid pressure planned  Airway Management Planned: Oral ETT  Additional Equipment:   Intra-op Plan:   Post-operative Plan: Extubation in OR  Informed Consent: I have reviewed the patients History and Physical, chart, labs and discussed the procedure including the risks, benefits and alternatives for the proposed anesthesia with the patient or authorized representative who has indicated his/her understanding and acceptance.     Plan Discussed with:   Anesthesia Plan Comments:         Anesthesia Quick Evaluation

## 2016-08-18 DIAGNOSIS — N390 Urinary tract infection, site not specified: Secondary | ICD-10-CM

## 2016-08-18 DIAGNOSIS — K8071 Calculus of gallbladder and bile duct without cholecystitis with obstruction: Secondary | ICD-10-CM

## 2016-08-18 DIAGNOSIS — K8031 Calculus of bile duct with cholangitis, unspecified, with obstruction: Secondary | ICD-10-CM

## 2016-08-18 LAB — COMPREHENSIVE METABOLIC PANEL
ALT: 216 U/L — ABNORMAL HIGH (ref 17–63)
AST: 121 U/L — AB (ref 15–41)
Albumin: 2.3 g/dL — ABNORMAL LOW (ref 3.5–5.0)
Alkaline Phosphatase: 114 U/L (ref 38–126)
Anion gap: 8 (ref 5–15)
BUN: 26 mg/dL — AB (ref 6–20)
CHLORIDE: 109 mmol/L (ref 101–111)
CO2: 22 mmol/L (ref 22–32)
Calcium: 8.5 mg/dL — ABNORMAL LOW (ref 8.9–10.3)
Creatinine, Ser: 1.57 mg/dL — ABNORMAL HIGH (ref 0.61–1.24)
GFR calc Af Amer: 52 mL/min — ABNORMAL LOW (ref >60)
GFR, EST NON AFRICAN AMERICAN: 45 mL/min — AB (ref >60)
Glucose, Bld: 85 mg/dL (ref 65–99)
Potassium: 4 mmol/L (ref 3.5–5.1)
SODIUM: 139 mmol/L (ref 135–145)
Total Bilirubin: 5.1 mg/dL — ABNORMAL HIGH (ref 0.3–1.2)
Total Protein: 6.4 g/dL — ABNORMAL LOW (ref 6.5–8.1)

## 2016-08-18 LAB — URINE CULTURE: Special Requests: NORMAL

## 2016-08-18 LAB — AMYLASE: Amylase: 63 U/L (ref 28–100)

## 2016-08-18 LAB — CBC
HCT: 35.2 % — ABNORMAL LOW (ref 39.0–52.0)
Hemoglobin: 11.6 g/dL — ABNORMAL LOW (ref 13.0–17.0)
MCH: 28.8 pg (ref 26.0–34.0)
MCHC: 33 g/dL (ref 30.0–36.0)
MCV: 87.3 fL (ref 78.0–100.0)
PLATELETS: 298 10*3/uL (ref 150–400)
RBC: 4.03 MIL/uL — AB (ref 4.22–5.81)
RDW: 14.4 % (ref 11.5–15.5)
WBC: 9.6 10*3/uL (ref 4.0–10.5)

## 2016-08-18 LAB — GLUCOSE, CAPILLARY
GLUCOSE-CAPILLARY: 150 mg/dL — AB (ref 65–99)
Glucose-Capillary: 83 mg/dL (ref 65–99)
Glucose-Capillary: 76 mg/dL (ref 65–99)
Glucose-Capillary: 88 mg/dL (ref 65–99)

## 2016-08-18 MED ORDER — PANTOPRAZOLE SODIUM 40 MG PO TBEC
40.0000 mg | DELAYED_RELEASE_TABLET | Freq: Two times a day (BID) | ORAL | Status: DC
  Administered 2016-08-18 – 2016-08-19 (×3): 40 mg via ORAL
  Filled 2016-08-18 (×3): qty 1

## 2016-08-18 NOTE — Consult Note (Signed)
Reason for Consult: Cholelithiasis Referring Physician: Dr. Shannan Harper Dosher is an 66 y.o. male.  HPI: Patient is a 66 year old white male who was admitted to the hospital for fever, chills, and abdominal pain. He ultimately was found to have cholelithiasis, choledocholithiasis with cholangitis. He underwent ERCP with stone extraction by Dr. Laural Golden yesterday. He tolerated the procedure well. Today, his liver enzyme tests are improving and his jaundice is resolving. His leukocytosis is also resolving. He feels much better. He denies any significant abdominal pain, nausea, vomiting. He states this was his first episode of right upper quadrant abdominal pain. He is hungry.  Past Medical History:  Diagnosis Date  . Diabetes mellitus without complication ([place])   . GERD (gastroesophageal reflux disease)    occ-no meds  . Hyperlipemia   . Hypertension   . Shortness of breath     Past Surgical History:  Procedure Laterality Date  . COLON SURGERY  2011   gangrene lower colon with colostomy  . COLOSTOMY REVERSAL  2011  . OPEN REDUCTION INTERNAL FIXATION (ORIF) TIBIA/FIBULA FRACTURE Right 08/30/2012   Procedure: OPEN REDUCTION INTERNAL FIXATION (ORIF) TIBIA/FIBULA FRACTURE;  Surgeon: Wylene Simmer, MD;  Location: [facility];  Service: Orthopedics;  Laterality: Right;  . WOUND DEBRIDEMENT  2011   abd-    Family History  Problem Relation Age of Onset  . Heart attack Mother   . Heart attack Father     Social History:  reports that he quit smoking about 27 years ago. He has never used smokeless tobacco. He reports that he drinks alcohol. He reports that he does not use drugs.  Allergies: No Known Allergies  Medications:  Prior to Admission:  Prescriptions Prior to Admission  Medication Sig Dispense Refill Last Dose  . aspirin 81 MG chewable tablet Chew by mouth daily.   08/15/2016 at Unknown time  . Canagliflozin-Metformin HCl ER (INVOKAMET XR) [phone] MG TB24 Take 1  tablet by mouth daily with breakfast. 30 tablet 3 08/15/2016 at Unknown time  . fenofibrate (TRICOR) 145 MG tablet Take 1 tablet (145 mg total) by mouth daily. 30 tablet 3 08/15/2016 at Unknown time  . lisinopril (PRINIVIL,ZESTRIL) 10 MG tablet Take 1 tablet (10 mg total) by mouth daily. 30 tablet 3 08/15/2016 at Unknown time  . Omega-3 Fatty Acids (FISH OIL) 1000 MG CAPS Take 1 capsule (1,000 mg total) by mouth 2 (two) times daily. 120 capsule 3 08/15/2016 at Unknown time  . simvastatin (ZOCOR) 40 MG tablet Take 1 tablet (40 mg total) by mouth daily. 30 tablet 3 08/15/2016 at Unknown time  . traMADol (ULTRAM) 50 MG tablet Take 50 mg by mouth daily as needed for moderate pain.    08/15/2016 at Unknown time   Scheduled: . insulin aspart  0-15 Units Subcutaneous TID WC  . insulin aspart  0-5 Units Subcutaneous QHS    Results for orders placed or performed during the hospital encounter of 08/16/16 (from the past 48 hour(s))  MRSA PCR Screening     Status: None   Collection Time: 08/16/16  3:55 PM  Result Value Ref Range   MRSA by PCR NEGATIVE NEGATIVE    Comment:        The GeneXpert MRSA Assay (FDA approved for NASAL specimens only), is one component of a comprehensive MRSA colonization surveillance program. It is not intended to diagnose MRSA infection nor to guide or monitor treatment for MRSA infections.   Culture, blood (x 2)     Status: None (  Preliminary result)   Collection Time: 08/16/16  5:21 PM  Result Value Ref Range   Specimen Description LEFT ANTECUBITAL    Special Requests      BOTTLES DRAWN AEROBIC AND ANAEROBIC Blood Culture adequate volume   Culture NO GROWTH 2 DAYS    Report Status PENDING   Lactic acid, plasma     Status: None   Collection Time: 08/16/16  5:24 PM  Result Value Ref Range   Lactic Acid, Venous 1.0 0.5 - 1.9 mmol/L  Procalcitonin     Status: None   Collection Time: 08/16/16  5:26 PM  Result Value Ref Range   Procalcitonin 38.93 ng/mL    Comment:         Interpretation: PCT >= 10 ng/mL: Important systemic inflammatory response, almost exclusively due to severe bacterial sepsis or septic shock. (NOTE)         ICU PCT Algorithm               Non ICU PCT Algorithm    ----------------------------     ------------------------------         PCT < 0.25 ng/mL                 PCT < 0.1 ng/mL     Stopping of antibiotics            Stopping of antibiotics       strongly encouraged.               strongly encouraged.    ----------------------------     ------------------------------       PCT level decrease by               PCT < 0.25 ng/mL       >= 80% from peak PCT       OR PCT 0.25 - 0.5 ng/mL          Stopping of antibiotics                                             encouraged.     Stopping of antibiotics           encouraged.    ----------------------------     ------------------------------       PCT level decrease by              PCT >= 0.25 ng/mL       < 80% from peak PCT        AND PCT >= 0.5 ng/mL             Continuing antibiotics                                              encouraged.       Continuing antibiotics            encouraged.    ----------------------------     ------------------------------     PCT level increase compared          PCT > 0.5 ng/mL         with peak PCT AND          PCT >= 0.5 ng/mL  Escalation of antibiotics                                          strongly encouraged.      Escalation of antibiotics        strongly encouraged.   APTT     Status: None   Collection Time: 08/16/16  5:26 PM  Result Value Ref Range   aPTT 31 24 - 36 seconds  Hepatitis panel, acute     Status: None   Collection Time: 08/16/16  5:26 PM  Result Value Ref Range   Hepatitis B Surface Ag Negative Negative   HCV Ab <0.1 0.0 - 0.9 s/co ratio    Comment: (NOTE)                                  Negative:     < 0.8                             Indeterminate: 0.8 - 0.9                                  Positive:      > 0.9 The CDC recommends that a positive HCV antibody result be followed up with a HCV Nucleic Acid Amplification test ([id number]). Performed At: [facility] [place], [place] [id number] Lindon Romp MD NU:[id number]    Hep A IgM Negative Negative   Hep B C IgM Negative Negative  Protime-INR     Status: None   Collection Time: 08/16/16  5:26 PM  Result Value Ref Range   Prothrombin Time 15.1 11.4 - 15.2 seconds   INR 1.18   Culture, blood (x 2)     Status: None (Preliminary result)   Collection Time: 08/16/16  5:31 PM  Result Value Ref Range   Specimen Description RIGHT ANTECUBITAL    Special Requests      BOTTLES DRAWN AEROBIC AND ANAEROBIC Blood Culture adequate volume   Culture NO GROWTH 2 DAYS    Report Status PENDING   Acetaminophen level     Status: Abnormal   Collection Time: 08/16/16  5:33 PM  Result Value Ref Range   Acetaminophen (Tylenol), Serum <10 (L) 10 - 30 ug/mL    Comment:        THERAPEUTIC CONCENTRATIONS VARY SIGNIFICANTLY. A RANGE OF 10-30 ug/mL MAY BE AN EFFECTIVE CONCENTRATION FOR MANY PATIENTS. HOWEVER, SOME ARE BEST TREATED AT CONCENTRATIONS OUTSIDE THIS RANGE. ACETAMINOPHEN CONCENTRATIONS >150 ug/mL AT 4 HOURS AFTER INGESTION AND >50 ug/mL AT 12 HOURS AFTER INGESTION ARE OFTEN ASSOCIATED WITH TOXIC REACTIONS.   Glucose, capillary     Status: None   Collection Time: 08/16/16  6:36 PM  Result Value Ref Range   Glucose-Capillary 94 65 - 99 mg/dL  Lactic acid, plasma     Status: None   Collection Time: 08/16/16  7:39 PM  Result Value Ref Range   Lactic Acid, Venous 0.8 0.5 - 1.9 mmol/L  Glucose, capillary     Status: None   Collection Time: 08/16/16  9:38 PM  Result Value Ref Range   Glucose-Capillary 93 65 - 99 mg/dL  C difficile quick scan w PCR reflex  Status: None   Collection Time: 08/17/16  3:15 AM  Result Value Ref Range   C Diff antigen NEGATIVE NEGATIVE   C Diff toxin NEGATIVE NEGATIVE   C Diff  interpretation No C. difficile detected.   Comprehensive metabolic panel     Status: Abnormal   Collection Time: 08/17/16  4:10 AM  Result Value Ref Range   Sodium 139 135 - 145 mmol/L   Potassium 4.0 3.5 - 5.1 mmol/L   Chloride 107 101 - 111 mmol/L   CO2 22 22 - 32 mmol/L   Glucose, Bld 83 65 - 99 mg/dL   BUN 34 (H) 6 - 20 mg/dL   Creatinine, Ser 2.23 (H) 0.61 - 1.24 mg/dL    Comment: DELTA CHECK NOTED   Calcium 8.5 (L) 8.9 - 10.3 mg/dL   Total Protein 6.4 (L) 6.5 - 8.1 g/dL   Albumin 2.4 (L) 3.5 - 5.0 g/dL   AST 303 (H) 15 - 41 U/L   ALT 328 (H) 17 - 63 U/L   Alkaline Phosphatase 90 38 - 126 U/L   Total Bilirubin 7.1 (H) 0.3 - 1.2 mg/dL   GFR calc non Af Amer 29 (L) >60 mL/min   GFR calc Af Amer 34 (L) >60 mL/min    Comment: (NOTE) The eGFR has been calculated using the CKD EPI equation. This calculation has not been validated in all clinical situations. eGFR's persistently <60 mL/min signify possible Chronic Kidney Disease.    Anion gap 10 5 - 15  CBC     Status: Abnormal   Collection Time: 08/17/16  4:10 AM  Result Value Ref Range   WBC 18.7 (H) 4.0 - 10.5 K/uL   RBC 4.11 (L) 4.22 - 5.81 MIL/uL   Hemoglobin 12.0 (L) 13.0 - 17.0 g/dL   HCT 35.8 (L) 39.0 - 52.0 %   MCV 87.1 78.0 - 100.0 fL   MCH 29.2 26.0 - 34.0 pg   MCHC 33.5 30.0 - 36.0 g/dL   RDW 14.6 11.5 - 15.5 %   Platelets 262 150 - 400 K/uL  Glucose, capillary     Status: None   Collection Time: 08/17/16  7:32 AM  Result Value Ref Range   Glucose-Capillary 78 65 - 99 mg/dL  Glucose, capillary     Status: None   Collection Time: 08/17/16 11:24 AM  Result Value Ref Range   Glucose-Capillary 70 65 - 99 mg/dL  Glucose, capillary     Status: Abnormal   Collection Time: 08/17/16  2:07 PM  Result Value Ref Range   Glucose-Capillary 60 (L) 65 - 99 mg/dL  Glucose, capillary     Status: None   Collection Time: 08/17/16  2:57 PM  Result Value Ref Range   Glucose-Capillary 95 65 - 99 mg/dL  Glucose, capillary      Status: None   Collection Time: 08/17/16  4:05 PM  Result Value Ref Range   Glucose-Capillary 70 65 - 99 mg/dL  Glucose, capillary     Status: Abnormal   Collection Time: 08/17/16  4:19 PM  Result Value Ref Range   Glucose-Capillary 122 (H) 65 - 99 mg/dL  Glucose, capillary     Status: None   Collection Time: 08/17/16  9:13 PM  Result Value Ref Range   Glucose-Capillary 76 65 - 99 mg/dL  CBC     Status: Abnormal   Collection Time: 08/18/16  4:32 AM  Result Value Ref Range   WBC 9.6 4.0 - 10.5 K/uL   RBC  4.03 (L) 4.22 - 5.81 MIL/uL   Hemoglobin 11.6 (L) 13.0 - 17.0 g/dL   HCT 35.2 (L) 39.0 - 52.0 %   MCV 87.3 78.0 - 100.0 fL   MCH 28.8 26.0 - 34.0 pg   MCHC 33.0 30.0 - 36.0 g/dL   RDW 14.4 11.5 - 15.5 %   Platelets 298 150 - 400 K/uL  Comprehensive metabolic panel     Status: Abnormal   Collection Time: 08/18/16  4:32 AM  Result Value Ref Range   Sodium 139 135 - 145 mmol/L   Potassium 4.0 3.5 - 5.1 mmol/L   Chloride 109 101 - 111 mmol/L   CO2 22 22 - 32 mmol/L   Glucose, Bld 85 65 - 99 mg/dL   BUN 26 (H) 6 - 20 mg/dL   Creatinine, Ser 1.57 (H) 0.61 - 1.24 mg/dL   Calcium 8.5 (L) 8.9 - 10.3 mg/dL   Total Protein 6.4 (L) 6.5 - 8.1 g/dL   Albumin 2.3 (L) 3.5 - 5.0 g/dL   AST 121 (H) 15 - 41 U/L   ALT 216 (H) 17 - 63 U/L   Alkaline Phosphatase 114 38 - 126 U/L   Total Bilirubin 5.1 (H) 0.3 - 1.2 mg/dL   GFR calc non Af Amer 45 (L) >60 mL/min   GFR calc Af Amer 52 (L) >60 mL/min    Comment: (NOTE) The eGFR has been calculated using the CKD EPI equation. This calculation has not been validated in all clinical situations. eGFR's persistently <60 mL/min signify possible Chronic Kidney Disease.    Anion gap 8 5 - 15  Amylase     Status: None   Collection Time: 08/18/16  4:32 AM  Result Value Ref Range   Amylase 63 28 - 100 U/L  Glucose, capillary     Status: None   Collection Time: 08/18/16  7:52 AM  Result Value Ref Range   Glucose-Capillary 83 65 - 99 mg/dL    Ct  Abdomen Pelvis Wo Contrast  Result Date: 08/16/2016 CLINICAL DATA:  Abdominal pain, nausea and vomiting intermittently for weeks. Reported history of cholelithiasis. EXAM: CT ABDOMEN AND PELVIS WITHOUT CONTRAST TECHNIQUE: Multidetector CT imaging of the abdomen and pelvis was performed following the standard protocol without IV contrast. COMPARISON:  None. FINDINGS: Lower chest: Nonspecific patchy subpleural reticulation at both lung bases with mild traction bronchiolectasis. Left lower lobe 2 mm solid pulmonary nodule (series 4/image 16). Coronary atherosclerosis. Hepatobiliary: Mild diffuse hepatic steatosis. No liver mass. Questionable fine liver surface irregularity. Cholelithiasis. No gallbladder wall thickening, gallbladder distention or pericholecystic fluid. No biliary ductal dilatation. Pancreas: Heterogeneous fatty infiltration of the pancreas, with no pancreatic mass or duct dilation. Spleen: Normal size. No mass. Adrenals/Urinary Tract: Normal adrenals. No hydronephrosis. No renal stones. Exophytic 0.7 cm renal cortical lesion in the lateral upper left kidney, too small to characterize, for which no further follow-up is required. No additional contour deforming renal mass. Normal caliber ureters, with no ureteral stones. Normal bladder. Stomach/Bowel: Grossly normal stomach. There is a small periumbilical hernia containing a tiny portion of a mid small bowel loop (series 2/ image 64). Normal caliber small bowel. No small bowel wall thickening or pneumatosis. Normal appendix. Oral contrast reaches the distal rectum. Normal large bowel with no diverticulosis, large bowel wall thickening or pericolonic fat stranding. Vascular/Lymphatic: Atherosclerotic nonaneurysmal abdominal aorta. No pathologically enlarged lymph nodes in the abdomen or pelvis. Reproductive: Top-normal size prostate with nonspecific internal prostatic calcifications. Other: No pneumoperitoneum, ascites or focal fluid  collection.  Musculoskeletal: No aggressive appearing focal osseous lesions. Mild thoracolumbar spondylosis. IMPRESSION: 1. Small periumbilical hernia containing a tiny portion of a mid small bowel loop, with no evidence of bowel obstruction or ischemia. 2. Diffuse hepatic steatosis. Question fine liver surface irregularity, cannot exclude early cirrhosis. Consider hepatic elastography for further liver fibrosis risk stratification, as clinically warranted. 3. Nonspecific findings at the lung bases that may indicate interstitial lung disease. Outpatient high-resolution chest CT may be obtained as clinically warranted. 4. Additional findings include cholelithiasis, aortic atherosclerosis and coronary atherosclerosis. Electronically Signed   By: Ilona Sorrel M.D.   On: 08/16/2016 12:30   Mr Abdomen Mrcp Wo Contrast  Result Date: 08/17/2016 CLINICAL DATA:  Abdominal pain and elevated liver function tests. EXAM: MRI ABDOMEN WITHOUT CONTRAST  (INCLUDING MRCP) TECHNIQUE: Multiplanar multisequence MR imaging of the abdomen was performed. Heavily T2-weighted images of the biliary and pancreatic ducts were obtained, and three-dimensional MRCP images were rendered by post processing. COMPARISON:  08/16/2016 CT scan FINDINGS: Despite efforts by the technologist and patient, motion artifact is present on today's exam and could not be eliminated. This reduces exam sensitivity and specificity. Lower chest: Interstitial accentuation at the lung bases better shown on recent CT. Hepatobiliary: Mild diffuse hepatic steatosis. Morphologic findings in the liver supportive of early cirrhosis. Subtle geographic T2 hyperintensity posteriorly in the right hepatic lobe without a well-defined associated mass, some of this could be due to fibrosis. And some is probably due to relative fatty sparing. I do not see a discrete mass; IV contrast was not administered. Numerous small gallstones in the 2-4 mm range are present dependently within the  gallbladder. Normal caliber biliary tree on image 7/15 and image 1/6 there is a suggestion of a 4 mm filling defect in the distal common bile duct. , suspicious for choledocholithiasis. Images 48-49 of series 12 provide a supportive appearance. Likely small exophytic 7 mm cyst adjacent to the posterior aspect of the right hepatic lobe on image 41/4. Pancreas:  Unremarkable Spleen:  Unremarkable Adrenals/Urinary Tract: Stable 6 mm exophytic left kidney upper pole lesion with low T2 signal, probably a tiny complex cyst. Unchanged from CT. Otherwise unremarkable. Stomach/Bowel: Unremarkable Vascular/Lymphatic: Scattered small celiac and peripancreatic lymph nodes are not overtly pathologically enlarged by size criteria a portacaval node measures 1.2 cm in short axis. Aortoiliac atherosclerotic vascular disease. Other:  No supplemental non-categorized findings. Musculoskeletal: Probable small hemangioma in the L4 vertebral body on image 13/3. IMPRESSION: 1. Cholelithiasis along with a 4 mm distal CBD stone compatible with choledocholithiasis. No biliary dilatation. 2. Geographic hepatic steatosis. There is some mild sparing posteriorly in the right hepatic lobe. Difficult to completely exclude the possibility of low-level fibrosis posteriorly in the right hepatic lobe. No obvious hepatic mass on noncontrast imaging. 3. There is some mild early morphologic findings of cirrhosis in the liver as suggested on CT. 4. Interstitial accentuation in the lung bases. 5. Small likely complex cyst of the left kidney upper pole, about 6 mm in diameter. Most likely incidental. 6.  Aortic Atherosclerosis (ICD10-I70.0). Electronically Signed   By: Van Clines M.D.   On: 08/17/2016 08:01   Mr 3d Recon At Scanner  Result Date: 08/17/2016 CLINICAL DATA:  Abdominal pain and elevated liver function tests. EXAM: MRI ABDOMEN WITHOUT CONTRAST  (INCLUDING MRCP) TECHNIQUE: Multiplanar multisequence MR imaging of the abdomen was  performed. Heavily T2-weighted images of the biliary and pancreatic ducts were obtained, and three-dimensional MRCP images were rendered by post processing. COMPARISON:  08/16/2016  CT scan FINDINGS: Despite efforts by the technologist and patient, motion artifact is present on today's exam and could not be eliminated. This reduces exam sensitivity and specificity. Lower chest: Interstitial accentuation at the lung bases better shown on recent CT. Hepatobiliary: Mild diffuse hepatic steatosis. Morphologic findings in the liver supportive of early cirrhosis. Subtle geographic T2 hyperintensity posteriorly in the right hepatic lobe without a well-defined associated mass, some of this could be due to fibrosis. And some is probably due to relative fatty sparing. I do not see a discrete mass; IV contrast was not administered. Numerous small gallstones in the 2-4 mm range are present dependently within the gallbladder. Normal caliber biliary tree on image 7/15 and image 1/6 there is a suggestion of a 4 mm filling defect in the distal common bile duct. , suspicious for choledocholithiasis. Images 48-49 of series 12 provide a supportive appearance. Likely small exophytic 7 mm cyst adjacent to the posterior aspect of the right hepatic lobe on image 41/4. Pancreas:  Unremarkable Spleen:  Unremarkable Adrenals/Urinary Tract: Stable 6 mm exophytic left kidney upper pole lesion with low T2 signal, probably a tiny complex cyst. Unchanged from CT. Otherwise unremarkable. Stomach/Bowel: Unremarkable Vascular/Lymphatic: Scattered small celiac and peripancreatic lymph nodes are not overtly pathologically enlarged by size criteria a portacaval node measures 1.2 cm in short axis. Aortoiliac atherosclerotic vascular disease. Other:  No supplemental non-categorized findings. Musculoskeletal: Probable small hemangioma in the L4 vertebral body on image 13/3. IMPRESSION: 1. Cholelithiasis along with a 4 mm distal CBD stone compatible with  choledocholithiasis. No biliary dilatation. 2. Geographic hepatic steatosis. There is some mild sparing posteriorly in the right hepatic lobe. Difficult to completely exclude the possibility of low-level fibrosis posteriorly in the right hepatic lobe. No obvious hepatic mass on noncontrast imaging. 3. There is some mild early morphologic findings of cirrhosis in the liver as suggested on CT. 4. Interstitial accentuation in the lung bases. 5. Small likely complex cyst of the left kidney upper pole, about 6 mm in diameter. Most likely incidental. 6.  Aortic Atherosclerosis (ICD10-I70.0). Electronically Signed   By: Van Clines M.D.   On: 08/17/2016 08:01   Dg Chest Port 1 View  Result Date: 08/16/2016 CLINICAL DATA:  Shortness of breath and abdominal pain EXAM: PORTABLE CHEST 1 VIEW COMPARISON:  05/19/2009 FINDINGS: Cardiac shadow is at the upper limits of normal in size accentuated by the portable technique. Lungs are clear bilaterally. No acute bony abnormalities is noted. IMPRESSION: No active disease. Electronically Signed   By: Inez Catalina M.D.   On: 08/16/2016 14:14   Dg Ercp Biliary & Pancreatic Ducts  Result Date: 08/17/2016 CLINICAL DATA:  65 year old male with choledocholithiasis EXAM: ERCP TECHNIQUE: Multiple spot images obtained with the fluoroscopic device and submitted for interpretation post-procedure. FLUOROSCOPY TIME:  Fluoroscopy Time:  0 minutes 39 seconds reported COMPARISON:  MRCP 08/16/2016 FINDINGS: Two cine clips and a saved image demonstrate a flexible endoscope in the descending duodenum with wire cannulation of the common bile duct and cholangiogram. There is a small mobile filling defect in the distal common bile duct consistent with choledocholithiasis. No significant biliary ductal dilatation. The images then demonstrate sphincterotomy and basket sweep of the common duct. IMPRESSION: ERCP with sphincterotomy and basket sweep of the common duct. These images were submitted for  radiologic interpretation only. Please see the procedural report for the amount of contrast and the fluoroscopy time utilized. Electronically Signed   By: Jacqulynn Cadet M.D.   On: 08/17/2016 16:52  ROS:  Pertinent items noted in HPI and remainder of comprehensive ROS otherwise negative.  Blood pressure 123/64, pulse 60, temperature 97.5 F (36.4 C), temperature source Oral, resp. rate 16, height _0  (1.803 m), weight 210 lb 15.7 oz (95.7 kg), SpO2 96 %. Physical Exam: Well-developed well-nourished white male in no acute distress. Head is normocephalic, atraumatic. Eye examination reveals scleral icterus. Neck is supple without lymphadenopathy. Lungs clear auscultation with equal breath sounds bilaterally. Heart examination reveals a regular rate and rhythm without S3, S4, murmurs. Abdomen is soft with some distention noted. Bowel sounds are present. No rigidity is noted. Multiple surgical scars are present.  H&P reviewed. ERCP note reviewed.  Assessment/Plan: Impression: Cholelithiasis, choledocholithiasis, cholangitis, status post ERCP with stone extraction. Plan: Patient ultimately will need a cholecystectomy. Unfortunately, he has had multiple abdominal surgeries in the past which preclude laparoscopic approach. Thus, he will need an open cholecystectomy. I would like to see his liver enzyme tests normalize prior to surgical intervention. We'll reassess in the morning as to the planning of his cholecystectomy. Patient's diet may be advanced.  Aviva Signs 08/18/2016, 11:01 AM

## 2016-08-18 NOTE — Addendum Note (Signed)
Addendum  created 08/18/16 1043 by Earleen NewportAdams, Chrissie Dacquisto A, CRNA   Sign clinical note

## 2016-08-18 NOTE — Progress Notes (Addendum)
Patient ID: Candis SchatzWilliam Vandervort, male   DOB: Aug 17, 1950, 66 y.o.   MRN: [id number] States he feels better. Transaminases are coming down. Denies any pain. Requesting a diet. Underwent an ERCP yesterday with removal of CBD stone. Blood pressure 110/67, pulse 63, temperature 97.5 F (36.4 C), temperature source Oral, resp. rate 19, height 5\' 11"  (1.803 m), weight 210 lb 15.7 oz (95.7 kg), SpO2 96 %. Assessmetnt/Plan. CBD Stone. ERCP yesterday. May advance diet to low fat   GI attending note:  Patient tolerated lunch without nausea vomiting or abdominal pain. As above significant drop in bilirubin AP and transaminases. H. pylori serology is pending. WBC is down to normal. Hemoglobin is 11.6. Mild anemia secondary to acute illness. Blood cultures remained negative. From GI standpoint patient should be able to go home tomorrow on by mouth antibiotic. Cholecystectomy per Dr. Lovell SheehanJenkins.

## 2016-08-18 NOTE — Progress Notes (Addendum)
PROGRESS NOTE                                                                                                                                                                                                             Patient Demographics:    Javier Walker, is a 66 y.o. male, DOB - [birth year], ZOX:[id number]  Admit date - 08/16/2016   Admitting Physician Erick Blinks, MD  Outpatient Primary MD for the patient is [facility]  LOS - 2   Chief Complaint  Patient presents with  . Dizziness       Brief Narrative   66 y.o. male with medical history significant of hypertension, diabetes, hyperlipidemia, presents to the ER with complaints of abdominal pain for the past 10 days, workup significant for elevated LFTs secondary to cholangitis, Status post ERCP 08/17/2016 with stone extraction.   Subjective:    Javier Walker today has, No headache, No chest pain,Still with some diarrhea, but has significantly improved, no further abdominal pain, afebrile, tolerating clear liquid diet, no nausea or vomiting .    Assessment  & Plan :    Active Problems:   Uncontrolled type 2 diabetes mellitus with complication, without long-term current use of insulin (HCC)   Essential hypertension, benign   Hypercholesteremia   Sepsis (HCC)   AKI (acute kidney injury) (HCC)   Dehydration   Elevated LFTs   Jaundice   UTI (urinary tract infection)   Calculus of gallbladder and bile duct with obstruction without cholecystitis  Elevated LFTs secondary to cholangitis - Patient presents with right upper quadrant pain, elevated LFTs, with hyperbilirubinemia, white count of 20 8.8K, MRCP significant for cholelithiasis/choledocholithiasis with 4 mm stone in  distal common bile duct. - GI consult greatly appreciated,Went for ERCP 5/9, with stone extraction. - LFTs trending down, continue to monitor closely, patient is afebrile, white blood cell has  normalized, blood cultures remain with no growth to date, continue with IV Zosyn. - No surgery input greatly appreciated, timing of surgery to be determined pending on clinical course.  Acute renal failure - This is most likely related to volume depletion, dehydration, probably ATN from infectious process. - continue  to improve, creatinine is 1.57 today, will decrease IV normal saline 75 mL/h  UTI - Urine white blood cells too numerous to count, urine cultures not helpful  as growing Multiple species, he is already on Zosyn   Duodenal ulcer - Evidence of Bulbar  ulcer on ERCP, will start on PPI, avoid NSAIDs and aspirin, no coagulation for 3 days.  Diarrhea - C. difficile negative  Diabetes mellitus -  continue to hold oral hypoglycemic agents, CBGs are acceptable, continue with insulin sliding scale.  Hyperlipidemia - Continue to hold statin secondary to elevated liver enzymes  Hypertension - Blood pressure acceptable off medication, initially soft in presentation, but improving with IV fluids, continue to hold lisinopril especially in the setting of renal failure .    Code Status : Full  Family Communication  : Wife at bedside  Disposition Plan  : Home when stable  Consults  :  GI,  gen surgery  Procedures  : ERCP 08/17/2016  DVT Prophylaxis  :  SCDs , and encourage to ambulate, no chemical prophylaxis given bulbar ulcer  Lab Results  Component Value Date   PLT 298 08/18/2016    Antibiotics  :    Anti-infectives    Start     Dose/Rate Route Frequency Ordered Stop   08/16/16 1730  piperacillin-tazobactam (ZOSYN) IVPB 3.375 g     3.375 g 12.5 mL/hr over 240 Minutes Intravenous Every 8 hours 08/16/16 1719     08/16/16 1715  piperacillin-tazobactam (ZOSYN) IVPB 3.375 g  Status:  Discontinued     3.375 g 100 mL/hr over 30 Minutes Intravenous  Once 08/16/16 1714 08/16/16 1718   08/16/16 1345  cefTRIAXone (ROCEPHIN) 1 g in dextrose 5 % 50 mL IVPB     1 g 100 mL/hr  over 30 Minutes Intravenous  Once 08/16/16 1335 08/16/16 1518        Objective:   Vitals:   08/18/16 0900 08/18/16 1000 08/18/16 1100 08/18/16 1200  BP: 115/67 123/64 128/78   Pulse: (!) 59 60 62 75  Resp: 15 16 (!) 21 19  Temp:    97.9 F (36.6 C)  TempSrc:    Oral  SpO2: 97% 96% 95% 95%  Weight:      Height:        Wt Readings from Last 3 Encounters:  08/17/16 95.7 kg (210 lb 15.7 oz)  05/05/16 97.5 kg (215 lb)  03/16/16 99.8 kg (220 lb)     Intake/Output Summary (Last 24 hours) at 08/18/16 1303 Last data filed at 08/18/16 1000  Gross per 24 hour  Intake          2761.25 ml  Output             1350 ml  Net          1411.25 ml     Physical Exam  Awake alert 3, less jaundiced today  No JVD Moderate bilaterally, no wheezing rales or rhonchi, clear to auscultation RRR,No Gallops,Rubs or new Murmurs, No Parasternal Heave +ve B.Sounds, Abd Soft, No further tenderness, No rebound - guarding or rigidity. No Cyanosis, Clubbing , No new Rash or bruise  , no leg edema    Data Review:    CBC  Recent Labs Lab 08/16/16 0857 08/17/16 0410 08/18/16 0432  WBC 28.8* 18.7* 9.6  HGB 12.4* 12.0* 11.6*  HCT 37.2* 35.8* 35.2*  PLT 267 262 298  MCV 87.1 87.1 87.3  MCH 29.0 29.2 28.8  MCHC 33.3 33.5 33.0  RDW 14.1 14.6 14.4  LYMPHSABS 1.9  --   --   MONOABS 1.1*  --   --   EOSABS 0.0  --   --  BASOSABS 0.0  --   --     Chemistries   Recent Labs Lab 08/16/16 0857 08/17/16 0410 08/18/16 0432  NA 137 139 139  K 4.2 4.0 4.0  CL 102 107 109  CO2 25 22 22   GLUCOSE 173* 83 85  BUN 38* 34* 26*  CREATININE 3.62* 2.23* 1.57*  CALCIUM 8.5* 8.5* 8.5*  AST 465* 303* 121*  ALT 334* 328* 216*  ALKPHOS 94 90 114  BILITOT 8.6* 7.1* 5.1*   ------------------------------------------------------------------------------------------------------------------ No results for input(s): CHOL, HDL, LDLCALC, TRIG, CHOLHDL, LDLDIRECT in the last 72 hours.  Lab Results    Component Value Date   HGBA1C 6.7 (H) 04/26/2016   ------------------------------------------------------------------------------------------------------------------ No results for input(s): TSH, T4TOTAL, T3FREE, THYROIDAB in the last 72 hours.  Invalid input(s): FREET3 ------------------------------------------------------------------------------------------------------------------ No results for input(s): VITAMINB12, FOLATE, FERRITIN, TIBC, IRON, RETICCTPCT in the last 72 hours.  Coagulation profile  Recent Labs Lab 08/16/16 1726  INR 1.18    No results for input(s): DDIMER in the last 72 hours.  Cardiac Enzymes  Recent Labs Lab 08/16/16 0857  TROPONINI <0.03   ------------------------------------------------------------------------------------------------------------------ No results found for: BNP  Inpatient Medications  Scheduled Meds: . insulin aspart  0-15 Units Subcutaneous TID WC  . insulin aspart  0-5 Units Subcutaneous QHS   Continuous Infusions: . sodium chloride 1,000 mL (08/18/16 1202)  . piperacillin-tazobactam (ZOSYN)  IV Stopped (08/18/16 1345)   PRN Meds:.acetaminophen **OR** acetaminophen, ondansetron **OR** ondansetron (ZOFRAN) IV  Micro Results Recent Results (from the past 240 hour(s))  Urine culture     Status: Abnormal   Collection Time: 08/16/16  8:13 AM  Result Value Ref Range Status   Specimen Description URINE, CLEAN CATCH  Final   Special Requests Normal  Final   Culture MULTIPLE SPECIES PRESENT, SUGGEST RECOLLECTION (A)  Final   Report Status 08/18/2016 FINAL  Final  MRSA PCR Screening     Status: None   Collection Time: 08/16/16  3:55 PM  Result Value Ref Range Status   MRSA by PCR NEGATIVE NEGATIVE Final    Comment:        The GeneXpert MRSA Assay (FDA approved for NASAL specimens only), is one component of a comprehensive MRSA colonization surveillance program. It is not intended to diagnose MRSA infection nor to guide  or monitor treatment for MRSA infections.   Culture, blood (x 2)     Status: None (Preliminary result)   Collection Time: 08/16/16  5:21 PM  Result Value Ref Range Status   Specimen Description LEFT ANTECUBITAL  Final   Special Requests   Final    BOTTLES DRAWN AEROBIC AND ANAEROBIC Blood Culture adequate volume   Culture NO GROWTH 2 DAYS  Final   Report Status PENDING  Incomplete  Culture, blood (x 2)     Status: None (Preliminary result)   Collection Time: 08/16/16  5:31 PM  Result Value Ref Range Status   Specimen Description RIGHT ANTECUBITAL  Final   Special Requests   Final    BOTTLES DRAWN AEROBIC AND ANAEROBIC Blood Culture adequate volume   Culture NO GROWTH 2 DAYS  Final   Report Status PENDING  Incomplete  C difficile quick scan w PCR reflex     Status: None   Collection Time: 08/17/16  3:15 AM  Result Value Ref Range Status   C Diff antigen NEGATIVE NEGATIVE Final   C Diff toxin NEGATIVE NEGATIVE Final   C Diff interpretation No C. difficile detected.  Final  Radiology Reports Ct Abdomen Pelvis Wo Contrast  Result Date: 08/16/2016 CLINICAL DATA:  Abdominal pain, nausea and vomiting intermittently for weeks. Reported history of cholelithiasis. EXAM: CT ABDOMEN AND PELVIS WITHOUT CONTRAST TECHNIQUE: Multidetector CT imaging of the abdomen and pelvis was performed following the standard protocol without IV contrast. COMPARISON:  None. FINDINGS: Lower chest: Nonspecific patchy subpleural reticulation at both lung bases with mild traction bronchiolectasis. Left lower lobe 2 mm solid pulmonary nodule (series 4/image 16). Coronary atherosclerosis. Hepatobiliary: Mild diffuse hepatic steatosis. No liver mass. Questionable fine liver surface irregularity. Cholelithiasis. No gallbladder wall thickening, gallbladder distention or pericholecystic fluid. No biliary ductal dilatation. Pancreas: Heterogeneous fatty infiltration of the pancreas, with no pancreatic mass or duct dilation.  Spleen: Normal size. No mass. Adrenals/Urinary Tract: Normal adrenals. No hydronephrosis. No renal stones. Exophytic 0.7 cm renal cortical lesion in the lateral upper left kidney, too small to characterize, for which no further follow-up is required. No additional contour deforming renal mass. Normal caliber ureters, with no ureteral stones. Normal bladder. Stomach/Bowel: Grossly normal stomach. There is a small periumbilical hernia containing a tiny portion of a mid small bowel loop (series 2/ image 64). Normal caliber small bowel. No small bowel wall thickening or pneumatosis. Normal appendix. Oral contrast reaches the distal rectum. Normal large bowel with no diverticulosis, large bowel wall thickening or pericolonic fat stranding. Vascular/Lymphatic: Atherosclerotic nonaneurysmal abdominal aorta. No pathologically enlarged lymph nodes in the abdomen or pelvis. Reproductive: Top-normal size prostate with nonspecific internal prostatic calcifications. Other: No pneumoperitoneum, ascites or focal fluid collection. Musculoskeletal: No aggressive appearing focal osseous lesions. Mild thoracolumbar spondylosis. IMPRESSION: 1. Small periumbilical hernia containing a tiny portion of a mid small bowel loop, with no evidence of bowel obstruction or ischemia. 2. Diffuse hepatic steatosis. Question fine liver surface irregularity, cannot exclude early cirrhosis. Consider hepatic elastography for further liver fibrosis risk stratification, as clinically warranted. 3. Nonspecific findings at the lung bases that may indicate interstitial lung disease. Outpatient high-resolution chest CT may be obtained as clinically warranted. 4. Additional findings include cholelithiasis, aortic atherosclerosis and coronary atherosclerosis. Electronically Signed   By: Delbert PhenixJason A Poff M.D.   On: 08/16/2016 12:30   Mr Abdomen Mrcp Wo Contrast  Result Date: 08/17/2016 CLINICAL DATA:  Abdominal pain and elevated liver function tests. EXAM: MRI  ABDOMEN WITHOUT CONTRAST  (INCLUDING MRCP) TECHNIQUE: Multiplanar multisequence MR imaging of the abdomen was performed. Heavily T2-weighted images of the biliary and pancreatic ducts were obtained, and three-dimensional MRCP images were rendered by post processing. COMPARISON:  08/16/2016 CT scan FINDINGS: Despite efforts by the technologist and patient, motion artifact is present on today's exam and could not be eliminated. This reduces exam sensitivity and specificity. Lower chest: Interstitial accentuation at the lung bases better shown on recent CT. Hepatobiliary: Mild diffuse hepatic steatosis. Morphologic findings in the liver supportive of early cirrhosis. Subtle geographic T2 hyperintensity posteriorly in the right hepatic lobe without a well-defined associated mass, some of this could be due to fibrosis. And some is probably due to relative fatty sparing. I do not see a discrete mass; IV contrast was not administered. Numerous small gallstones in the 2-4 mm range are present dependently within the gallbladder. Normal caliber biliary tree on image 7/15 and image 1/6 there is a suggestion of a 4 mm filling defect in the distal common bile duct. , suspicious for choledocholithiasis. Images 48-49 of series 12 provide a supportive appearance. Likely small exophytic 7 mm cyst adjacent to the posterior aspect of the right hepatic  lobe on image 41/4. Pancreas:  Unremarkable Spleen:  Unremarkable Adrenals/Urinary Tract: Stable 6 mm exophytic left kidney upper pole lesion with low T2 signal, probably a tiny complex cyst. Unchanged from CT. Otherwise unremarkable. Stomach/Bowel: Unremarkable Vascular/Lymphatic: Scattered small celiac and peripancreatic lymph nodes are not overtly pathologically enlarged by size criteria a portacaval node measures 1.2 cm in short axis. Aortoiliac atherosclerotic vascular disease. Other:  No supplemental non-categorized findings. Musculoskeletal: Probable small hemangioma in the L4  vertebral body on image 13/3. IMPRESSION: 1. Cholelithiasis along with a 4 mm distal CBD stone compatible with choledocholithiasis. No biliary dilatation. 2. Geographic hepatic steatosis. There is some mild sparing posteriorly in the right hepatic lobe. Difficult to completely exclude the possibility of low-level fibrosis posteriorly in the right hepatic lobe. No obvious hepatic mass on noncontrast imaging. 3. There is some mild early morphologic findings of cirrhosis in the liver as suggested on CT. 4. Interstitial accentuation in the lung bases. 5. Small likely complex cyst of the left kidney upper pole, about 6 mm in diameter. Most likely incidental. 6.  Aortic Atherosclerosis (ICD10-I70.0). Electronically Signed   By: Gaylyn Rong M.D.   On: 08/17/2016 08:01   Mr 3d Recon At Scanner  Result Date: 08/17/2016 CLINICAL DATA:  Abdominal pain and elevated liver function tests. EXAM: MRI ABDOMEN WITHOUT CONTRAST  (INCLUDING MRCP) TECHNIQUE: Multiplanar multisequence MR imaging of the abdomen was performed. Heavily T2-weighted images of the biliary and pancreatic ducts were obtained, and three-dimensional MRCP images were rendered by post processing. COMPARISON:  08/16/2016 CT scan FINDINGS: Despite efforts by the technologist and patient, motion artifact is present on today's exam and could not be eliminated. This reduces exam sensitivity and specificity. Lower chest: Interstitial accentuation at the lung bases better shown on recent CT. Hepatobiliary: Mild diffuse hepatic steatosis. Morphologic findings in the liver supportive of early cirrhosis. Subtle geographic T2 hyperintensity posteriorly in the right hepatic lobe without a well-defined associated mass, some of this could be due to fibrosis. And some is probably due to relative fatty sparing. I do not see a discrete mass; IV contrast was not administered. Numerous small gallstones in the 2-4 mm range are present dependently within the gallbladder.  Normal caliber biliary tree on image 7/15 and image 1/6 there is a suggestion of a 4 mm filling defect in the distal common bile duct. , suspicious for choledocholithiasis. Images 48-49 of series 12 provide a supportive appearance. Likely small exophytic 7 mm cyst adjacent to the posterior aspect of the right hepatic lobe on image 41/4. Pancreas:  Unremarkable Spleen:  Unremarkable Adrenals/Urinary Tract: Stable 6 mm exophytic left kidney upper pole lesion with low T2 signal, probably a tiny complex cyst. Unchanged from CT. Otherwise unremarkable. Stomach/Bowel: Unremarkable Vascular/Lymphatic: Scattered small celiac and peripancreatic lymph nodes are not overtly pathologically enlarged by size criteria a portacaval node measures 1.2 cm in short axis. Aortoiliac atherosclerotic vascular disease. Other:  No supplemental non-categorized findings. Musculoskeletal: Probable small hemangioma in the L4 vertebral body on image 13/3. IMPRESSION: 1. Cholelithiasis along with a 4 mm distal CBD stone compatible with choledocholithiasis. No biliary dilatation. 2. Geographic hepatic steatosis. There is some mild sparing posteriorly in the right hepatic lobe. Difficult to completely exclude the possibility of low-level fibrosis posteriorly in the right hepatic lobe. No obvious hepatic mass on noncontrast imaging. 3. There is some mild early morphologic findings of cirrhosis in the liver as suggested on CT. 4. Interstitial accentuation in the lung bases. 5. Small likely complex cyst of the  left kidney upper pole, about 6 mm in diameter. Most likely incidental. 6.  Aortic Atherosclerosis (ICD10-I70.0). Electronically Signed   By: Gaylyn Rong M.D.   On: 08/17/2016 08:01   Dg Chest Port 1 View  Result Date: 08/16/2016 CLINICAL DATA:  Shortness of breath and abdominal pain EXAM: PORTABLE CHEST 1 VIEW COMPARISON:  05/19/2009 FINDINGS: Cardiac shadow is at the upper limits of normal in size accentuated by the portable  technique. Lungs are clear bilaterally. No acute bony abnormalities is noted. IMPRESSION: No active disease. Electronically Signed   By: Alcide Clever M.D.   On: 08/16/2016 14:14   Dg Ercp Biliary & Pancreatic Ducts  Result Date: 08/17/2016 CLINICAL DATA:  66 year old male with choledocholithiasis EXAM: ERCP TECHNIQUE: Multiple spot images obtained with the fluoroscopic device and submitted for interpretation post-procedure. FLUOROSCOPY TIME:  Fluoroscopy Time:  0 minutes 39 seconds reported COMPARISON:  MRCP 08/16/2016 FINDINGS: Two cine clips and a saved image demonstrate a flexible endoscope in the descending duodenum with wire cannulation of the common bile duct and cholangiogram. There is a small mobile filling defect in the distal common bile duct consistent with choledocholithiasis. No significant biliary ductal dilatation. The images then demonstrate sphincterotomy and basket sweep of the common duct. IMPRESSION: ERCP with sphincterotomy and basket sweep of the common duct. These images were submitted for radiologic interpretation only. Please see the procedural report for the amount of contrast and the fluoroscopy time utilized. Electronically Signed   By: Malachy Moan M.D.   On: 08/17/2016 16:52     Jabar Krysiak M.D on 08/18/2016 at 1:03 PM  Between 7am to 7pm - Pager - [phone]  After 7pm go to [email or web address] - password [email or web address]  [facility] -  Office  [phone]

## 2016-08-18 NOTE — Anesthesia Postprocedure Evaluation (Signed)
Anesthesia Post Note  Patient: Javier SchatzWilliam Walker  Procedure(s) Performed: Procedure(s) (LRB): ENDOSCOPIC RETROGRADE CHOLANGIOPANCREATOGRAPHY (ERCP) Biliary sphincterotomy and stone extraction (N/A)  Patient location during evaluation: Nursing Unit Anesthesia Type: General Level of consciousness: awake and alert and oriented Pain management: pain level controlled Vital Signs Assessment: post-procedure vital signs reviewed and stable Respiratory status: spontaneous breathing Cardiovascular status: stable Postop Assessment: no signs of nausea or vomiting Anesthetic complications: no     Last Vitals:  Vitals:   08/18/16 0900 08/18/16 1000  BP: 115/67 123/64  Pulse: (!) 59 60  Resp: 15 16  Temp:      Last Pain:  Vitals:   08/18/16 0800  TempSrc: Oral  PainSc:                  ADAMS, AMY A

## 2016-08-18 NOTE — Progress Notes (Signed)
Walked around the hall down to glass window. Tolerated well with no difficulty.

## 2016-08-19 DIAGNOSIS — K8309 Other cholangitis: Secondary | ICD-10-CM

## 2016-08-19 LAB — CBC
HEMATOCRIT: 37.1 % — AB (ref 39.0–52.0)
Hemoglobin: 12.1 g/dL — ABNORMAL LOW (ref 13.0–17.0)
MCH: 28.7 pg (ref 26.0–34.0)
MCHC: 32.6 g/dL (ref 30.0–36.0)
MCV: 87.9 fL (ref 78.0–100.0)
PLATELETS: 318 10*3/uL (ref 150–400)
RBC: 4.22 MIL/uL (ref 4.22–5.81)
RDW: 14.7 % (ref 11.5–15.5)
WBC: 8.9 10*3/uL (ref 4.0–10.5)

## 2016-08-19 LAB — GLUCOSE, CAPILLARY
GLUCOSE-CAPILLARY: 102 mg/dL — AB (ref 65–99)
Glucose-Capillary: 91 mg/dL (ref 65–99)

## 2016-08-19 LAB — COMPREHENSIVE METABOLIC PANEL
ALT: 145 U/L — AB (ref 17–63)
AST: 56 U/L — AB (ref 15–41)
Albumin: 2.5 g/dL — ABNORMAL LOW (ref 3.5–5.0)
Alkaline Phosphatase: 81 U/L (ref 38–126)
Anion gap: 7 (ref 5–15)
BILIRUBIN TOTAL: 2.7 mg/dL — AB (ref 0.3–1.2)
BUN: 24 mg/dL — AB (ref 6–20)
CALCIUM: 8.7 mg/dL — AB (ref 8.9–10.3)
CO2: 25 mmol/L (ref 22–32)
CREATININE: 1.57 mg/dL — AB (ref 0.61–1.24)
Chloride: 108 mmol/L (ref 101–111)
GFR, EST AFRICAN AMERICAN: 52 mL/min — AB (ref >60)
GFR, EST NON AFRICAN AMERICAN: 45 mL/min — AB (ref >60)
Glucose, Bld: 107 mg/dL — ABNORMAL HIGH (ref 65–99)
Potassium: 4.1 mmol/L (ref 3.5–5.1)
Sodium: 140 mmol/L (ref 135–145)
TOTAL PROTEIN: 6.6 g/dL (ref 6.5–8.1)

## 2016-08-19 LAB — H. PYLORI ANTIBODY, IGG: H Pylori IgG: 1.04 Index Value — ABNORMAL HIGH (ref 0.00–0.79)

## 2016-08-19 MED ORDER — ASPIRIN 81 MG PO CHEW: 81.0000 mg | CHEWABLE_TABLET | Freq: Every day | ORAL | Status: DC

## 2016-08-19 MED ORDER — PANTOPRAZOLE SODIUM 40 MG PO TBEC: 40.0000 mg | DELAYED_RELEASE_TABLET | Freq: Two times a day (BID) | ORAL | 0 refills | Status: DC

## 2016-08-19 MED ORDER — SACCHAROMYCES BOULARDII 250 MG PO CAPS: 250.0000 mg | ORAL_CAPSULE | Freq: Two times a day (BID) | ORAL | 0 refills | Status: DC

## 2016-08-19 MED ORDER — AMOXICILLIN-POT CLAVULANATE 875-125 MG PO TABS: 1.0000 | ORAL_TABLET | Freq: Two times a day (BID) | ORAL | 0 refills | Status: DC

## 2016-08-19 NOTE — Discharge Instructions (Signed)
Day Surgery appointment at 8am, Tuesday, 5/15   Open Cholecystectomy Open cholecystectomy is surgery to remove the gallbladder. The gallbladder is a pear-shaped organ that lies beneath the liver on the right side of the body. The gallbladder stores bile, which is a fluid that helps the body to digest fats. Cholecystectomy is often done for inflammation of the gallbladder (cholecystitis). This condition is usually caused by a buildup of gallstones (cholelithiasis) in the gallbladder. Gallstones can block the flow of bile, which can result in inflammation and pain. In severe cases, emergency surgery may be required. Tell a health care provider about:  Any allergies you have.  All medicines you are taking, including vitamins, herbs, eye drops, creams, and over-the-counter medicines.  Any problems you or family members have had with anesthetic medicines.  Any blood disorders you have.  Any surgeries you have had.  Any medical conditions you have.  Whether you are pregnant or may be pregnant. What are the risks? Generally, this is a safe procedure. However, problems may occur, including:  Infection.  Bleeding.  Allergic reactions to medicines.  Damage to other structures or organs.  A stone remaining in the common bile duct. The common bile duct carries bile from the gallbladder into the small intestine.  A bile leak from the cyst duct that is clipped when your gallbladder is removed. What happens before the procedure? Staying hydrated  Follow instructions from your health care provider about hydration, which may include:  Up to 2 hours before the procedure - you may continue to drink clear liquids, such as water, clear fruit juice, black coffee, and plain tea. Eating and drinking restrictions  Follow instructions from your health care provider about eating and drinking, which may include:  8 hours before the procedure - stop eating heavy meals or foods such as meat, fried foods,  or fatty foods.  6 hours before the procedure - stop eating light meals or foods, such as toast or cereal.  6 hours before the procedure - stop drinking milk or drinks that contain milk.  2 hours before the procedure - stop drinking clear liquids. Medicines   Ask your health care provider about:  Changing or stopping your regular medicines. This is especially important if you are taking diabetes medicines or blood thinners.  Taking medicines such as aspirin and ibuprofen. These medicines can thin your blood. Do not take these medicines before your procedure if your health care provider instructs you not to.  You may be given antibiotic medicine to help prevent infection. General instructions   Let your health care provider know if you develop a cold or an infection before surgery.  Plan to have someone take you home from the hospital or clinic.  Ask your health care provider how your surgical site will be marked or identified. What happens during the procedure?  To reduce your risk of infection:  Your health care team will wash or sanitize their hands.  Your skin will be washed with soap.  Hair may be removed from the surgical area.  An IV tube may be inserted into one of your veins.  You will be given one or more of the following:  A medicine to help you relax (sedative).  A medicine to make you fall asleep (general anesthetic).  A breathing tube will be placed in your mouth.  Your surgeon will make a cut (incision) in the upper abdomen to access your gallbladder.  Your gallbladder will be removed.  Your common bile  duct may be examined. If stones are found in the common bile duct, they may be removed.  After your gallbladder has been removed, the incisions will be closed with stitches (sutures), skin glue, or staples.  Your incision will be covered with a bandage (dressing). The procedure may vary among health care providers and hospitals. What happens after the  procedure?  Your blood pressure, heart rate, breathing rate, and blood oxygen level will be monitored until the medicines you were given have worn off.  You will be given medicines as needed to control your pain.  Do not drive for 24 hours if you were given a sedative. This information is not intended to replace advice given to you by your health care provider. Make sure you discuss any questions you have with your health care provider. Document Released: 12/18/2001 Document Revised: 10/17/2015 Document Reviewed: 09/14/2015 Elsevier Interactive Patient Education  2017 [employer].

## 2016-08-19 NOTE — Progress Notes (Signed)
Pharmacy Antibiotic Note  Javier SchatzWilliam Walker is a 66 y.o. male admitted on 08/16/2016 with sepsis and IAI.  Pharmacy has been consulted for ZOSYN dosing. Cultures are negative to date.  Afeb and WBC normal.  s/p ERCP 5/9 with stone extraction  Plan: Zosyn 3.375gm IV q8h, EID Monitor labs, progress, c/s  Height: 5\' 11"  (180.3 cm) Weight: 208 lb 8.9 oz (94.6 kg) IBW/kg (Calculated) : 75.3  Temp (24hrs), Avg:97.9 F (36.6 C), Min:97.4 F (36.3 C), Max:98.4 F (36.9 C)   Recent Labs Lab 08/16/16 0857 08/16/16 1724 08/16/16 1939 08/17/16 0410 08/18/16 0432 08/19/16 0433  WBC 28.8*  --   --  18.7* 9.6 8.9  CREATININE 3.62*  --   --  2.23* 1.57* 1.57*  LATICACIDVEN  --  1.0 0.8  --   --   --     Estimated Creatinine Clearance: 55.1 mL/min (A) (by C-G formula based on SCr of 1.57 mg/dL (H)).    No Known Allergies  Antimicrobials this admission: Zosyn 5/8 >>   Dose adjustments this admission:  Microbiology results:  BCx: pending  UCx: multiple species  Sputum:    MRSA PCR:   Thank you for allowing pharmacy to be a part of this patient's care.  Woodfin GanjaSeay, Javier Walker 08/19/2016 8:38 AM

## 2016-08-19 NOTE — Progress Notes (Signed)
2 Days Post-Op  Subjective: Patient feels fine. No abdominal pain. No fever or chills. Tolerating diet well.  Objective: Vital signs in last 24 hours: Temp:  [97.4 F (36.3 C)-98.4 F (36.9 C)] 98.2 F (36.8 C) (05/11 0700) Pulse Rate:  [57-77] 61 (05/11 0600) Resp:  [17-27] 20 (05/11 0600) BP: (109-164)/(64-119) 145/73 (05/11 0600) SpO2:  [93 %-97 %] 96 % (05/11 0600) Weight:  [208 lb 8.9 oz (94.6 kg)] 208 lb 8.9 oz (94.6 kg) (05/11 0500) Last BM Date: 08/16/16  Intake/Output from previous day: 05/10 0701 - 05/11 0700 In: 3676.3 [P.O.:1350; I.V.:2176.3; IV Piggyback:150] Out: -  Intake/Output this shift: Total I/O In: 240 [P.O.:240] Out: -   General appearance: alert, cooperative and no distress GI: soft, non-tender; bowel sounds normal; no masses,  no organomegaly  Lab Results:   Recent Labs  08/18/16 0432 08/19/16 0433  WBC 9.6 8.9  HGB 11.6* 12.1*  HCT 35.2* 37.1*  PLT 298 318   BMET  Recent Labs  08/18/16 0432 08/19/16 0433  NA 139 140  K 4.0 4.1  CL 109 108  CO2 22 25  GLUCOSE 85 107*  BUN 26* 24*  CREATININE 1.57* 1.57*  CALCIUM 8.5* 8.7*   PT/INR  Recent Labs  08/16/16 1726  LABPROT 15.1  INR 1.18    Studies/Results: Dg Ercp Biliary & Pancreatic Ducts  Result Date: 08/17/2016 CLINICAL DATA:  66 year old male with choledocholithiasis EXAM: ERCP TECHNIQUE: Multiple spot images obtained with the fluoroscopic device and submitted for interpretation post-procedure. FLUOROSCOPY TIME:  Fluoroscopy Time:  0 minutes 39 seconds reported COMPARISON:  MRCP 08/16/2016 FINDINGS: Two cine clips and a saved image demonstrate a flexible endoscope in the descending duodenum with wire cannulation of the common bile duct and cholangiogram. There is a small mobile filling defect in the distal common bile duct consistent with choledocholithiasis. No significant biliary ductal dilatation. The images then demonstrate sphincterotomy and basket sweep of the common duct.  IMPRESSION: ERCP with sphincterotomy and basket sweep of the common duct. These images were submitted for radiologic interpretation only. Please see the procedural report for the amount of contrast and the fluoroscopy time utilized. Electronically Signed   By: Malachy MoanHeath  McCullough M.D.   On: 08/17/2016 16:52    Anti-infectives: Anti-infectives    Start     Dose/Rate Route Frequency Ordered Stop   08/16/16 1730  piperacillin-tazobactam (ZOSYN) IVPB 3.375 g     3.375 g 12.5 mL/hr over 240 Minutes Intravenous Every 8 hours 08/16/16 1719     08/16/16 1715  piperacillin-tazobactam (ZOSYN) IVPB 3.375 g  Status:  Discontinued     3.375 g 100 mL/hr over 30 Minutes Intravenous  Once 08/16/16 1714 08/16/16 1718   08/16/16 1345  cefTRIAXone (ROCEPHIN) 1 g in dextrose 5 % 50 mL IVPB     1 g 100 mL/hr over 30 Minutes Intravenous  Once 08/16/16 1335 08/16/16 1518      Assessment/Plan: s/p Procedure(s): ENDOSCOPIC RETROGRADE CHOLANGIOPANCREATOGRAPHY (ERCP) Biliary sphincterotomy and stone extraction Impression: Cholangitis resolving. Acute renal failure resolving.  Plan: Okay for discharge from surgery standpoint. We will proceed with open cholecystectomy on 08/26/2016.  LOS: 3 days    Javier Walker 08/19/2016

## 2016-08-19 NOTE — Progress Notes (Signed)
  Subjective:  Patient has no complaints. He denies abdominal pain nausea or vomiting. He ate all of his supper last evening and did not have any symptoms. Similarly had no difficulty with breakfast she did not care for.  Objective: Blood pressure (!) 145/73, pulse 61, temperature 98.2 F (36.8 C), temperature source Oral, resp. rate 20, height 5\' 11"  (1.803 m), weight 208 lb 8.9 oz (94.6 kg), SpO2 96 %. Patient is alert and in no acute distress.   Labs/studies Results:   Recent Labs  08/17/16 0410 08/18/16 0432 08/19/16 0433  WBC 18.7* 9.6 8.9  HGB 12.0* 11.6* 12.1*  HCT 35.8* 35.2* 37.1*  PLT 262 298 318    BMET   Recent Labs  08/17/16 0410 08/18/16 0432 08/19/16 0433  NA 139 139 140  K 4.0 4.0 4.1  CL 107 109 108  CO2 22 22 25   GLUCOSE 83 85 107*  BUN 34* 26* 24*  CREATININE 2.23* 1.57* 1.57*  CALCIUM 8.5* 8.5* 8.7*    LFT   Recent Labs  08/17/16 0410 08/18/16 0432 08/19/16 0433  PROT 6.4* 6.4* 6.6  ALBUMIN 2.4* 2.3* 2.5*  AST 303* 121* 56*  ALT 328* 216* 145*  ALKPHOS 90 114 81  BILITOT 7.1* 5.1* 2.7*    PT/INR   Recent Labs  08/16/16 1726  LABPROT 15.1  INR 1.18    Hepatitis Panel   Recent Labs  08/16/16 1726  HEPBSAG Negative  HCVAB <0.1  HEPAIGM Negative  HEPBIGM Negative     Assessment:  #1.Cholangitis due to choledocholithiasis. Status post therapeutic ERCP 2 days ago. Patient has been asymptomatic or 24 hours. Continued improvement in LFTs. Agree with plans for discharge and cholecystectomy next week. He will have LFTs prior to cholecystectomy.

## 2016-08-19 NOTE — Discharge Summary (Addendum)
Javier Walker, is a 66 y.o. male  DOB Nov 01, 1950  MRN [id number].  Admission date:  08/16/2016  Admitting Physician  Erick Blinks, MD  Discharge Date:  08/19/2016   Primary MD  Pllc, [facility]  Recommendations for primary care physician for things to follow:  - Please check CBC, CMP during his visit to ensure continued improvement of LFTs - Patient is scheduled for open cholecystectomy Dr. Lovell Sheehan on 5/18   Admission Diagnosis  Shortness of breath [R06.02] Pain [R52] Acute renal failure, unspecified acute renal failure type (HCC) [N17.9]   Discharge Diagnosis  Shortness of breath [R06.02] Pain [R52] Acute renal failure, unspecified acute renal failure type (HCC) [N17.9]    Active Problems:   Uncontrolled type 2 diabetes mellitus with complication, without long-term current use of insulin (HCC)   Essential hypertension, benign   Hypercholesteremia   Sepsis (HCC)   AKI (acute kidney injury) (HCC)   Dehydration   Elevated LFTs   Jaundice   UTI (urinary tract infection)   Calculus of gallbladder and bile duct with obstruction without cholecystitis   Cholangitis      Past Medical History:  Diagnosis Date  . Diabetes mellitus without complication (HCC)   . GERD (gastroesophageal reflux disease)    occ-no meds  . Hyperlipemia   . Hypertension   . Shortness of breath     Past Surgical History:  Procedure Laterality Date  . COLON SURGERY  2011   gangrene lower colon with colostomy  . COLOSTOMY REVERSAL  2011  . OPEN REDUCTION INTERNAL FIXATION (ORIF) TIBIA/FIBULA FRACTURE Right 08/30/2012   Procedure: OPEN REDUCTION INTERNAL FIXATION (ORIF) TIBIA/FIBULA FRACTURE;  Surgeon: Toni Arthurs, MD;  Location: [facility];  Service: Orthopedics;  Laterality: Right;  . WOUND DEBRIDEMENT  2011   abd-       History of present illness and  Hospital Course:      Kindly see H&P for history of present illness and admission details, please review complete Labs, Consult reports and Test reports for all details in brief  HPI  from the history and physical done on the day of admission 08/16/2016  HPI: Javier Walker is a 66 y.o. male with medical history significant of hypertension, diabetes, hyperlipidemia, presents to the ER with complaints of abdominal pain for the past 10 days. Pain has been intermittent, located throughout the abdomen. He has had associated nausea and vomiting. By mouth intake has been poor the past week. He has not had any fevers. He began having diarrhea this morning. He's noticed decreased urine output since this morning which led him to come in the emergency room. He is feeling dizzy on standing and has fallen multiple times. He is generally weak. He went to [facility] yesterday and underwent abdominal ultrasound. Findings indicated cholelithiasis with gallbladder wall thickening. He was discharged home with tramadol. He says he does occasionally take Tylenol and ibuprofen. Denies any heavy alcohol consumption.  ED Course: In the emergency room, he was noted to be hypotensive with  a systolic blood pressure in the 80s. He was afebrile. Lab work indicated elevated creatinine at 3.6. Bilirubin noted to be elevated at 8.2. Transaminases were AST 465, ALT 334. WBC count noted to be markedly elevated at 28.8. CT scan of the abdomen and pelvis indicated evidence of cholelithiasis. Common bile duct was noted to be normal diameter. Urinalysis indicated a possible infection. Chest x-ray did not show any significant findings.  Hospital Course  66 y.o.malewith medical history significant of hypertension, diabetes, hyperlipidemia, presents to the ER with complaints of abdominal pain for the past 10 days, workup significant for elevated LFTs secondary to cholangitis, Status post ERCP 08/17/2016 with stone extraction.  Elevated LFTs secondary  to cholangitis - Patient presents with right upper quadrant pain, elevated LFTs, with hyperbilirubinemia, white count of 28.8K, MRCP significant for cholelithiasis/choledocholithiasis with 4 mm stone in  distal common bile duct. - GI consult greatly appreciated,Went for ERCP 5/9, with stone extraction. - LFTs trending down, continue to monitor closely, patient is afebrile, white blood cell has normalized, blood cultures remain with no growth to date, tolerating regular diet, treated with IV Zosyn during hospital stay, neurosurgeon but currently appreciated, plan for open cholecystectomy next week, will discharge in another 10 days of oral Augmentin.   Acute renal failure - This is most likely related to volume depletion, dehydration, probably ATN from infectious process. - continue  to improve, creatinine is 1.5 on discharge  UTI - Urine white blood cells too numerous to count, urine cultures not helpful as growing Multiple species,  treated with Zosyn.  Duodenal ulcer - Evidence of Bulbar  ulcer on ERCP, will start on PPI, avoid NSAIDs and aspirin, for 3 days. Started to resume aspirin on 5/15  Diarrhea - C. difficile negative  Diabetes mellitus -   resume home medication on discharge  Hyperlipidemia - Continue to hold statin secondary to elevated liver enzymes  Hypertension - Started to increase, so we'll resume home medication on discharge  Discharge Condition:  Stable Discussed with wife at bedside   Follow UP  Follow-up Information    Pllc, [facility] Follow up in 1 week(s).   Specialty:  Family Medicine Contact information: [address] [place] [id number] [phone]        Franky MachoJenkins, Mark, MD Follow up.   Specialty:  General Surgery Contact information: [address] [place] [address] [phone]             Discharge Instructions  and  Discharge Medications     Discharge Instructions    Discharge  instructions    Complete by:  As directed    Follow with Primary MD Pllc, [facility] in 7 days   Get CBC, CMP,checked  by Primary MD next visit.    Activity: As tolerated with Full fall precautions use walker/cane & assistance as needed   Disposition Home    Diet: Heart Healthy , low fat , carb modified  On your next visit with your primary care physician please Get Medicines reviewed and adjusted.   Please request your Prim.MD to go over all Hospital Tests and Procedure/Radiological results at the follow up, please get all Hospital records sent to your Prim MD by signing hospital release before you go home.   If you experience worsening of your admission symptoms, develop shortness of breath, life threatening emergency, suicidal or homicidal thoughts you must seek medical attention immediately by calling 911 or calling your MD immediately  if symptoms less severe.  You Must read complete instructions/literature along with all the possible adverse reactions/side effects for all the Medicines you take and that have been prescribed to you. Take any new Medicines after you have completely understood and accpet all the possible adverse reactions/side effects.   Do not drive, operating heavy machinery, perform activities at heights, swimming or participation in water activities or provide baby sitting services if your were admitted for syncope or siezures until you have seen by Primary MD or a Neurologist and advised to do so again.  Do not drive when taking Pain medications.    Do not take more than prescribed Pain, Sleep and Anxiety Medications  Special Instructions: If you have smoked or chewed Tobacco  in the last 2 yrs please stop smoking, stop any regular Alcohol  and or any Recreational drug use.  Wear Seat belts while driving.   Please note  You were cared for by a hospitalist during your hospital stay. If you have any questions about your discharge  medications or the care you received while you were in the hospital after you are discharged, you can call the unit and asked to speak with the hospitalist on call if the hospitalist that took care of you is not available. Once you are discharged, your primary care physician will handle any further medical issues. Please note that NO REFILLS for any discharge medications will be authorized once you are discharged, as it is imperative that you return to your primary care physician (or establish a relationship with a primary care physician if you do not have one) for your aftercare needs so that they can reassess your need for medications and monitor your lab values.   Increase activity slowly    Complete by:  As directed      Allergies as of 08/19/2016   No Known Allergies     Medication List    STOP taking these medications   simvastatin 40 MG tablet Commonly known as:  ZOCOR     TAKE these medications   amoxicillin-clavulanate 875-125 MG tablet Commonly known as:  AUGMENTIN Take 1 tablet by mouth 2 (two) times daily.   aspirin 81 MG chewable tablet Chew 1 tablet (81 mg total) by mouth daily. Intended to hold for next 3 days, and resume on 08/23/2016 Start taking on:  08/23/2016 What changed:  how much to take  additional instructions  These instructions start on 08/23/2016. If you are unsure what to do until then, ask your doctor or other care provider.   Canagliflozin-Metformin HCl ER [phone] MG Tb24 Commonly known as:  INVOKAMET XR Take 1 tablet by mouth daily with breakfast.   fenofibrate 145 MG tablet Commonly known as:  TRICOR Take 1 tablet (145 mg total) by mouth daily.   Fish Oil 1000 MG Caps Take 1 capsule (1,000 mg total) by mouth 2 (two) times daily.   lisinopril 10 MG tablet Commonly known as:  PRINIVIL,ZESTRIL Take 1 tablet (10 mg total) by mouth daily.   pantoprazole 40 MG tablet Commonly known as:  PROTONIX Take 1 tablet (40 mg total) by mouth 2 (two)  times daily.   saccharomyces boulardii 250 MG capsule Commonly known as:  FLORASTOR Take 1 capsule (250 mg total) by mouth 2 (two) times daily.   traMADol 50 MG tablet Commonly known as:  ULTRAM Take 50 mg by mouth daily as needed for moderate pain.         Diet and Activity recommendation: See Discharge Instructions above  Consults obtained -  GI General Surgery   Major procedures and Radiology Reports - PLEASE review detailed and final reports for all details, in brief -   ERCP by Dr. Karilyn Cota on 5/9 - Bulbar ulcer and erosions.  - Choledocholithiasis was found. Complete removal                            was accomplished by biliary sphincterotomy and                            basket extraction.                           - A biliary sphincterotomy was performed.                           - The biliary tree was swept.  Ct Abdomen Pelvis Wo Contrast  Result Date: 08/16/2016 CLINICAL DATA:  Abdominal pain, nausea and vomiting intermittently for weeks. Reported history of cholelithiasis. EXAM: CT ABDOMEN AND PELVIS WITHOUT CONTRAST TECHNIQUE: Multidetector CT imaging of the abdomen and pelvis was performed following the standard protocol without IV contrast. COMPARISON:  None. FINDINGS: Lower chest: Nonspecific patchy subpleural reticulation at both lung bases with mild traction bronchiolectasis. Left lower lobe 2 mm solid pulmonary nodule (series 4/image 16). Coronary atherosclerosis. Hepatobiliary: Mild diffuse hepatic steatosis. No liver mass. Questionable fine liver surface irregularity. Cholelithiasis. No gallbladder wall thickening, gallbladder distention or pericholecystic fluid. No biliary ductal dilatation. Pancreas: Heterogeneous fatty infiltration of the pancreas, with no pancreatic mass or duct dilation. Spleen: Normal size. No mass. Adrenals/Urinary Tract: Normal adrenals. No hydronephrosis. No renal stones. Exophytic 0.7 cm renal cortical lesion in the lateral upper left  kidney, too small to characterize, for which no further follow-up is required. No additional contour deforming renal mass. Normal caliber ureters, with no ureteral stones. Normal bladder. Stomach/Bowel: Grossly normal stomach. There is a small periumbilical hernia containing a tiny portion of a mid small bowel loop (series 2/ image 64). Normal caliber small bowel. No small bowel wall thickening or pneumatosis. Normal appendix. Oral contrast reaches the distal rectum. Normal large bowel with no diverticulosis, large bowel wall thickening or pericolonic fat stranding. Vascular/Lymphatic: Atherosclerotic nonaneurysmal abdominal aorta. No pathologically enlarged lymph nodes in the abdomen or pelvis. Reproductive: Top-normal size prostate with nonspecific internal prostatic calcifications. Other: No pneumoperitoneum, ascites or focal fluid collection. Musculoskeletal: No aggressive appearing focal osseous lesions. Mild thoracolumbar spondylosis. IMPRESSION: 1. Small periumbilical hernia containing a tiny portion of a mid small bowel loop, with no evidence of bowel obstruction or ischemia. 2. Diffuse hepatic steatosis. Question fine liver surface irregularity, cannot exclude early cirrhosis. Consider hepatic elastography for further liver fibrosis risk stratification, as clinically warranted. 3. Nonspecific findings at the lung bases that may indicate interstitial lung disease. Outpatient high-resolution chest CT may be obtained as clinically warranted. 4. Additional findings include cholelithiasis, aortic atherosclerosis and coronary atherosclerosis. Electronically Signed   By: Delbert Phenix M.D.   On: 08/16/2016 12:30   Mr Abdomen Mrcp Wo Contrast  Result Date: 08/17/2016 CLINICAL DATA:  Abdominal pain and elevated liver function tests. EXAM: MRI ABDOMEN WITHOUT CONTRAST  (INCLUDING MRCP) TECHNIQUE: Multiplanar multisequence MR imaging of the abdomen was performed. Heavily T2-weighted images of the biliary and  pancreatic ducts were obtained, and three-dimensional MRCP images were rendered by  post processing. COMPARISON:  08/16/2016 CT scan FINDINGS: Despite efforts by the technologist and patient, motion artifact is present on today's exam and could not be eliminated. This reduces exam sensitivity and specificity. Lower chest: Interstitial accentuation at the lung bases better shown on recent CT. Hepatobiliary: Mild diffuse hepatic steatosis. Morphologic findings in the liver supportive of early cirrhosis. Subtle geographic T2 hyperintensity posteriorly in the right hepatic lobe without a well-defined associated mass, some of this could be due to fibrosis. And some is probably due to relative fatty sparing. I do not see a discrete mass; IV contrast was not administered. Numerous small gallstones in the 2-4 mm range are present dependently within the gallbladder. Normal caliber biliary tree on image 7/15 and image 1/6 there is a suggestion of a 4 mm filling defect in the distal common bile duct. , suspicious for choledocholithiasis. Images 48-49 of series 12 provide a supportive appearance. Likely small exophytic 7 mm cyst adjacent to the posterior aspect of the right hepatic lobe on image 41/4. Pancreas:  Unremarkable Spleen:  Unremarkable Adrenals/Urinary Tract: Stable 6 mm exophytic left kidney upper pole lesion with low T2 signal, probably a tiny complex cyst. Unchanged from CT. Otherwise unremarkable. Stomach/Bowel: Unremarkable Vascular/Lymphatic: Scattered small celiac and peripancreatic lymph nodes are not overtly pathologically enlarged by size criteria a portacaval node measures 1.2 cm in short axis. Aortoiliac atherosclerotic vascular disease. Other:  No supplemental non-categorized findings. Musculoskeletal: Probable small hemangioma in the L4 vertebral body on image 13/3. IMPRESSION: 1. Cholelithiasis along with a 4 mm distal CBD stone compatible with choledocholithiasis. No biliary dilatation. 2. Geographic  hepatic steatosis. There is some mild sparing posteriorly in the right hepatic lobe. Difficult to completely exclude the possibility of low-level fibrosis posteriorly in the right hepatic lobe. No obvious hepatic mass on noncontrast imaging. 3. There is some mild early morphologic findings of cirrhosis in the liver as suggested on CT. 4. Interstitial accentuation in the lung bases. 5. Small likely complex cyst of the left kidney upper pole, about 6 mm in diameter. Most likely incidental. 6.  Aortic Atherosclerosis (ICD10-I70.0). Electronically Signed   By: Gaylyn Rong M.D.   On: 08/17/2016 08:01   Mr 3d Recon At Scanner  Result Date: 08/17/2016 CLINICAL DATA:  Abdominal pain and elevated liver function tests. EXAM: MRI ABDOMEN WITHOUT CONTRAST  (INCLUDING MRCP) TECHNIQUE: Multiplanar multisequence MR imaging of the abdomen was performed. Heavily T2-weighted images of the biliary and pancreatic ducts were obtained, and three-dimensional MRCP images were rendered by post processing. COMPARISON:  08/16/2016 CT scan FINDINGS: Despite efforts by the technologist and patient, motion artifact is present on today's exam and could not be eliminated. This reduces exam sensitivity and specificity. Lower chest: Interstitial accentuation at the lung bases better shown on recent CT. Hepatobiliary: Mild diffuse hepatic steatosis. Morphologic findings in the liver supportive of early cirrhosis. Subtle geographic T2 hyperintensity posteriorly in the right hepatic lobe without a well-defined associated mass, some of this could be due to fibrosis. And some is probably due to relative fatty sparing. I do not see a discrete mass; IV contrast was not administered. Numerous small gallstones in the 2-4 mm range are present dependently within the gallbladder. Normal caliber biliary tree on image 7/15 and image 1/6 there is a suggestion of a 4 mm filling defect in the distal common bile duct. , suspicious for choledocholithiasis.  Images 48-49 of series 12 provide a supportive appearance. Likely small exophytic 7 mm cyst adjacent to the posterior aspect of  the right hepatic lobe on image 41/4. Pancreas:  Unremarkable Spleen:  Unremarkable Adrenals/Urinary Tract: Stable 6 mm exophytic left kidney upper pole lesion with low T2 signal, probably a tiny complex cyst. Unchanged from CT. Otherwise unremarkable. Stomach/Bowel: Unremarkable Vascular/Lymphatic: Scattered small celiac and peripancreatic lymph nodes are not overtly pathologically enlarged by size criteria a portacaval node measures 1.2 cm in short axis. Aortoiliac atherosclerotic vascular disease. Other:  No supplemental non-categorized findings. Musculoskeletal: Probable small hemangioma in the L4 vertebral body on image 13/3. IMPRESSION: 1. Cholelithiasis along with a 4 mm distal CBD stone compatible with choledocholithiasis. No biliary dilatation. 2. Geographic hepatic steatosis. There is some mild sparing posteriorly in the right hepatic lobe. Difficult to completely exclude the possibility of low-level fibrosis posteriorly in the right hepatic lobe. No obvious hepatic mass on noncontrast imaging. 3. There is some mild early morphologic findings of cirrhosis in the liver as suggested on CT. 4. Interstitial accentuation in the lung bases. 5. Small likely complex cyst of the left kidney upper pole, about 6 mm in diameter. Most likely incidental. 6.  Aortic Atherosclerosis (ICD10-I70.0). Electronically Signed   By: Gaylyn Rong M.D.   On: 08/17/2016 08:01   Dg Chest Port 1 View  Result Date: 08/16/2016 CLINICAL DATA:  Shortness of breath and abdominal pain EXAM: PORTABLE CHEST 1 VIEW COMPARISON:  05/19/2009 FINDINGS: Cardiac shadow is at the upper limits of normal in size accentuated by the portable technique. Lungs are clear bilaterally. No acute bony abnormalities is noted. IMPRESSION: No active disease. Electronically Signed   By: Alcide Clever M.D.   On: 08/16/2016 14:14    Dg Ercp Biliary & Pancreatic Ducts  Result Date: 08/17/2016 CLINICAL DATA:  66 year old male with choledocholithiasis EXAM: ERCP TECHNIQUE: Multiple spot images obtained with the fluoroscopic device and submitted for interpretation post-procedure. FLUOROSCOPY TIME:  Fluoroscopy Time:  0 minutes 39 seconds reported COMPARISON:  MRCP 08/16/2016 FINDINGS: Two cine clips and a saved image demonstrate a flexible endoscope in the descending duodenum with wire cannulation of the common bile duct and cholangiogram. There is a small mobile filling defect in the distal common bile duct consistent with choledocholithiasis. No significant biliary ductal dilatation. The images then demonstrate sphincterotomy and basket sweep of the common duct. IMPRESSION: ERCP with sphincterotomy and basket sweep of the common duct. These images were submitted for radiologic interpretation only. Please see the procedural report for the amount of contrast and the fluoroscopy time utilized. Electronically Signed   By: Malachy Moan M.D.   On: 08/17/2016 16:52    Micro Results    Recent Results (from the past 240 hour(s))  Urine culture     Status: Abnormal   Collection Time: 08/16/16  8:13 AM  Result Value Ref Range Status   Specimen Description URINE, CLEAN CATCH  Final   Special Requests Normal  Final   Culture MULTIPLE SPECIES PRESENT, SUGGEST RECOLLECTION (A)  Final   Report Status 08/18/2016 FINAL  Final  MRSA PCR Screening     Status: None   Collection Time: 08/16/16  3:55 PM  Result Value Ref Range Status   MRSA by PCR NEGATIVE NEGATIVE Final    Comment:        The GeneXpert MRSA Assay (FDA approved for NASAL specimens only), is one component of a comprehensive MRSA colonization surveillance program. It is not intended to diagnose MRSA infection nor to guide or monitor treatment for MRSA infections.   Culture, blood (x 2)     Status: None (Preliminary  result)   Collection Time: 08/16/16  5:21 PM   Result Value Ref Range Status   Specimen Description LEFT ANTECUBITAL  Final   Special Requests   Final    BOTTLES DRAWN AEROBIC AND ANAEROBIC Blood Culture adequate volume   Culture NO GROWTH 3 DAYS  Final   Report Status PENDING  Incomplete  Culture, blood (x 2)     Status: None (Preliminary result)   Collection Time: 08/16/16  5:31 PM  Result Value Ref Range Status   Specimen Description RIGHT ANTECUBITAL  Final   Special Requests   Final    BOTTLES DRAWN AEROBIC AND ANAEROBIC Blood Culture adequate volume   Culture NO GROWTH 3 DAYS  Final   Report Status PENDING  Incomplete  C difficile quick scan w PCR reflex     Status: None   Collection Time: 08/17/16  3:15 AM  Result Value Ref Range Status   C Diff antigen NEGATIVE NEGATIVE Final   C Diff toxin NEGATIVE NEGATIVE Final   C Diff interpretation No C. difficile detected.  Final       Today   Subjective:   Javier Walker today has no headache,no chest or abdominal pain, no nausea or vomiting, tolerating diet, no new weakness tingling or numbness, feels much better wants to go home today.  Objective:   Blood pressure (!) 145/73, pulse 61, temperature 98.2 F (36.8 C), temperature source Oral, resp. rate 20, height 5\' 11"  (1.803 m), weight 94.6 kg (208 lb 8.9 oz), SpO2 96 %.   Intake/Output Summary (Last 24 hours) at 08/19/16 1236 Last data filed at 08/19/16 0900  Gross per 24 hour  Intake          2336.25 ml  Output                0 ml  Net          2336.25 ml    Exam Awake Alert, Oriented x 3, No new F.N deficits, Normal affect Supple Neck,No JVD Symmetrical Chest wall movement, Good air movement bilaterally, CTAB RRR,No Gallops,Rubs or new Murmurs, No Parasternal Heave +ve B.Sounds, Abd Soft, Non tender, No rebound -guarding or rigidity. No Cyanosis, Clubbing or edema, No new Rash or bruise  Data Review   CBC w Diff:  Lab Results  Component Value Date   WBC 8.9 08/19/2016   HGB 12.1 (L) 08/19/2016    HCT 37.1 (L) 08/19/2016   PLT 318 08/19/2016   LYMPHOPCT 7 08/16/2016   MONOPCT 4 08/16/2016   EOSPCT 0 08/16/2016   BASOPCT 0 08/16/2016    CMP:  Lab Results  Component Value Date   NA 140 08/19/2016   K 4.1 08/19/2016   CL 108 08/19/2016   CO2 25 08/19/2016   BUN 24 (H) 08/19/2016   CREATININE 1.57 (H) 08/19/2016   CREATININE 1.37 (H) 04/26/2016   PROT 6.6 08/19/2016   ALBUMIN 2.5 (L) 08/19/2016   BILITOT 2.7 (H) 08/19/2016   ALKPHOS 81 08/19/2016   AST 56 (H) 08/19/2016   ALT 145 (H) 08/19/2016  .   Total Time in preparing paper work, data evaluation and todays exam - 35 minutes  Shana Zavaleta M.D on 08/19/2016 at 12:36 PM  [facility]   Office  [phone]

## 2016-08-21 LAB — CULTURE, BLOOD (ROUTINE X 2)
CULTURE: NO GROWTH
Culture: NO GROWTH
SPECIAL REQUESTS: ADEQUATE
Special Requests: ADEQUATE

## 2016-08-22 ENCOUNTER — Encounter (HOSPITAL_COMMUNITY): Payer: Self-pay | Admitting: Internal Medicine

## 2016-08-22 NOTE — Patient Instructions (Signed)
Javier Walker  08/22/2016     @PREFPERIOPPHARMACY @   Your procedure is scheduled on  08/26/2016   Report to [facility] at  830  A.M.  Call this number if you have problems the morning of surgery:  [phone]   Remember:  Do not eat food or drink liquids after midnight.  Take these medicines the morning of surgery with A SIP OF WATER  Lisinopril.   Do not wear jewelry, make-up or nail polish.  Do not wear lotions, powders, or perfumes, or deoderant.  Do not shave 48 hours prior to surgery.  Men may shave face and neck.  Do not bring valuables to the hospital.  [facility] is not responsible for any belongings or valuables.  Contacts, dentures or bridgework may not be worn into surgery.  Leave your suitcase in the car.  After surgery it may be brought to your room.  For patients admitted to the hospital, discharge time will be determined by your treatment team.  Patients discharged the day of surgery will not be allowed to drive home.   Name and phone number of your driver:   family Special instructions:  None  Please read over the following fact sheets that you were given. Anesthesia Post-op Instructions and Care and Recovery After Surgery       Open Cholecystectomy Open cholecystectomy is surgery to remove the gallbladder. The gallbladder is a pear-shaped organ that lies beneath the liver on the right side of the body. The gallbladder stores bile, which is a fluid that helps the body to digest fats. Cholecystectomy is often done for inflammation of the gallbladder (cholecystitis). This condition is usually caused by a buildup of gallstones (cholelithiasis) in the gallbladder. Gallstones can block the flow of bile, which can result in inflammation and pain. In severe cases, emergency surgery may be required. Tell a health care provider about:  Any allergies you have.  All medicines you are taking, including vitamins, herbs, eye drops, creams, and  over-the-counter medicines.  Any problems you or family members have had with anesthetic medicines.  Any blood disorders you have.  Any surgeries you have had.  Any medical conditions you have.  Whether you are pregnant or may be pregnant. What are the risks? Generally, this is a safe procedure. However, problems may occur, including:  Infection.  Bleeding.  Allergic reactions to medicines.  Damage to other structures or organs.  A stone remaining in the common bile duct. The common bile duct carries bile from the gallbladder into the small intestine.  A bile leak from the cyst duct that is clipped when your gallbladder is removed. What happens before the procedure? Staying hydrated  Follow instructions from your health care provider about hydration, which may include:  Up to 2 hours before the procedure - you may continue to drink clear liquids, such as water, clear fruit juice, black coffee, and plain tea. Eating and drinking restrictions  Follow instructions from your health care provider about eating and drinking, which may include:  8 hours before the procedure - stop eating heavy meals or foods such as meat, fried foods, or fatty foods.  6 hours before the procedure - stop eating light meals or foods, such as toast or cereal.  6 hours before the procedure - stop drinking milk or drinks that contain milk.  2 hours before the procedure - stop drinking clear liquids. Medicines   Ask your health care provider  about:  Changing or stopping your regular medicines. This is especially important if you are taking diabetes medicines or blood thinners.  Taking medicines such as aspirin and ibuprofen. These medicines can thin your blood. Do not take these medicines before your procedure if your health care provider instructs you not to.  You may be given antibiotic medicine to help prevent infection. General instructions   Let your health care provider know if you develop  a cold or an infection before surgery.  Plan to have someone take you home from the hospital or clinic.  Ask your health care provider how your surgical site will be marked or identified. What happens during the procedure?  To reduce your risk of infection:  Your health care team will wash or sanitize their hands.  Your skin will be washed with soap.  Hair may be removed from the surgical area.  An IV tube may be inserted into one of your veins.  You will be given one or more of the following:  A medicine to help you relax (sedative).  A medicine to make you fall asleep (general anesthetic).  A breathing tube will be placed in your mouth.  Your surgeon will make a cut (incision) in the upper abdomen to access your gallbladder.  Your gallbladder will be removed.  Your common bile duct may be examined. If stones are found in the common bile duct, they may be removed.  After your gallbladder has been removed, the incisions will be closed with stitches (sutures), skin glue, or staples.  Your incision will be covered with a bandage (dressing). The procedure may vary among health care providers and hospitals. What happens after the procedure?  Your blood pressure, heart rate, breathing rate, and blood oxygen level will be monitored until the medicines you were given have worn off.  You will be given medicines as needed to control your pain.  Do not drive for 24 hours if you were given a sedative. This information is not intended to replace advice given to you by your health care provider. Make sure you discuss any questions you have with your health care provider. Document Released: 12/18/2001 Document Revised: 10/17/2015 Document Reviewed: 09/14/2015 Elsevier Interactive Patient Education  2017 [employer]  Open Cholecystectomy, Care After This sheet gives you information about how to care for yourself after your procedure. Your health care provider may also give you more  specific instructions. If you have problems or questions, contact your health care provider. What can I expect after the procedure? After the procedure, it is common to have:  Pain at your incision site. You will be given medicines to control this pain.  Mild nausea or vomiting. Follow these instructions at home: Incision care    Follow instructions from your health care provider about how to take care of your incision. Make sure you:  Wash your hands with soap and water before you change your bandage (dressing). If soap and water are not available, use hand sanitizer.  Change your dressing as told by your health care provider.  Leave stitches (sutures), skin glue, or adhesive strips in place. These skin closures may need to be in place for 2 weeks or longer. If adhesive strip edges start to loosen and curl up, you may trim the loose edges. Do not remove adhesive strips completely unless your health care provider tells you to do that.  Do not take baths, swim, or use a hot tub until your health care provider approves. Ask  your health care provider if you can take showers. You may only be allowed to take sponge baths for bathing.  Check your incision area every day for signs of infection. Check for:  More redness, swelling, or pain.  More fluid or blood.  Warmth.  Pus or a bad smell. Activity   Do not drive or use heavy machinery while taking prescription pain medicine.  Do not lift anything that is heavier than 10 lb (4.5 kg) until your health care provider approves.  Do not play contact sports until your health care provider approves.  Do not drive for 24 hours if you were given a medicine to help you relax (sedative).  Rest as needed. Do not return to work or school until your health care provider approves. General instructions   Take over-the-counter and prescription medicines only as told by your health care provider.  To prevent or treat constipation while you are  taking prescription pain medicine, your health care provider may recommend that you:  Drink enough fluid to keep your urine clear or pale yellow.  Take over-the-counter or prescription medicines.  Eat foods that are high in fiber, such as fresh fruits and vegetables, whole grains, and beans.  Limit foods that are high in fat and processed sugars, such as fried and sweet foods. Contact a health care provider if:  You develop a rash.  You have more redness, swelling, or pain around your incision.  You have more fluid or blood coming from your incision.  Your incision feels warm to the touch.  You have pus or a bad smell coming from your incision.  You have a fever.  Your incision breaks open. Get help right away if:  You have trouble breathing.  You have chest pain.  You have increasing pain in your shoulders.  You faint or feel dizzy when you stand.  You have severe pain in your abdomen.  You have nausea or vomiting that lasts for more than one day.  You have leg pain. This information is not intended to replace advice given to you by your health care provider. Make sure you discuss any questions you have with your health care provider. Document Released: 07/14/2003 Document Revised: 10/17/2015 Document Reviewed: 09/14/2015 Elsevier Interactive Patient Education  2017 [employer]  General Anesthesia, Adult General anesthesia is the use of medicines to make a person "go to sleep" (be unconscious) for a medical procedure. General anesthesia is often recommended when a procedure:  Is long.  Requires you to be still or in an unusual position.  Is major and can cause you to lose blood.  Is impossible to do without general anesthesia. The medicines used for general anesthesia are called general anesthetics. In addition to making you sleep, the medicines:  Prevent pain.  Control your blood pressure.  Relax your muscles. Tell a health care provider about:  Any  allergies you have.  All medicines you are taking, including vitamins, herbs, eye drops, creams, and over-the-counter medicines.  Any problems you or family members have had with anesthetic medicines.  Types of anesthetics you have had in the past.  Any bleeding disorders you have.  Any surgeries you have had.  Any medical conditions you have.  Any history of heart or lung conditions, such as heart failure, sleep apnea, or chronic obstructive pulmonary disease (COPD).  Whether you are pregnant or may be pregnant.  Whether you use tobacco, alcohol, marijuana, or street drugs.  Any history of [employer].  Any  history of depression or anxiety. What are the risks? Generally, this is a safe procedure. However, problems may occur, including:  Allergic reaction to anesthetics.  Lung and heart problems.  Inhaling food or liquids from your stomach into your lungs (aspiration).  Injury to nerves.  Waking up during your procedure and being unable to move (rare).  Extreme agitation or a state of mental confusion (delirium) when you wake up from the anesthetic.  Air in the bloodstream, which can lead to stroke. These problems are more likely to develop if you are having a major surgery or if you have an advanced medical condition. You can prevent some of these complications by answering all of your health care provider's questions thoroughly and by following all pre-procedure instructions. General anesthesia can cause side effects, including:  Nausea or vomiting  A sore throat from the breathing tube.  Feeling cold or shivery.  Feeling tired, washed out, or achy.  Sleepiness or drowsiness.  Confusion or agitation. What happens before the procedure? Staying hydrated  Follow instructions from your health care provider about hydration, which may include:  Up to 2 hours before the procedure - you may continue to drink clear liquids, such as water, clear fruit juice,  black coffee, and plain tea. Eating and drinking restrictions  Follow instructions from your health care provider about eating and drinking, which may include:  8 hours before the procedure - stop eating heavy meals or foods such as meat, fried foods, or fatty foods.  6 hours before the procedure - stop eating light meals or foods, such as toast or cereal.  6 hours before the procedure - stop drinking milk or drinks that contain milk.  2 hours before the procedure - stop drinking clear liquids. Medicines   Ask your health care provider about:  Changing or stopping your regular medicines. This is especially important if you are taking diabetes medicines or blood thinners.  Taking medicines such as aspirin and ibuprofen. These medicines can thin your blood. Do not take these medicines before your procedure if your health care provider instructs you not to.  Taking new dietary supplements or medicines. Do not take these during the week before your procedure unless your health care provider approves them.  If you are told to take a medicine or to continue taking a medicine on the day of the procedure, take the medicine with sips of water. General instructions    Ask if you will be going home the same day, the following day, or after a longer hospital stay.  Plan to have someone take you home.  Plan to have someone stay with you for the first 24 hours after you leave the hospital or clinic.  For 3-6 weeks before the procedure, try not to use any tobacco products, such as cigarettes, chewing tobacco, and e-cigarettes.  You may brush your teeth on the morning of the procedure, but make sure to spit out the toothpaste. What happens during the procedure?  You will be given anesthetics through a mask and through an IV tube in one of your veins.  You may receive medicine to help you relax (sedative).  As soon as you are asleep, a breathing tube may be used to help you breathe.  An  anesthesia specialist will stay with you throughout the procedure. He or she will help keep you comfortable and safe by continuing to give you medicines and adjusting the amount of medicine that you get. He or she will also watch  your blood pressure, pulse, and oxygen levels to make sure that the anesthetics do not cause any problems.  If a breathing tube was used to help you breathe, it will be removed before you wake up. The procedure may vary among health care providers and hospitals. What happens after the procedure?  You will wake up, often slowly, after the procedure is complete, usually in a recovery area.  Your blood pressure, heart rate, breathing rate, and blood oxygen level will be monitored until the medicines you were given have worn off.  You may be given medicine to help you calm down if you feel anxious or agitated.  If you will be going home the same day, your health care provider may check to make sure you can stand, drink, and urinate.  Your health care providers will treat your pain and side effects before you go home.  Do not drive for 24 hours if you received a sedative.  You may:  Feel nauseous and vomit.  Have a sore throat.  Have mental slowness.  Feel cold or shivery.  Feel sleepy.  Feel tired.  Feel sore or achy, even in parts of your body where you did not have surgery. This information is not intended to replace advice given to you by your health care provider. Make sure you discuss any questions you have with your health care provider. Document Released: 07/05/2007 Document Revised: 09/08/2015 Document Reviewed: 03/12/2015 Elsevier Interactive Patient Education  2017 [employer] General Anesthesia, Adult, Care After These instructions provide you with information about caring for yourself after your procedure. Your health care provider may also give you more specific instructions. Your treatment has been planned according to current medical  practices, but problems sometimes occur. Call your health care provider if you have any problems or questions after your procedure. What can I expect after the procedure? After the procedure, it is common to have:  Vomiting.  A sore throat.  Mental slowness. It is common to feel:  Nauseous.  Cold or shivery.  Sleepy.  Tired.  Sore or achy, even in parts of your body where you did not have surgery. Follow these instructions at home: For at least 24 hours after the procedure:   Do not:  Participate in activities where you could fall or become injured.  Drive.  Use heavy machinery.  Drink alcohol.  Take sleeping pills or medicines that cause drowsiness.  Make important decisions or sign legal documents.  Take care of children on your own.  Rest. Eating and drinking   If you vomit, drink water, juice, or soup when you can drink without vomiting.  Drink enough fluid to keep your urine clear or pale yellow.  Make sure you have little or no nausea before eating solid foods.  Follow the diet recommended by your health care provider. General instructions   Have a responsible adult stay with you until you are awake and alert.  Return to your normal activities as told by your health care provider. Ask your health care provider what activities are safe for you.  Take over-the-counter and prescription medicines only as told by your health care provider.  If you smoke, do not smoke without supervision.  Keep all follow-up visits as told by your health care provider. This is important. Contact a health care provider if:  You continue to have nausea or vomiting at home, and medicines are not helpful.  You cannot drink fluids or start eating again.  You cannot urinate after  8-12 hours.  You develop a skin rash.  You have fever.  You have increasing redness at the site of your procedure. Get help right away if:  You have difficulty breathing.  You have chest  pain.  You have unexpected bleeding.  You feel that you are having a life-threatening or urgent problem. This information is not intended to replace advice given to you by your health care provider. Make sure you discuss any questions you have with your health care provider. Document Released: 07/04/2000 Document Revised: 08/31/2015 Document Reviewed: 03/12/2015 [employer]vier Interactive Patient Education  2017 [employer]

## 2016-08-23 ENCOUNTER — Encounter (HOSPITAL_COMMUNITY)
Admit: 2016-08-23 | Discharge: 2016-08-23 | Disposition: A | Discharge status: Home / Self Care | Payer: BLUE CROSS/BLUE SHIELD | Attending: General Surgery | Admitting: General Surgery

## 2016-08-23 ENCOUNTER — Encounter (HOSPITAL_COMMUNITY): Payer: Self-pay

## 2016-08-23 HISTORY — DX: Urticaria, unspecified: L50.9

## 2016-08-23 LAB — CBC WITH DIFFERENTIAL/PLATELET
Basophils Absolute: 0 10*3/uL (ref 0.0–0.1)
Basophils Relative: 0 %
Eosinophils Absolute: 0.6 10*3/uL (ref 0.0–0.7)
Eosinophils Relative: 6 %
HEMATOCRIT: 42.5 % (ref 39.0–52.0)
Hemoglobin: 14.1 g/dL (ref 13.0–17.0)
LYMPHS ABS: 2.6 10*3/uL (ref 0.7–4.0)
LYMPHS PCT: 27 %
MCH: 29.4 pg (ref 26.0–34.0)
MCHC: 33.2 g/dL (ref 30.0–36.0)
MCV: 88.5 fL (ref 78.0–100.0)
MONO ABS: 0.4 10*3/uL (ref 0.1–1.0)
MONOS PCT: 4 %
NEUTROS ABS: 6.3 10*3/uL (ref 1.7–7.7)
Neutrophils Relative %: 63 %
Platelets: 360 10*3/uL (ref 150–400)
RBC: 4.8 MIL/uL (ref 4.22–5.81)
RDW: 14.3 % (ref 11.5–15.5)
WBC: 10 10*3/uL (ref 4.0–10.5)

## 2016-08-23 LAB — COMPREHENSIVE METABOLIC PANEL
ALT: 53 U/L (ref 17–63)
ANION GAP: 8 (ref 5–15)
AST: 35 U/L (ref 15–41)
Albumin: 3.1 g/dL — ABNORMAL LOW (ref 3.5–5.0)
Alkaline Phosphatase: 75 U/L (ref 38–126)
BILIRUBIN TOTAL: 1.6 mg/dL — AB (ref 0.3–1.2)
BUN: 20 mg/dL (ref 6–20)
CO2: 24 mmol/L (ref 22–32)
Calcium: 9.3 mg/dL (ref 8.9–10.3)
Chloride: 107 mmol/L (ref 101–111)
Creatinine, Ser: 1.55 mg/dL — ABNORMAL HIGH (ref 0.61–1.24)
GFR calc Af Amer: 53 mL/min — ABNORMAL LOW (ref >60)
GFR, EST NON AFRICAN AMERICAN: 45 mL/min — AB (ref >60)
Glucose, Bld: 131 mg/dL — ABNORMAL HIGH (ref 65–99)
POTASSIUM: 4.5 mmol/L (ref 3.5–5.1)
Sodium: 139 mmol/L (ref 135–145)
TOTAL PROTEIN: 7.5 g/dL (ref 6.5–8.1)

## 2016-08-23 NOTE — H&P (Signed)
Javier Signs, MD Physician Signed Surgery  Consult Note Date of Service: 08/18/2016 11:01 AM  Related encounter: ED to Hosp-Admission (Discharged) from 08/16/2016 in [facility] All Collapse All   '[]' Hide copied text '[]' Hover for attribution information Reason for Consult: Cholelithiasis Referring Physician: Dr. Shannan Harper Javier Walker is an 66 y.o. male.  HPI: Patient is a 66 year old white male who was admitted to the hospital for fever, chills, and abdominal pain. He ultimately was found to have cholelithiasis, choledocholithiasis with cholangitis. He underwent ERCP with stone extraction by Dr. Laural Golden yesterday. He tolerated the procedure well. Today, his liver enzyme tests are improving and his jaundice is resolving. His leukocytosis is also resolving. He feels much better. He denies any significant abdominal pain, nausea, vomiting. He states this was his first episode of right upper quadrant abdominal pain. He is hungry.      Past Medical History:  Diagnosis Date  . Diabetes mellitus without complication ([place])   . GERD (gastroesophageal reflux disease)    occ-no meds  . Hyperlipemia   . Hypertension   . Shortness of breath          Past Surgical History:  Procedure Laterality Date  . COLON SURGERY  2011   gangrene lower colon with colostomy  . COLOSTOMY REVERSAL  2011  . OPEN REDUCTION INTERNAL FIXATION (ORIF) TIBIA/FIBULA FRACTURE Right 08/30/2012   Procedure: OPEN REDUCTION INTERNAL FIXATION (ORIF) TIBIA/FIBULA FRACTURE;  Surgeon: Wylene Simmer, MD;  Location: [facility];  Service: Orthopedics;  Laterality: Right;  . WOUND DEBRIDEMENT  2011   abd-         Family History  Problem Relation Age of Onset  . Heart attack Mother   . Heart attack Father     Social History:  reports that he quit smoking about 27 years ago. He has never used smokeless tobacco. He reports that he drinks alcohol. He reports that he  does not use drugs.  Allergies: No Known Allergies  Medications:  Prior to Admission:         Prescriptions Prior to Admission  Medication Sig Dispense Refill Last Dose  . aspirin 81 MG chewable tablet Chew by mouth daily.   08/15/2016 at Unknown time  . Canagliflozin-Metformin HCl ER (INVOKAMET XR) [id number] MG TB24 Take 1 tablet by mouth daily with breakfast. 30 tablet 3 08/15/2016 at Unknown time  . fenofibrate (TRICOR) 145 MG tablet Take 1 tablet (145 mg total) by mouth daily. 30 tablet 3 08/15/2016 at Unknown time  . lisinopril (PRINIVIL,ZESTRIL) 10 MG tablet Take 1 tablet (10 mg total) by mouth daily. 30 tablet 3 08/15/2016 at Unknown time  . Omega-3 Fatty Acids (FISH OIL) 1000 MG CAPS Take 1 capsule (1,000 mg total) by mouth 2 (two) times daily. 120 capsule 3 08/15/2016 at Unknown time  . simvastatin (ZOCOR) 40 MG tablet Take 1 tablet (40 mg total) by mouth daily. 30 tablet 3 08/15/2016 at Unknown time  . traMADol (ULTRAM) 50 MG tablet Take 50 mg by mouth daily as needed for moderate pain.    08/15/2016 at Unknown time   Scheduled: . insulin aspart  0-15 Units Subcutaneous TID WC  . insulin aspart  0-5 Units Subcutaneous QHS    Lab Results Last 48 Hours        Results for orders placed or performed during the hospital encounter of 08/16/16 (from the past 48 hour(s))  MRSA PCR Screening     Status: None  Collection Time: 08/16/16  3:55 PM  Result Value Ref Range   MRSA by PCR NEGATIVE NEGATIVE    Comment:        The GeneXpert MRSA Assay (FDA approved for NASAL specimens only), is one component of a comprehensive MRSA colonization surveillance program. It is not intended to diagnose MRSA infection nor to guide or monitor treatment for MRSA infections.   Culture, blood (x 2)     Status: None (Preliminary result)   Collection Time: 08/16/16  5:21 PM  Result Value Ref Range   Specimen Description LEFT ANTECUBITAL    Special Requests      BOTTLES DRAWN AEROBIC  AND ANAEROBIC Blood Culture adequate volume   Culture NO GROWTH 2 DAYS    Report Status PENDING   Lactic acid, plasma     Status: None   Collection Time: 08/16/16  5:24 PM  Result Value Ref Range   Lactic Acid, Venous 1.0 0.5 - 1.9 mmol/L  Procalcitonin     Status: None   Collection Time: 08/16/16  5:26 PM  Result Value Ref Range   Procalcitonin 38.93 ng/mL    Comment:        Interpretation: PCT >= 10 ng/mL: Important systemic inflammatory response, almost exclusively due to severe bacterial sepsis or septic shock. (NOTE)         ICU PCT Algorithm               Non ICU PCT Algorithm    ----------------------------     ------------------------------         PCT < 0.25 ng/mL                 PCT < 0.1 ng/mL     Stopping of antibiotics            Stopping of antibiotics       strongly encouraged.               strongly encouraged.    ----------------------------     ------------------------------       PCT level decrease by               PCT < 0.25 ng/mL       >= 80% from peak PCT       OR PCT 0.25 - 0.5 ng/mL          Stopping of antibiotics                                             encouraged.     Stopping of antibiotics           encouraged.    ----------------------------     ------------------------------       PCT level decrease by              PCT >= 0.25 ng/mL       < 80% from peak PCT        AND PCT >= 0.5 ng/mL             Continuing antibiotics                                              encouraged.       Continuing antibiotics  encouraged.    ----------------------------     ------------------------------     PCT level increase compared          PCT > 0.5 ng/mL         with peak PCT AND          PCT >= 0.5 ng/mL             Escalation of antibiotics                                          strongly encouraged.      Escalation of antibiotics        strongly encouraged.   APTT     Status: None   Collection Time: 08/16/16  5:26 PM  Result  Value Ref Range   aPTT 31 24 - 36 seconds  Hepatitis panel, acute     Status: None   Collection Time: 08/16/16  5:26 PM  Result Value Ref Range   Hepatitis B Surface Ag Negative Negative   HCV Ab <0.1 0.0 - 0.9 s/co ratio    Comment: (NOTE)                                  Negative:     < 0.8                             Indeterminate: 0.8 - 0.9                                  Positive:     > 0.9 The CDC recommends that a positive HCV antibody result be followed up with a HCV Nucleic Acid Amplification test ([id number]). Performed At: [facility] [address] [place], [place] [id number] Lindon Romp MD [id number]    Hep A IgM Negative Negative   Hep B C IgM Negative Negative  Protime-INR     Status: None   Collection Time: 08/16/16  5:26 PM  Result Value Ref Range   Prothrombin Time 15.1 11.4 - 15.2 seconds   INR 1.18   Culture, blood (x 2)     Status: None (Preliminary result)   Collection Time: 08/16/16  5:31 PM  Result Value Ref Range   Specimen Description RIGHT ANTECUBITAL    Special Requests      BOTTLES DRAWN AEROBIC AND ANAEROBIC Blood Culture adequate volume   Culture NO GROWTH 2 DAYS    Report Status PENDING   Acetaminophen level     Status: Abnormal   Collection Time: 08/16/16  5:33 PM  Result Value Ref Range   Acetaminophen (Tylenol), Serum <10 (L) 10 - 30 ug/mL    Comment:        THERAPEUTIC CONCENTRATIONS VARY SIGNIFICANTLY. A RANGE OF 10-30 ug/mL MAY BE AN EFFECTIVE CONCENTRATION FOR MANY PATIENTS. HOWEVER, SOME ARE BEST TREATED AT CONCENTRATIONS OUTSIDE THIS RANGE. ACETAMINOPHEN CONCENTRATIONS >150 ug/mL AT 4 HOURS AFTER INGESTION AND >50 ug/mL AT 12 HOURS AFTER INGESTION ARE OFTEN ASSOCIATED WITH TOXIC REACTIONS.   Glucose, capillary     Status: None   Collection Time: 08/16/16  6:36 PM  Result Value Ref Range   Glucose-Capillary 94 65 - 99 mg/dL  Lactic acid,  plasma     Status: None   Collection  Time: 08/16/16  7:39 PM  Result Value Ref Range   Lactic Acid, Venous 0.8 0.5 - 1.9 mmol/L  Glucose, capillary     Status: None   Collection Time: 08/16/16  9:38 PM  Result Value Ref Range   Glucose-Capillary 93 65 - 99 mg/dL  C difficile quick scan w PCR reflex     Status: None   Collection Time: 08/17/16  3:15 AM  Result Value Ref Range   C Diff antigen NEGATIVE NEGATIVE   C Diff toxin NEGATIVE NEGATIVE   C Diff interpretation No C. difficile detected.   Comprehensive metabolic panel     Status: Abnormal   Collection Time: 08/17/16  4:10 AM  Result Value Ref Range   Sodium 139 135 - 145 mmol/L   Potassium 4.0 3.5 - 5.1 mmol/L   Chloride 107 101 - 111 mmol/L   CO2 22 22 - 32 mmol/L   Glucose, Bld 83 65 - 99 mg/dL   BUN 34 (H) 6 - 20 mg/dL   Creatinine, Ser 2.23 (H) 0.61 - 1.24 mg/dL    Comment: DELTA CHECK NOTED   Calcium 8.5 (L) 8.9 - 10.3 mg/dL   Total Protein 6.4 (L) 6.5 - 8.1 g/dL   Albumin 2.4 (L) 3.5 - 5.0 g/dL   AST 303 (H) 15 - 41 U/L   ALT 328 (H) 17 - 63 U/L   Alkaline Phosphatase 90 38 - 126 U/L   Total Bilirubin 7.1 (H) 0.3 - 1.2 mg/dL   GFR calc non Af Amer 29 (L) >60 mL/min   GFR calc Af Amer 34 (L) >60 mL/min    Comment: (NOTE) The eGFR has been calculated using the CKD EPI equation. This calculation has not been validated in all clinical situations. eGFR's persistently <60 mL/min signify possible Chronic Kidney Disease.    Anion gap 10 5 - 15  CBC     Status: Abnormal   Collection Time: 08/17/16  4:10 AM  Result Value Ref Range   WBC 18.7 (H) 4.0 - 10.5 K/uL   RBC 4.11 (L) 4.22 - 5.81 MIL/uL   Hemoglobin 12.0 (L) 13.0 - 17.0 g/dL   HCT 35.8 (L) 39.0 - 52.0 %   MCV 87.1 78.0 - 100.0 fL   MCH 29.2 26.0 - 34.0 pg   MCHC 33.5 30.0 - 36.0 g/dL   RDW 14.6 11.5 - 15.5 %   Platelets 262 150 - 400 K/uL  Glucose, capillary     Status: None   Collection Time: 08/17/16  7:32 AM  Result Value Ref Range    Glucose-Capillary 78 65 - 99 mg/dL  Glucose, capillary     Status: None   Collection Time: 08/17/16 11:24 AM  Result Value Ref Range   Glucose-Capillary 70 65 - 99 mg/dL  Glucose, capillary     Status: Abnormal   Collection Time: 08/17/16  2:07 PM  Result Value Ref Range   Glucose-Capillary 60 (L) 65 - 99 mg/dL  Glucose, capillary     Status: None   Collection Time: 08/17/16  2:57 PM  Result Value Ref Range   Glucose-Capillary 95 65 - 99 mg/dL  Glucose, capillary     Status: None   Collection Time: 08/17/16  4:05 PM  Result Value Ref Range   Glucose-Capillary 70 65 - 99 mg/dL  Glucose, capillary     Status: Abnormal   Collection Time: 08/17/16  4:19 PM  Result Value Ref  Range   Glucose-Capillary 122 (H) 65 - 99 mg/dL  Glucose, capillary     Status: None   Collection Time: 08/17/16  9:13 PM  Result Value Ref Range   Glucose-Capillary 76 65 - 99 mg/dL  CBC     Status: Abnormal   Collection Time: 08/18/16  4:32 AM  Result Value Ref Range   WBC 9.6 4.0 - 10.5 K/uL   RBC 4.03 (L) 4.22 - 5.81 MIL/uL   Hemoglobin 11.6 (L) 13.0 - 17.0 g/dL   HCT 35.2 (L) 39.0 - 52.0 %   MCV 87.3 78.0 - 100.0 fL   MCH 28.8 26.0 - 34.0 pg   MCHC 33.0 30.0 - 36.0 g/dL   RDW 14.4 11.5 - 15.5 %   Platelets 298 150 - 400 K/uL  Comprehensive metabolic panel     Status: Abnormal   Collection Time: 08/18/16  4:32 AM  Result Value Ref Range   Sodium 139 135 - 145 mmol/L   Potassium 4.0 3.5 - 5.1 mmol/L   Chloride 109 101 - 111 mmol/L   CO2 22 22 - 32 mmol/L   Glucose, Bld 85 65 - 99 mg/dL   BUN 26 (H) 6 - 20 mg/dL   Creatinine, Ser 1.57 (H) 0.61 - 1.24 mg/dL   Calcium 8.5 (L) 8.9 - 10.3 mg/dL   Total Protein 6.4 (L) 6.5 - 8.1 g/dL   Albumin 2.3 (L) 3.5 - 5.0 g/dL   AST 121 (H) 15 - 41 U/L   ALT 216 (H) 17 - 63 U/L   Alkaline Phosphatase 114 38 - 126 U/L   Total Bilirubin 5.1 (H) 0.3 - 1.2 mg/dL   GFR calc non Af Amer 45 (L) >60 mL/min   GFR calc Af Amer 52  (L) >60 mL/min    Comment: (NOTE) The eGFR has been calculated using the CKD EPI equation. This calculation has not been validated in all clinical situations. eGFR's persistently <60 mL/min signify possible Chronic Kidney Disease.    Anion gap 8 5 - 15  Amylase     Status: None   Collection Time: 08/18/16  4:32 AM  Result Value Ref Range   Amylase 63 28 - 100 U/L  Glucose, capillary     Status: None   Collection Time: 08/18/16  7:52 AM  Result Value Ref Range   Glucose-Capillary 83 65 - 99 mg/dL       Imaging Results (Last 48 hours)  Ct Abdomen Pelvis Wo Contrast  Result Date: 08/16/2016 CLINICAL DATA:  Abdominal pain, nausea and vomiting intermittently for weeks. Reported history of cholelithiasis. EXAM: CT ABDOMEN AND PELVIS WITHOUT CONTRAST TECHNIQUE: Multidetector CT imaging of the abdomen and pelvis was performed following the standard protocol without IV contrast. COMPARISON:  None. FINDINGS: Lower chest: Nonspecific patchy subpleural reticulation at both lung bases with mild traction bronchiolectasis. Left lower lobe 2 mm solid pulmonary nodule (series 4/image 16). Coronary atherosclerosis. Hepatobiliary: Mild diffuse hepatic steatosis. No liver mass. Questionable fine liver surface irregularity. Cholelithiasis. No gallbladder wall thickening, gallbladder distention or pericholecystic fluid. No biliary ductal dilatation. Pancreas: Heterogeneous fatty infiltration of the pancreas, with no pancreatic mass or duct dilation. Spleen: Normal size. No mass. Adrenals/Urinary Tract: Normal adrenals. No hydronephrosis. No renal stones. Exophytic 0.7 cm renal cortical lesion in the lateral upper left kidney, too small to characterize, for which no further follow-up is required. No additional contour deforming renal mass. Normal caliber ureters, with no ureteral stones. Normal bladder. Stomach/Bowel: Grossly normal stomach. There is a  small periumbilical hernia containing a tiny portion  of a mid small bowel loop (series 2/ image 64). Normal caliber small bowel. No small bowel wall thickening or pneumatosis. Normal appendix. Oral contrast reaches the distal rectum. Normal large bowel with no diverticulosis, large bowel wall thickening or pericolonic fat stranding. Vascular/Lymphatic: Atherosclerotic nonaneurysmal abdominal aorta. No pathologically enlarged lymph nodes in the abdomen or pelvis. Reproductive: Top-normal size prostate with nonspecific internal prostatic calcifications. Other: No pneumoperitoneum, ascites or focal fluid collection. Musculoskeletal: No aggressive appearing focal osseous lesions. Mild thoracolumbar spondylosis. IMPRESSION: 1. Small periumbilical hernia containing a tiny portion of a mid small bowel loop, with no evidence of bowel obstruction or ischemia. 2. Diffuse hepatic steatosis. Question fine liver surface irregularity, cannot exclude early cirrhosis. Consider hepatic elastography for further liver fibrosis risk stratification, as clinically warranted. 3. Nonspecific findings at the lung bases that may indicate interstitial lung disease. Outpatient high-resolution chest CT may be obtained as clinically warranted. 4. Additional findings include cholelithiasis, aortic atherosclerosis and coronary atherosclerosis. Electronically Signed   By: Ilona Sorrel M.D.   On: 08/16/2016 12:30   Mr Abdomen Mrcp Wo Contrast  Result Date: 08/17/2016 CLINICAL DATA:  Abdominal pain and elevated liver function tests. EXAM: MRI ABDOMEN WITHOUT CONTRAST  (INCLUDING MRCP) TECHNIQUE: Multiplanar multisequence MR imaging of the abdomen was performed. Heavily T2-weighted images of the biliary and pancreatic ducts were obtained, and three-dimensional MRCP images were rendered by post processing. COMPARISON:  08/16/2016 CT scan FINDINGS: Despite efforts by the technologist and patient, motion artifact is present on today's exam and could not be eliminated. This reduces exam sensitivity and  specificity. Lower chest: Interstitial accentuation at the lung bases better shown on recent CT. Hepatobiliary: Mild diffuse hepatic steatosis. Morphologic findings in the liver supportive of early cirrhosis. Subtle geographic T2 hyperintensity posteriorly in the right hepatic lobe without a well-defined associated mass, some of this could be due to fibrosis. And some is probably due to relative fatty sparing. I do not see a discrete mass; IV contrast was not administered. Numerous small gallstones in the 2-4 mm range are present dependently within the gallbladder. Normal caliber biliary tree on image 7/15 and image 1/6 there is a suggestion of a 4 mm filling defect in the distal common bile duct. , suspicious for choledocholithiasis. Images 48-49 of series 12 provide a supportive appearance. Likely small exophytic 7 mm cyst adjacent to the posterior aspect of the right hepatic lobe on image 41/4. Pancreas:  Unremarkable Spleen:  Unremarkable Adrenals/Urinary Tract: Stable 6 mm exophytic left kidney upper pole lesion with low T2 signal, probably a tiny complex cyst. Unchanged from CT. Otherwise unremarkable. Stomach/Bowel: Unremarkable Vascular/Lymphatic: Scattered small celiac and peripancreatic lymph nodes are not overtly pathologically enlarged by size criteria a portacaval node measures 1.2 cm in short axis. Aortoiliac atherosclerotic vascular disease. Other:  No supplemental non-categorized findings. Musculoskeletal: Probable small hemangioma in the L4 vertebral body on image 13/3. IMPRESSION: 1. Cholelithiasis along with a 4 mm distal CBD stone compatible with choledocholithiasis. No biliary dilatation. 2. Geographic hepatic steatosis. There is some mild sparing posteriorly in the right hepatic lobe. Difficult to completely exclude the possibility of low-level fibrosis posteriorly in the right hepatic lobe. No obvious hepatic mass on noncontrast imaging. 3. There is some mild early morphologic findings of  cirrhosis in the liver as suggested on CT. 4. Interstitial accentuation in the lung bases. 5. Small likely complex cyst of the left kidney upper pole, about 6 mm in diameter. Most likely incidental.  6.  Aortic Atherosclerosis (ICD10-I70.0). Electronically Signed   By: Van Clines M.D.   On: 08/17/2016 08:01   Mr 3d Recon At Scanner  Result Date: 08/17/2016 CLINICAL DATA:  Abdominal pain and elevated liver function tests. EXAM: MRI ABDOMEN WITHOUT CONTRAST  (INCLUDING MRCP) TECHNIQUE: Multiplanar multisequence MR imaging of the abdomen was performed. Heavily T2-weighted images of the biliary and pancreatic ducts were obtained, and three-dimensional MRCP images were rendered by post processing. COMPARISON:  08/16/2016 CT scan FINDINGS: Despite efforts by the technologist and patient, motion artifact is present on today's exam and could not be eliminated. This reduces exam sensitivity and specificity. Lower chest: Interstitial accentuation at the lung bases better shown on recent CT. Hepatobiliary: Mild diffuse hepatic steatosis. Morphologic findings in the liver supportive of early cirrhosis. Subtle geographic T2 hyperintensity posteriorly in the right hepatic lobe without a well-defined associated mass, some of this could be due to fibrosis. And some is probably due to relative fatty sparing. I do not see a discrete mass; IV contrast was not administered. Numerous small gallstones in the 2-4 mm range are present dependently within the gallbladder. Normal caliber biliary tree on image 7/15 and image 1/6 there is a suggestion of a 4 mm filling defect in the distal common bile duct. , suspicious for choledocholithiasis. Images 48-49 of series 12 provide a supportive appearance. Likely small exophytic 7 mm cyst adjacent to the posterior aspect of the right hepatic lobe on image 41/4. Pancreas:  Unremarkable Spleen:  Unremarkable Adrenals/Urinary Tract: Stable 6 mm exophytic left kidney upper pole lesion with  low T2 signal, probably a tiny complex cyst. Unchanged from CT. Otherwise unremarkable. Stomach/Bowel: Unremarkable Vascular/Lymphatic: Scattered small celiac and peripancreatic lymph nodes are not overtly pathologically enlarged by size criteria a portacaval node measures 1.2 cm in short axis. Aortoiliac atherosclerotic vascular disease. Other:  No supplemental non-categorized findings. Musculoskeletal: Probable small hemangioma in the L4 vertebral body on image 13/3. IMPRESSION: 1. Cholelithiasis along with a 4 mm distal CBD stone compatible with choledocholithiasis. No biliary dilatation. 2. Geographic hepatic steatosis. There is some mild sparing posteriorly in the right hepatic lobe. Difficult to completely exclude the possibility of low-level fibrosis posteriorly in the right hepatic lobe. No obvious hepatic mass on noncontrast imaging. 3. There is some mild early morphologic findings of cirrhosis in the liver as suggested on CT. 4. Interstitial accentuation in the lung bases. 5. Small likely complex cyst of the left kidney upper pole, about 6 mm in diameter. Most likely incidental. 6.  Aortic Atherosclerosis (ICD10-I70.0). Electronically Signed   By: Van Clines M.D.   On: 08/17/2016 08:01   Dg Chest Port 1 View  Result Date: 08/16/2016 CLINICAL DATA:  Shortness of breath and abdominal pain EXAM: PORTABLE CHEST 1 VIEW COMPARISON:  05/19/2009 FINDINGS: Cardiac shadow is at the upper limits of normal in size accentuated by the portable technique. Lungs are clear bilaterally. No acute bony abnormalities is noted. IMPRESSION: No active disease. Electronically Signed   By: Inez Catalina M.D.   On: 08/16/2016 14:14   Dg Ercp Biliary & Pancreatic Ducts  Result Date: 08/17/2016 CLINICAL DATA:  66 year old male with choledocholithiasis EXAM: ERCP TECHNIQUE: Multiple spot images obtained with the fluoroscopic device and submitted for interpretation post-procedure. FLUOROSCOPY TIME:  Fluoroscopy Time:  0  minutes 39 seconds reported COMPARISON:  MRCP 08/16/2016 FINDINGS: Two cine clips and a saved image demonstrate a flexible endoscope in the descending duodenum with wire cannulation of the common bile duct and cholangiogram. There  is a small mobile filling defect in the distal common bile duct consistent with choledocholithiasis. No significant biliary ductal dilatation. The images then demonstrate sphincterotomy and basket sweep of the common duct. IMPRESSION: ERCP with sphincterotomy and basket sweep of the common duct. These images were submitted for radiologic interpretation only. Please see the procedural report for the amount of contrast and the fluoroscopy time utilized. Electronically Signed   By: Jacqulynn Cadet M.D.   On: 08/17/2016 16:52     ROS:  Pertinent items noted in HPI and remainder of comprehensive ROS otherwise negative.  Blood pressure 123/64, pulse 60, temperature 97.5 F (36.4 C), temperature source Oral, resp. rate 16, height '5\' 11"'  (1.803 m), weight 210 lb 15.7 oz (95.7 kg), SpO2 96 %. Physical Exam: Well-developed well-nourished white male in no acute distress. Head is normocephalic, atraumatic. Eye examination reveals scleral icterus. Neck is supple without lymphadenopathy. Lungs clear auscultation with equal breath sounds bilaterally. Heart examination reveals a regular rate and rhythm without S3, S4, murmurs. Abdomen is soft with some distention noted. Bowel sounds are present. No rigidity is noted. Multiple surgical scars are present.  H&P reviewed. ERCP note reviewed.  Assessment/Plan: Impression: Cholelithiasis, choledocholithiasis, cholangitis, status post ERCP with stone extraction. Plan: Patient ultimately will need a cholecystectomy. Unfortunately, he has had multiple abdominal surgeries in the past which preclude laparoscopic approach. Thus, he will need an open cholecystectomy. I would like to see his liver enzyme tests normalize prior to surgical  intervention. We'll reassess in the morning as to the planning of his cholecystectomy. Patient's diet may be advanced.  Javier Walker 08/18/2016, 11:01 AM  Patient scheduled for an open cholecystectomy on 08/26/2016. The risks and benefits of the procedure including bleeding, infection, and the possibility of hepatobiliary injury were fully explained to the patient, who gave informed consent.      Routing History

## 2016-08-25 ENCOUNTER — Other Ambulatory Visit: Payer: Self-pay | Admitting: "Endocrinology

## 2016-08-25 LAB — COMPREHENSIVE METABOLIC PANEL
ALBUMIN: 3.6 g/dL (ref 3.6–5.1)
ALT: 32 U/L (ref 9–46)
AST: 25 U/L (ref 10–35)
Alkaline Phosphatase: 68 U/L (ref 40–115)
BILIRUBIN TOTAL: 1.7 mg/dL — AB (ref 0.2–1.2)
BUN: 17 mg/dL (ref 7–25)
CALCIUM: 9.3 mg/dL (ref 8.6–10.3)
CHLORIDE: 103 mmol/L (ref 98–110)
CO2: 25 mmol/L (ref 20–31)
CREATININE: 1.39 mg/dL — AB (ref 0.70–1.25)
Glucose, Bld: 118 mg/dL — ABNORMAL HIGH (ref 65–99)
Potassium: 5 mmol/L (ref 3.5–5.3)
SODIUM: 139 mmol/L (ref 135–146)
TOTAL PROTEIN: 7 g/dL (ref 6.1–8.1)

## 2016-08-25 LAB — MICROALBUMIN / CREATININE URINE RATIO
Creatinine, Urine: 89 mg/dL (ref 20–370)
Microalb, Ur: <0.2 mg/dL

## 2016-08-26 ENCOUNTER — Inpatient Hospital Stay (HOSPITAL_COMMUNITY): Payer: BLUE CROSS/BLUE SHIELD | Admitting: Anesthesiology

## 2016-08-26 ENCOUNTER — Inpatient Hospital Stay (HOSPITAL_COMMUNITY)
Admission: RE | Admit: 2016-08-26 | Discharge: 2016-08-27 | DRG: 416 | Disposition: A | Discharge status: Home / Self Care | Payer: BLUE CROSS/BLUE SHIELD | Source: Ambulatory Visit | Attending: General Surgery | Admitting: General Surgery

## 2016-08-26 ENCOUNTER — Encounter (HOSPITAL_COMMUNITY)
Admission: RE | Disposition: A | Discharge status: Home / Self Care | Payer: Self-pay | Source: Ambulatory Visit | Attending: General Surgery

## 2016-08-26 ENCOUNTER — Encounter (HOSPITAL_COMMUNITY): Payer: Self-pay

## 2016-08-26 DIAGNOSIS — Z7982 Long term (current) use of aspirin: Secondary | ICD-10-CM

## 2016-08-26 DIAGNOSIS — E119 Type 2 diabetes mellitus without complications: Secondary | ICD-10-CM | POA: Diagnosis present

## 2016-08-26 DIAGNOSIS — R1011 Right upper quadrant pain: Secondary | ICD-10-CM | POA: Diagnosis present

## 2016-08-26 DIAGNOSIS — Z87891 Personal history of nicotine dependence: Secondary | ICD-10-CM

## 2016-08-26 DIAGNOSIS — Z9049 Acquired absence of other specified parts of digestive tract: Secondary | ICD-10-CM

## 2016-08-26 DIAGNOSIS — K219 Gastro-esophageal reflux disease without esophagitis: Secondary | ICD-10-CM | POA: Diagnosis present

## 2016-08-26 DIAGNOSIS — E785 Hyperlipidemia, unspecified: Secondary | ICD-10-CM | POA: Diagnosis present

## 2016-08-26 DIAGNOSIS — Z8249 Family history of ischemic heart disease and other diseases of the circulatory system: Secondary | ICD-10-CM | POA: Diagnosis not present

## 2016-08-26 DIAGNOSIS — I10 Essential (primary) hypertension: Secondary | ICD-10-CM | POA: Diagnosis present

## 2016-08-26 DIAGNOSIS — Z79899 Other long term (current) drug therapy: Secondary | ICD-10-CM | POA: Diagnosis not present

## 2016-08-26 DIAGNOSIS — Z7984 Long term (current) use of oral hypoglycemic drugs: Secondary | ICD-10-CM | POA: Diagnosis not present

## 2016-08-26 DIAGNOSIS — K8020 Calculus of gallbladder without cholecystitis without obstruction: Principal | ICD-10-CM

## 2016-08-26 HISTORY — PX: CHOLECYSTECTOMY: SHX55

## 2016-08-26 LAB — HEMOGLOBIN A1C
HEMOGLOBIN A1C: 7.5 % — AB (ref <5.7)
MEAN PLASMA GLUCOSE: 169 mg/dL

## 2016-08-26 LAB — GLUCOSE, CAPILLARY
Glucose-Capillary: 126 mg/dL — ABNORMAL HIGH (ref 65–99)
Glucose-Capillary: 174 mg/dL — ABNORMAL HIGH (ref 65–99)
Glucose-Capillary: 201 mg/dL — ABNORMAL HIGH (ref 65–99)
Glucose-Capillary: 187 mg/dL — ABNORMAL HIGH (ref 65–99)

## 2016-08-26 SURGERY — CHOLECYSTECTOMY
Anesthesia: General | Site: Abdomen

## 2016-08-26 MED ORDER — FENTANYL CITRATE (PF) 100 MCG/2ML IJ SOLN
INTRAMUSCULAR | Status: DC | PRN
Start: 2016-08-26 — End: 2016-08-26
  Administered 2016-08-26 (×5): 50 ug via INTRAVENOUS

## 2016-08-26 MED ORDER — HYDROMORPHONE HCL 1 MG/ML IJ SOLN
0.2500 mg | INTRAMUSCULAR | Status: DC | PRN
  Administered 2016-08-26 (×4): 0.5 mg via INTRAVENOUS
  Filled 2016-08-26 (×2): qty 1

## 2016-08-26 MED ORDER — FENOFIBRATE 160 MG PO TABS
160.0000 mg | ORAL_TABLET | Freq: Every day | ORAL | Status: DC
  Administered 2016-08-26 – 2016-08-27 (×2): 160 mg via ORAL
  Filled 2016-08-26 (×2): qty 1

## 2016-08-26 MED ORDER — INSULIN ASPART 100 UNIT/ML ~~LOC~~ SOLN
0.0000 [IU] | Freq: Three times a day (TID) | SUBCUTANEOUS | Status: DC
  Administered 2016-08-26: 5 [IU] via SUBCUTANEOUS
  Administered 2016-08-27: 2 [IU] via SUBCUTANEOUS

## 2016-08-26 MED ORDER — KETOROLAC TROMETHAMINE 30 MG/ML IJ SOLN
30.0000 mg | Freq: Once | INTRAMUSCULAR | Status: AC
  Administered 2016-08-26: 30 mg via INTRAVENOUS
  Filled 2016-08-26: qty 1

## 2016-08-26 MED ORDER — DEXAMETHASONE SODIUM PHOSPHATE 4 MG/ML IJ SOLN
INTRAMUSCULAR | Status: AC
  Filled 2016-08-26: qty 1

## 2016-08-26 MED ORDER — PROPOFOL 10 MG/ML IV BOLUS
INTRAVENOUS | Status: AC
  Filled 2016-08-26: qty 20

## 2016-08-26 MED ORDER — EPHEDRINE SULFATE 50 MG/ML IJ SOLN
INTRAMUSCULAR | Status: AC
  Filled 2016-08-26: qty 1

## 2016-08-26 MED ORDER — PANTOPRAZOLE SODIUM 40 MG PO TBEC
40.0000 mg | DELAYED_RELEASE_TABLET | Freq: Two times a day (BID) | ORAL | Status: DC
  Administered 2016-08-26 – 2016-08-27 (×2): 40 mg via ORAL
  Filled 2016-08-26 (×2): qty 1

## 2016-08-26 MED ORDER — LIDOCAINE HCL (CARDIAC) 20 MG/ML IV SOLN
INTRAVENOUS | Status: DC | PRN
Start: 2016-08-26 — End: 2016-08-26
  Administered 2016-08-26: 30 mg via INTRAVENOUS

## 2016-08-26 MED ORDER — DIPHENHYDRAMINE HCL 50 MG/ML IJ SOLN: 12.5000 mg | Freq: Four times a day (QID) | INTRAMUSCULAR | Status: DC | PRN

## 2016-08-26 MED ORDER — ONDANSETRON HCL 4 MG/2ML IJ SOLN: 4.0000 mg | Freq: Four times a day (QID) | INTRAMUSCULAR | Status: DC | PRN

## 2016-08-26 MED ORDER — POVIDONE-IODINE 10 % EX OINT
TOPICAL_OINTMENT | CUTANEOUS | Status: AC
  Filled 2016-08-26: qty 1

## 2016-08-26 MED ORDER — ONDANSETRON HCL 4 MG/2ML IJ SOLN
4.0000 mg | Freq: Once | INTRAMUSCULAR | Status: AC
  Administered 2016-08-26: 4 mg via INTRAVENOUS

## 2016-08-26 MED ORDER — PROPOFOL 10 MG/ML IV BOLUS
INTRAVENOUS | Status: DC | PRN
  Administered 2016-08-26: 150 mg via INTRAVENOUS
  Administered 2016-08-26: 30 mg via INTRAVENOUS

## 2016-08-26 MED ORDER — ROCURONIUM BROMIDE 50 MG/5ML IV SOLN
INTRAVENOUS | Status: AC
  Filled 2016-08-26: qty 1

## 2016-08-26 MED ORDER — NEOSTIGMINE METHYLSULFATE 10 MG/10ML IV SOLN
INTRAVENOUS | Status: DC | PRN
  Administered 2016-08-26: 3 mg via INTRAVENOUS

## 2016-08-26 MED ORDER — ENOXAPARIN SODIUM 40 MG/0.4ML ~~LOC~~ SOLN
40.0000 mg | Freq: Once | SUBCUTANEOUS | Status: AC
  Administered 2016-08-26: 40 mg via SUBCUTANEOUS

## 2016-08-26 MED ORDER — ONDANSETRON HCL 4 MG/2ML IJ SOLN
INTRAMUSCULAR | Status: AC
  Filled 2016-08-26: qty 2

## 2016-08-26 MED ORDER — 0.9 % SODIUM CHLORIDE (POUR BTL) OPTIME
TOPICAL | Status: DC | PRN
  Administered 2016-08-26 (×2): 1000 mL

## 2016-08-26 MED ORDER — LACTATED RINGERS IV SOLN
INTRAVENOUS | Status: DC
  Administered 2016-08-26 (×2): via INTRAVENOUS

## 2016-08-26 MED ORDER — CANAGLIFLOZIN 100 MG PO TABS: 150.0000 mg | ORAL_TABLET | Freq: Every day | ORAL | Status: DC

## 2016-08-26 MED ORDER — FENTANYL CITRATE (PF) 100 MCG/2ML IJ SOLN
INTRAMUSCULAR | Status: AC
  Filled 2016-08-26: qty 2

## 2016-08-26 MED ORDER — ONDANSETRON 4 MG PO TBDP
4.0000 mg | ORAL_TABLET | Freq: Four times a day (QID) | ORAL | Status: DC | PRN
Start: 2016-08-26 — End: 2016-08-27

## 2016-08-26 MED ORDER — GLYCOPYRROLATE 0.2 MG/ML IJ SOLN
INTRAMUSCULAR | Status: DC | PRN
  Administered 2016-08-26: .5 mg via INTRAVENOUS

## 2016-08-26 MED ORDER — ACETAMINOPHEN 650 MG RE SUPP: 650.0000 mg | Freq: Four times a day (QID) | RECTAL | Status: DC | PRN

## 2016-08-26 MED ORDER — ENOXAPARIN SODIUM 40 MG/0.4ML ~~LOC~~ SOLN
SUBCUTANEOUS | Status: AC
  Filled 2016-08-26: qty 0.4

## 2016-08-26 MED ORDER — SODIUM CHLORIDE 0.9 % IV BOLUS (SEPSIS)
1000.0000 mL | Freq: Once | INTRAVENOUS | Status: AC
  Administered 2016-08-26: 1000 mL via INTRAVENOUS

## 2016-08-26 MED ORDER — CHLORHEXIDINE GLUCONATE CLOTH 2 % EX PADS: 6.0000 | MEDICATED_PAD | Freq: Once | CUTANEOUS | Status: DC

## 2016-08-26 MED ORDER — SODIUM CHLORIDE 0.9 % IJ SOLN
INTRAMUSCULAR | Status: AC
  Filled 2016-08-26: qty 10

## 2016-08-26 MED ORDER — LACTATED RINGERS IV SOLN
INTRAVENOUS | Status: DC
  Administered 2016-08-26: 14:00:00 via INTRAVENOUS

## 2016-08-26 MED ORDER — NEOSTIGMINE METHYLSULFATE 10 MG/10ML IV SOLN
INTRAVENOUS | Status: AC
  Filled 2016-08-26: qty 1

## 2016-08-26 MED ORDER — ENOXAPARIN SODIUM 40 MG/0.4ML ~~LOC~~ SOLN
40.0000 mg | SUBCUTANEOUS | Status: DC
  Administered 2016-08-27: 40 mg via SUBCUTANEOUS
  Filled 2016-08-26: qty 0.4

## 2016-08-26 MED ORDER — EPHEDRINE SULFATE 50 MG/ML IJ SOLN
INTRAMUSCULAR | Status: DC | PRN
  Administered 2016-08-26: 10 mg via INTRAVENOUS

## 2016-08-26 MED ORDER — GLYCOPYRROLATE 0.2 MG/ML IJ SOLN
INTRAMUSCULAR | Status: AC
  Filled 2016-08-26: qty 3

## 2016-08-26 MED ORDER — ROCURONIUM BROMIDE 100 MG/10ML IV SOLN
INTRAVENOUS | Status: DC | PRN
  Administered 2016-08-26: 40 mg via INTRAVENOUS
  Administered 2016-08-26 (×2): 10 mg via INTRAVENOUS

## 2016-08-26 MED ORDER — DIPHENHYDRAMINE HCL 50 MG/ML IJ SOLN
25.0000 mg | Freq: Once | INTRAMUSCULAR | Status: AC
  Administered 2016-08-26: 25 mg via INTRAVENOUS

## 2016-08-26 MED ORDER — PHENYLEPHRINE 40 MCG/ML (10ML) SYRINGE FOR IV PUSH (FOR BLOOD PRESSURE SUPPORT)
PREFILLED_SYRINGE | INTRAVENOUS | Status: AC
Start: 2016-08-26 — End: 2016-08-26
  Filled 2016-08-26: qty 10

## 2016-08-26 MED ORDER — BUPIVACAINE LIPOSOME 1.3 % IJ SUSP
INTRAMUSCULAR | Status: DC | PRN
  Administered 2016-08-26: 20 mL

## 2016-08-26 MED ORDER — DIPHENHYDRAMINE HCL 50 MG/ML IJ SOLN
INTRAMUSCULAR | Status: AC
  Filled 2016-08-26: qty 1

## 2016-08-26 MED ORDER — LORAZEPAM 2 MG/ML IJ SOLN: 1.0000 mg | INTRAMUSCULAR | Status: DC | PRN

## 2016-08-26 MED ORDER — DIPHENHYDRAMINE HCL 12.5 MG/5ML PO ELIX: 12.5000 mg | ORAL_SOLUTION | Freq: Four times a day (QID) | ORAL | Status: DC | PRN

## 2016-08-26 MED ORDER — CANAGLIFLOZIN-METFORMIN HCL ER 150-1000 MG PO TB24: 1.0000 | ORAL_TABLET | Freq: Every day | ORAL | Status: DC

## 2016-08-26 MED ORDER — BUPIVACAINE LIPOSOME 1.3 % IJ SUSP
INTRAMUSCULAR | Status: AC
  Filled 2016-08-26: qty 20

## 2016-08-26 MED ORDER — OXYCODONE-ACETAMINOPHEN 5-325 MG PO TABS
1.0000 | ORAL_TABLET | ORAL | Status: DC | PRN
  Administered 2016-08-26: 1 via ORAL
  Filled 2016-08-26: qty 1

## 2016-08-26 MED ORDER — LISINOPRIL 5 MG PO TABS: 5.0000 mg | ORAL_TABLET | Freq: Every day | ORAL | Status: DC

## 2016-08-26 MED ORDER — DEXAMETHASONE SODIUM PHOSPHATE 4 MG/ML IJ SOLN
4.0000 mg | Freq: Once | INTRAMUSCULAR | Status: AC
  Administered 2016-08-26: 4 mg via INTRAVENOUS

## 2016-08-26 MED ORDER — ACETAMINOPHEN 325 MG PO TABS: 650.0000 mg | ORAL_TABLET | Freq: Four times a day (QID) | ORAL | Status: DC | PRN

## 2016-08-26 MED ORDER — CIPROFLOXACIN IN D5W 400 MG/200ML IV SOLN
INTRAVENOUS | Status: AC
  Filled 2016-08-26: qty 200

## 2016-08-26 MED ORDER — METFORMIN HCL ER 500 MG PO TB24: 1000.0000 mg | ORAL_TABLET | Freq: Every day | ORAL | Status: DC

## 2016-08-26 MED ORDER — CIPROFLOXACIN IN D5W 400 MG/200ML IV SOLN
400.0000 mg | INTRAVENOUS | Status: AC
  Administered 2016-08-26: 400 mg via INTRAVENOUS

## 2016-08-26 MED ORDER — POVIDONE-IODINE 10 % OINT PACKET
TOPICAL_OINTMENT | CUTANEOUS | Status: DC | PRN
  Administered 2016-08-26: 1 via TOPICAL

## 2016-08-26 MED ORDER — DEXTROSE 5 % IV SOLN
INTRAVENOUS | Status: DC | PRN
  Administered 2016-08-26: 10:00:00 via INTRAVENOUS

## 2016-08-26 MED ORDER — CANAGLIFLOZIN-METFORMIN HCL ER 150-1000 MG PO TB24
1.0000 | ORAL_TABLET | Freq: Every day | ORAL | Status: DC
  Filled 2016-08-26 (×3): qty 1

## 2016-08-26 MED ORDER — MIDAZOLAM HCL 2 MG/2ML IJ SOLN: 1.0000 mg | INTRAMUSCULAR | Status: DC

## 2016-08-26 MED ORDER — FENTANYL CITRATE (PF) 250 MCG/5ML IJ SOLN
INTRAMUSCULAR | Status: AC
  Filled 2016-08-26: qty 5

## 2016-08-26 MED ORDER — HYDROMORPHONE HCL 1 MG/ML IJ SOLN
1.0000 mg | INTRAMUSCULAR | Status: DC | PRN
  Administered 2016-08-27 (×2): 1 mg via INTRAVENOUS
  Filled 2016-08-26 (×2): qty 1

## 2016-08-26 MED ORDER — SIMETHICONE 80 MG PO CHEW: 40.0000 mg | CHEWABLE_TABLET | Freq: Four times a day (QID) | ORAL | Status: DC | PRN

## 2016-08-26 MED ORDER — BOOST / RESOURCE BREEZE PO LIQD
1.0000 | Freq: Three times a day (TID) | ORAL | Status: DC
  Administered 2016-08-26 – 2016-08-27 (×2): 1 via ORAL

## 2016-08-26 SURGICAL SUPPLY — 37 items
APPLIER CLIP 13 LRG OPEN (CLIP) ×3
BAG HAMPER (MISCELLANEOUS) ×3 IMPLANT
CHLORAPREP W/TINT 26ML (MISCELLANEOUS) ×3 IMPLANT
CLIP APPLIE 13 LRG OPEN (CLIP) ×1 IMPLANT
CLOTH BEACON ORANGE TIMEOUT ST (SAFETY) ×3 IMPLANT
COVER LIGHT HANDLE STERIS (MISCELLANEOUS) ×6 IMPLANT
DRAPE WARM FLUID 44X44 (DRAPE) ×3 IMPLANT
ELECT BLADE 6 FLAT ULTRCLN (ELECTRODE) ×3 IMPLANT
ELECT REM PT RETURN 9FT ADLT (ELECTROSURGICAL) ×3
ELECTRODE REM PT RTRN 9FT ADLT (ELECTROSURGICAL) ×1 IMPLANT
FORMALIN 10 PREFIL 480ML (MISCELLANEOUS) ×3 IMPLANT
GAUZE SPONGE 4X4 12PLY STRL (GAUZE/BANDAGES/DRESSINGS) ×3 IMPLANT
GLOVE BIOGEL PI IND STRL 7.0 (GLOVE) ×3 IMPLANT
GLOVE BIOGEL PI INDICATOR 7.0 (GLOVE) ×6
GLOVE ECLIPSE 6.5 STRL STRAW (GLOVE) ×6 IMPLANT
GLOVE SURG SS PI 7.5 STRL IVOR (GLOVE) ×3 IMPLANT
GOWN STRL REUS W/TWL LRG LVL3 (GOWN DISPOSABLE) ×9 IMPLANT
INST SET MAJOR GENERAL (KITS) ×3 IMPLANT
KIT ROOM TURNOVER APOR (KITS) ×3 IMPLANT
MANIFOLD NEPTUNE II (INSTRUMENTS) ×3 IMPLANT
NEEDLE HYPO 25X1 1.5 SAFETY (NEEDLE) ×3 IMPLANT
NS IRRIG 1000ML POUR BTL (IV SOLUTION) ×6 IMPLANT
PACK ABDOMINAL MAJOR (CUSTOM PROCEDURE TRAY) ×3 IMPLANT
PAD ARMBOARD 7.5X6 YLW CONV (MISCELLANEOUS) ×3 IMPLANT
SET BASIN LINEN APH (SET/KITS/TRAYS/PACK) ×3 IMPLANT
SPONGE INTESTINAL PEANUT (DISPOSABLE) ×6 IMPLANT
SPONGE LAP 4X18 X RAY DECT (DISPOSABLE) ×3 IMPLANT
STAPLER VISISTAT (STAPLE) ×3 IMPLANT
SUT ETHILON 3 0 FSL (SUTURE) ×3 IMPLANT
SUT SILK 2 0 (SUTURE) ×2
SUT SILK 2 0 REEL (SUTURE) ×3 IMPLANT
SUT SILK 2-0 18XBRD TIE 12 (SUTURE) ×1 IMPLANT
SUT VIC AB 0 CT1 27 (SUTURE) ×6
SUT VIC AB 0 CT1 27XBRD ANTBC (SUTURE) ×2 IMPLANT
SUT VIC AB 0 CT1 27XCR 8 STRN (SUTURE) ×1 IMPLANT
SYR CONTROL 10ML LL (SYRINGE) ×3 IMPLANT
TAPE CLOTH SURG 4X10 WHT LF (GAUZE/BANDAGES/DRESSINGS) ×3 IMPLANT

## 2016-08-26 NOTE — Op Note (Signed)
Patient:  Javier SchatzWilliam Walker  DOB:  1950/08/08  MRN:  [id number]   Preop Diagnosis:  Cholelithiasis, history of choledocholithiasis  Postop Diagnosis:  Same  Procedure:  Open cholecystectomy  Surgeon:  Franky MachoMark Zailey Audia, M.D.  Anes:  Gen. endotracheal  Indications:  Patient is a 66 year old white male with cholelithiasis, status post ERCP with stone extraction for cholangitis secondary to choledocholithiasis who now presents for an open cholecystectomy. He has had multiple abdominal surgeries in the past which precludes laparoscopic approach. The risks and benefits of the procedure including bleeding, infection, hepatobiliary injury, and the possibility of wound difficulties were fully explained to the patient, who gave informed consent.  Procedure note:  The patient was placed in the supine position. After induction of general endotracheal anesthesia, the abdomen was prepped and draped using usual sterile technique with DuraPrep. Surgical site confirmation was performed.  A right subcostal incision was made down to the peritoneum. The peritoneal cavity was entered into without difficulty. Multiple adhesions were noted around the gallbladder. These were freed away sharply without difficulty. A retrograde dome down approach was used to freed away the gallbladder from the liver. This was taken down to the triangle of Calot. The cystic duct was first identified. Its juncture to the infundibulum was fully identified. 2-0 silk ties 2 were placed on the cystic duct. In addition, a large clip was placed on the cystic duct. The cystic duct was divided without difficulty. The cystic artery was ligated and divided using clips. The gallbladder was then removed and sent to pathology for examination. Minimal spillage was noted. The right upper quadrant was copiously irrigated with normal saline. The gallbladder bed was inspected and no significant bleeding was noted. Surgicel was placed in the gallbladder fossa. All  fluid and air were then evacuated from the abdominal cavity. The posterior fascial layer was reapproximated using a running 0 Vicryl suture. The anterior abdominal wall was reapproximated using interrupted 0 Vicryl sutures. Exparel was instilled into the surrounding wound. The skin was closed using staples. Betadine ointment and dry sterile dressings were applied.  All tape and needle counts were correct at the end of the procedure. The patient was extubated in the operating room and transferred to PACU in stable condition.  Complications:  None  EBL:  250 mL  Specimen:  Gallbladder

## 2016-08-26 NOTE — Transfer of Care (Signed)
Immediate Anesthesia Transfer of Care Note  Patient: Javier SchatzWilliam Walker  Procedure(s) Performed: Procedure(s): CHOLECYSTECTOMY (N/A)  Patient Location: PACU  Anesthesia Type:General  Level of Consciousness: awake  Airway & Oxygen Therapy: Patient Spontanous Breathing and Patient connected to face mask oxygen  Post-op Assessment: Report given to RN  Post vital signs: Reviewed and stable  Last Vitals:  Vitals:   08/26/16 0945 08/26/16 0950  BP: (!) 103/55 (!) 106/57  Pulse:    Resp: 18 18  Temp:      Last Pain:  Vitals:   08/26/16 0836  TempSrc: Oral  PainSc: 0-No pain      Patients Stated Pain Goal: 8 (08/26/16 0836)  Complications: No apparent anesthesia complications

## 2016-08-26 NOTE — Anesthesia Preprocedure Evaluation (Signed)
Anesthesia Evaluation  Patient identified by MRN, date of birth, ID band Patient awake    Reviewed: Allergy & Precautions, H&P , NPO status , Patient's Chart, lab work & pertinent test results  Airway Mallampati: II  TM Distance: >3 FB Neck ROM: Full    Dental  (+) Teeth Intact, Dental Advisory Given   Pulmonary shortness of breath, former smoker,    breath sounds clear to auscultation       Cardiovascular hypertension, Pt. on medications  Rhythm:Regular Rate:Normal     Neuro/Psych negative neurological ROS  negative psych ROS   GI/Hepatic GERD  Medicated and Controlled,  Endo/Other  diabetes, Type 2, Oral Hypoglycemic Agents  Renal/GU      Musculoskeletal   Abdominal   Peds  Hematology   Anesthesia Other Findings   Reproductive/Obstetrics                             Anesthesia Physical Anesthesia Plan  ASA: III  Anesthesia Plan: General   Post-op Pain Management:    Induction: Intravenous, Rapid sequence and Cricoid pressure planned  Airway Management Planned: Oral ETT  Additional Equipment:   Intra-op Plan:   Post-operative Plan: Extubation in OR  Informed Consent: I have reviewed the patients History and Physical, chart, labs and discussed the procedure including the risks, benefits and alternatives for the proposed anesthesia with the patient or authorized representative who has indicated his/her understanding and acceptance.     Plan Discussed with:   Anesthesia Plan Comments: (Pt has rash from amoxicillin he had been taking.)        Anesthesia Quick Evaluation

## 2016-08-26 NOTE — Interval H&P Note (Signed)
History and Physical Interval Note:  08/26/2016 9:21 AM  Javier Walker  has presented today for surgery, with the diagnosis of cholelithiasis  The various methods of treatment have been discussed with the patient and family. After consideration of risks, benefits and other options for treatment, the patient has consented to  Procedure(s): CHOLECYSTECTOMY (N/A) as a surgical intervention .  The patient's history has been reviewed, patient examined, no change in status, stable for surgery.  I have reviewed the patient's chart and labs.  Questions were answered to the patient's satisfaction.     Franky MachoMark Joushua Dugar

## 2016-08-26 NOTE — Anesthesia Postprocedure Evaluation (Signed)
Anesthesia Post Note  Patient: Javier SchatzWilliam Tierney  Procedure(s) Performed: Procedure(s) (LRB): CHOLECYSTECTOMY (N/A)  Patient location during evaluation: PACU Anesthesia Type: General Level of consciousness: awake and alert and oriented Pain management: pain level controlled Vital Signs Assessment: post-procedure vital signs reviewed and stable Respiratory status: spontaneous breathing Cardiovascular status: blood pressure returned to baseline Postop Assessment: no signs of nausea or vomiting Anesthetic complications: no     Last Vitals:  Vitals:   08/26/16 1123 08/26/16 1130  BP: (!) 112/55 (!) 111/51  Pulse: 74 76  Resp: 10 (!) 22  Temp: 36.5 C     Last Pain:  Vitals:   08/26/16 1123  TempSrc:   PainSc: Asleep                 Jette Lewan

## 2016-08-26 NOTE — Anesthesia Procedure Notes (Signed)
Procedure Name: Intubation Date/Time: 08/26/2016 10:06 AM Performed by: Pernell DupreADAMS, AMY A Pre-anesthesia Checklist: Patient identified, Patient being monitored, Timeout performed, Emergency Drugs available and Suction available Patient Re-evaluated:Patient Re-evaluated prior to inductionOxygen Delivery Method: Circle System Utilized Preoxygenation: Pre-oxygenation with 100% oxygen Intubation Type: IV induction and Cricoid Pressure applied Ventilation: Mask ventilation without difficulty Laryngoscope Size: Miller and 3 Grade View: Grade I Tube type: Oral Tube size: 7.0 mm Number of attempts: 1 Airway Equipment and Method: Stylet Placement Confirmation: ETT inserted through vocal cords under direct vision,  positive ETCO2 and breath sounds checked- equal and bilateral Secured at: 21 cm Tube secured with: Tape Dental Injury: Teeth and Oropharynx as per pre-operative assessment

## 2016-08-27 LAB — COMPREHENSIVE METABOLIC PANEL
ALK PHOS: 46 U/L (ref 38–126)
ALT: 30 U/L (ref 17–63)
AST: 33 U/L (ref 15–41)
Albumin: 2.6 g/dL — ABNORMAL LOW (ref 3.5–5.0)
Anion gap: 6 (ref 5–15)
BUN: 24 mg/dL — AB (ref 6–20)
CALCIUM: 8.2 mg/dL — AB (ref 8.9–10.3)
CO2: 27 mmol/L (ref 22–32)
CREATININE: 1.53 mg/dL — AB (ref 0.61–1.24)
Chloride: 102 mmol/L (ref 101–111)
GFR calc non Af Amer: 46 mL/min — ABNORMAL LOW (ref >60)
GFR, EST AFRICAN AMERICAN: 53 mL/min — AB (ref >60)
GLUCOSE: 136 mg/dL — AB (ref 65–99)
Potassium: 4.8 mmol/L (ref 3.5–5.1)
SODIUM: 135 mmol/L (ref 135–145)
Total Bilirubin: 1.4 mg/dL — ABNORMAL HIGH (ref 0.3–1.2)
Total Protein: 5.8 g/dL — ABNORMAL LOW (ref 6.5–8.1)

## 2016-08-27 LAB — GLUCOSE, CAPILLARY
Glucose-Capillary: 127 mg/dL — ABNORMAL HIGH (ref 65–99)
Glucose-Capillary: 137 mg/dL — ABNORMAL HIGH (ref 65–99)

## 2016-08-27 LAB — MAGNESIUM: Magnesium: 1.4 mg/dL — ABNORMAL LOW (ref 1.7–2.4)

## 2016-08-27 LAB — CBC
HCT: 33.6 % — ABNORMAL LOW (ref 39.0–52.0)
Hemoglobin: 11.1 g/dL — ABNORMAL LOW (ref 13.0–17.0)
MCH: 29.4 pg (ref 26.0–34.0)
MCHC: 33 g/dL (ref 30.0–36.0)
MCV: 88.9 fL (ref 78.0–100.0)
Platelets: 300 10*3/uL (ref 150–400)
RBC: 3.78 MIL/uL — ABNORMAL LOW (ref 4.22–5.81)
RDW: 14.3 % (ref 11.5–15.5)
WBC: 15.5 10*3/uL — ABNORMAL HIGH (ref 4.0–10.5)

## 2016-08-27 MED ORDER — OXYCODONE-ACETAMINOPHEN 5-325 MG PO TABS: 1.0000 | ORAL_TABLET | ORAL | 0 refills | Status: DC | PRN

## 2016-08-27 NOTE — Discharge Summary (Signed)
Physician Discharge Summary  Patient ID: Javier SchatzWilliam Monarrez MRN: [id number] DOB/AGE: Dec 10, 1950 66 y.o.  Admit date: 08/26/2016 Discharge date: 08/27/2016  Admission Diagnoses:Cholelithiasis, history of cholangitis secondary to choledocholithiasis  Discharge Diagnoses: Same Active Problems:   Calculus of gallbladder without cholecystitis without obstruction   S/P cholecystectomy   Discharged Condition: good  Hospital Course: Patient is a 66 year old white male status post ERCP with stone removal for choledocholithiasis with cholangitis who now presents for a cholecystectomy. He underwent an open cholecystectomy on 08/26/2016. He tolerated the procedure well. His postoperative course was remarkable for low blood pressures. In talking to the family, they state that his blood pressure has been running low for several weeks now. He was continuing the lisinopril during that time. That will be stopped. He has tolerated a diet well. He is being discharged home on postoperative day 1 in good and improving condition.  Treatments: surgery: Open cholecystectomy on 08/26/2016  Discharge Exam: Blood pressure (!) 105/54, pulse 73, temperature 98.2 F (36.8 C), temperature source Oral, resp. rate 18, height 5\' 11"  (1.803 m), weight 210 lb (95.3 kg), SpO2 95 %. General appearance: alert, cooperative and no distress Resp: clear to auscultation bilaterally Cardio: regular rate and rhythm, S1, S2 normal, no murmur, click, rub or gallop GI: Soft, bowel sounds active. Incision healing well.  Disposition: 01-Home or Self Care  Discharge Instructions    Diet - low sodium heart healthy    Complete by:  As directed    Increase activity slowly    Complete by:  As directed      Allergies as of 08/27/2016      Reactions   Augmentin [amoxicillin-pot Clavulanate] Rash      Medication List    STOP taking these medications   lisinopril 5 MG tablet Commonly known as:  PRINIVIL,ZESTRIL     TAKE these  medications   aspirin 81 MG chewable tablet Chew 1 tablet (81 mg total) by mouth daily. Intended to hold for next 3 days, and resume on 08/23/2016   Canagliflozin-Metformin HCl ER [phone] MG Tb24 Commonly known as:  INVOKAMET XR Take 1 tablet by mouth daily with breakfast.   fenofibrate 145 MG tablet Commonly known as:  TRICOR Take 1 tablet (145 mg total) by mouth daily.   Fish Oil 1200 MG Caps Take 2,400 mg by mouth daily.   oxyCODONE-acetaminophen 5-325 MG tablet Commonly known as:  PERCOCET/ROXICET Take 1-2 tablets by mouth every 4 (four) hours as needed for moderate pain.   pantoprazole 40 MG tablet Commonly known as:  PROTONIX Take 1 tablet (40 mg total) by mouth 2 (two) times daily.   saccharomyces boulardii 250 MG capsule Commonly known as:  FLORASTOR Take 1 capsule (250 mg total) by mouth 2 (two) times daily.      Follow-up Information    Franky MachoJenkins, Ala Capri, MD. Schedule an appointment as soon as possible for a visit on 09/06/2016.   Specialty:  General Surgery Contact information: [address] [place] [id number] [phone]           Signed: Franky MachoMark Zanayah Shadowens 08/27/2016, 10:20 AM

## 2016-08-27 NOTE — Progress Notes (Signed)
Discharge instructions gone over with patient and wife. Verbalized understanding. Printed prescription given to patient. IV removed, patient tolerated procedure well.

## 2016-08-27 NOTE — Discharge Instructions (Signed)
Open Cholecystectomy, Care After This sheet gives you information about how to care for yourself after your procedure. Your health care provider may also give you more specific instructions. If you have problems or questions, contact your health care provider. What can I expect after the procedure? After the procedure, it is common to have:  Pain at your incision site. You will be given medicines to control this pain.  Mild nausea or vomiting. Follow these instructions at home: Incision care    Follow instructions from your health care provider about how to take care of your incision. Make sure you:  Wash your hands with soap and water before you change your bandage (dressing). If soap and water are not available, use hand sanitizer.  Change your dressing as told by your health care provider.  Leave stitches (sutures), skin glue, or adhesive strips in place. These skin closures may need to be in place for 2 weeks or longer. If adhesive strip edges start to loosen and curl up, you may trim the loose edges. Do not remove adhesive strips completely unless your health care provider tells you to do that.  Do not take baths, swim, or use a hot tub until your health care provider approves. Ask your health care provider if you can take showers. You may only be allowed to take sponge baths for bathing.  Check your incision area every day for signs of infection. Check for:  More redness, swelling, or pain.  More fluid or blood.  Warmth.  Pus or a bad smell. Activity   Do not drive or use heavy machinery while taking prescription pain medicine.  Do not lift anything that is heavier than 10 lb (4.5 kg) until your health care provider approves.  Do not play contact sports until your health care provider approves.  Do not drive for 24 hours if you were given a medicine to help you relax (sedative).  Rest as needed. Do not return to work or school until your health care provider  approves. General instructions   Take over-the-counter and prescription medicines only as told by your health care provider.  To prevent or treat constipation while you are taking prescription pain medicine, your health care provider may recommend that you:  Drink enough fluid to keep your urine clear or pale yellow.  Take over-the-counter or prescription medicines.  Eat foods that are high in fiber, such as fresh fruits and vegetables, whole grains, and beans.  Limit foods that are high in fat and processed sugars, such as fried and sweet foods. Contact a health care provider if:  You develop a rash.  You have more redness, swelling, or pain around your incision.  You have more fluid or blood coming from your incision.  Your incision feels warm to the touch.  You have pus or a bad smell coming from your incision.  You have a fever.  Your incision breaks open. Get help right away if:  You have trouble breathing.  You have chest pain.  You have increasing pain in your shoulders.  You faint or feel dizzy when you stand.  You have severe pain in your abdomen.  You have nausea or vomiting that lasts for more than one day.  You have leg pain. This information is not intended to replace advice given to you by your health care provider. Make sure you discuss any questions you have with your health care provider. Document Released: 07/14/2003 Document Revised: 10/17/2015 Document Reviewed: 09/14/2015 Elsevier Interactive Patient  Education  2017 [employer].

## 2016-08-29 ENCOUNTER — Encounter (HOSPITAL_COMMUNITY): Payer: Self-pay | Admitting: General Surgery

## 2016-09-06 ENCOUNTER — Encounter: Payer: Self-pay | Admitting: General Surgery

## 2016-09-06 ENCOUNTER — Ambulatory Visit (INDEPENDENT_AMBULATORY_CARE_PROVIDER_SITE_OTHER): Payer: Self-pay | Admitting: General Surgery

## 2016-09-06 ENCOUNTER — Encounter: Payer: Self-pay | Admitting: "Endocrinology

## 2016-09-06 ENCOUNTER — Ambulatory Visit (INDEPENDENT_AMBULATORY_CARE_PROVIDER_SITE_OTHER): Payer: BLUE CROSS/BLUE SHIELD | Admitting: "Endocrinology

## 2016-09-06 VITALS — BP 135/76 | HR 74 | Ht 71.0 in | Wt 204.0 lb

## 2016-09-06 VITALS — BP 148/71 | HR 75 | Temp 97.3°F | Resp 18 | Ht 71.0 in | Wt 205.0 lb

## 2016-09-06 DIAGNOSIS — E7800 Pure hypercholesterolemia, unspecified: Secondary | ICD-10-CM | POA: Diagnosis not present

## 2016-09-06 DIAGNOSIS — IMO0002 Reserved for concepts with insufficient information to code with codable children: Secondary | ICD-10-CM

## 2016-09-06 DIAGNOSIS — E1165 Type 2 diabetes mellitus with hyperglycemia: Secondary | ICD-10-CM | POA: Diagnosis not present

## 2016-09-06 DIAGNOSIS — Z09 Encounter for follow-up examination after completed treatment for conditions other than malignant neoplasm: Secondary | ICD-10-CM

## 2016-09-06 DIAGNOSIS — E118 Type 2 diabetes mellitus with unspecified complications: Secondary | ICD-10-CM | POA: Diagnosis not present

## 2016-09-06 DIAGNOSIS — Z6838 Body mass index (BMI) 38.0-38.9, adult: Secondary | ICD-10-CM | POA: Diagnosis not present

## 2016-09-06 DIAGNOSIS — I10 Essential (primary) hypertension: Secondary | ICD-10-CM | POA: Diagnosis not present

## 2016-09-06 MED ORDER — METFORMIN HCL 500 MG PO TABS: 500.0000 mg | ORAL_TABLET | Freq: Two times a day (BID) | ORAL | 2 refills | Status: DC

## 2016-09-06 NOTE — Progress Notes (Signed)
Subjective:     Javier SchatzWilliam Walker  Status post open cholecystectomy. Doing well. Has no complaints. Objective:    BP (!) 148/71   Pulse 75   Temp 97.3 F (36.3 C)   Resp 18   Ht 5\' 11"  (1.803 m)   Wt 205 lb (93 kg)   BMI 28.59 kg/m   General:  alert, cooperative and no distress  Abdomen soft, incision healing well. Staples removed, Steri-Strips applied. Final pathology consistent with diagnosis     Assessment:    Doing well postoperatively.    Plan:  Increase activity as able. Follow-up here as needed.

## 2016-09-06 NOTE — Progress Notes (Signed)
Subjective:    Patient ID: Javier Walker, male    DOB: June 04, 1950. Patient is being seen in f/u for management of diabetes requested by  Assunta FoundGolding, John, MD  Past Medical History:  Diagnosis Date  . Diabetes mellitus without complication (HCC)   . GERD (gastroesophageal reflux disease)    occ-no meds  . Hives    patient has hives with anxiety  . Hyperlipemia   . Hypertension   . Shortness of breath    Past Surgical History:  Procedure Laterality Date  . CHOLECYSTECTOMY N/A 08/26/2016   Procedure: CHOLECYSTECTOMY;  Surgeon: Franky MachoJenkins, Mark, MD;  Location: [facility];  Service: General;  Laterality: N/A;  . COLON SURGERY  2011   gangrene lower colon with colostomy  . COLOSTOMY REVERSAL  2011  . ERCP N/A 08/17/2016   Procedure: ENDOSCOPIC RETROGRADE CHOLANGIOPANCREATOGRAPHY (ERCP) Biliary sphincterotomy and stone extraction;  Surgeon: Malissa Hippoehman, Najeeb U, MD;  Location: [facility];  Service: Endoscopy;  Laterality: N/A;  . OPEN REDUCTION INTERNAL FIXATION (ORIF) TIBIA/FIBULA FRACTURE Right 08/30/2012   Procedure: OPEN REDUCTION INTERNAL FIXATION (ORIF) TIBIA/FIBULA FRACTURE;  Surgeon: Toni ArthursJohn Hewitt, MD;  Location: [facility];  Service: Orthopedics;  Laterality: Right;  . WOUND DEBRIDEMENT  2011   abd-   Social History   Social History  . Marital status: Married    Spouse name: N/A  . Number of children: N/A  . Years of education: N/A   Social History Main Topics  . Smoking status: Former Smoker    Packs/day: 2.00    Years: 15.00    Types: Cigarettes    Quit date: 08/27/1988  . Smokeless tobacco: Never Used  . Alcohol use Yes     Comment: rarely  . Drug use: No  . Sexual activity: Yes    Birth control/ protection: None   Other Topics Concern  . None   Social History Narrative  . None   Outpatient Encounter Prescriptions as of 09/06/2016  Medication Sig  . aspirin 81 MG chewable tablet Chew 1 tablet (81 mg total) by mouth daily. Intended to hold for next 3  days, and resume on 08/23/2016  . fenofibrate (TRICOR) 145 MG tablet Take 1 tablet (145 mg total) by mouth daily.  . metFORMIN (GLUCOPHAGE) 500 MG tablet Take 1 tablet (500 mg total) by mouth 2 (two) times daily with a meal.  . Omega-3 Fatty Acids (FISH OIL) 1200 MG CAPS Take 2,400 mg by mouth daily.  [name]. oxyCODONE-acetaminophen (PERCOCET/ROXICET) 5-325 MG tablet Take 1-2 tablets by mouth every 4 (four) hours as needed for moderate pain.  . pantoprazole (PROTONIX) 40 MG tablet Take 1 tablet (40 mg total) by mouth 2 (two) times daily.  [name]. saccharomyces boulardii (FLORASTOR) 250 MG capsule Take 1 capsule (250 mg total) by mouth 2 (two) times daily.  . [DISCONTINUED] Canagliflozin-Metformin HCl ER (INVOKAMET XR) [phone] MG TB24 Take 1 tablet by mouth daily with breakfast.   No facility-administered encounter medications on file as of 09/06/2016.    ALLERGIES: Allergies  Allergen Reactions  . Augmentin [Amoxicillin-Pot Clavulanate] Rash   VACCINATION STATUS:  There is no immunization history on file for this patient.  Diabetes  He presents for his follow-up diabetic visit. He has type 2 diabetes mellitus. Onset time: He was diagnosed at approximate age of 66 years. His disease course has been worsening (He did have interim hospitalization due to complicated cholelythiasis, did have acute on chronic renal insufficiency.). There are no hypoglycemic associated symptoms. Pertinent negatives for  hypoglycemia include no confusion, headaches, pallor or seizures. Associated symptoms include blurred vision. Pertinent negatives for diabetes include no chest pain, no fatigue, no polydipsia, no polyphagia, no polyuria and no weakness. There are no hypoglycemic complications. Symptoms are worsening. There are no diabetic complications. Risk factors for coronary artery disease include diabetes mellitus, dyslipidemia, hypertension, male sex, obesity, tobacco exposure and sedentary lifestyle. Current diabetic treatment  includes oral agent (dual therapy). His weight is decreasing steadily. He is following a generally unhealthy diet. When asked about meal planning, he reported none. He has not had a previous visit with a dietitian. He never participates in exercise. An ACE inhibitor/angiotensin II receptor blocker is being taken. Eye exam is current.  Hyperlipidemia  This is a chronic problem. The current episode started more than 1 year ago. Pertinent negatives include no chest pain, myalgias or shortness of breath. Current antihyperlipidemic treatment includes statins. Risk factors for coronary artery disease include diabetes mellitus, dyslipidemia, hypertension, male sex, obesity and a sedentary lifestyle.  Hypertension  This is a chronic problem. The current episode started more than 1 year ago. The problem is controlled. Associated symptoms include blurred vision. Pertinent negatives include no chest pain, headaches, neck pain, palpitations or shortness of breath. Risk factors for coronary artery disease include dyslipidemia, obesity, male gender, sedentary lifestyle and smoking/tobacco exposure. Past treatments include ACE inhibitors.   Review of Systems  Constitutional: Positive for unexpected weight change. Negative for chills, fatigue and fever.  HENT: Negative for dental problem, mouth sores and trouble swallowing.   Eyes: Positive for blurred vision. Negative for visual disturbance.  Respiratory: Negative for cough, choking, chest tightness, shortness of breath and wheezing.   Cardiovascular: Negative for chest pain, palpitations and leg swelling.  Gastrointestinal: Negative for abdominal distention, abdominal pain, constipation, diarrhea, nausea and vomiting.  Endocrine: Negative for polydipsia, polyphagia and polyuria.  Genitourinary: Negative for dysuria, flank pain, hematuria and urgency.  Musculoskeletal: Negative for back pain, gait problem, myalgias and neck pain.  Skin: Negative for pallor, rash  and wound.  Neurological: Negative for seizures, syncope, weakness, numbness and headaches.  Psychiatric/Behavioral: Negative.  Negative for confusion and dysphoric mood.    Objective:    BP 135/76   Pulse 74   Ht 5\' 11"  (1.803 m)   Wt 204 lb (92.5 kg)   BMI 28.45 kg/m   Wt Readings from Last 3 Encounters:  09/06/16 204 lb (92.5 kg)  09/06/16 205 lb (93 kg)  08/26/16 210 lb (95.3 kg)    Physical Exam  Constitutional: He is oriented to person, place, and time. He appears well-developed. He is cooperative. No distress.  HENT:  Head: Normocephalic and atraumatic.  Eyes: EOM are normal.  Neck: Normal range of motion. Neck supple. No tracheal deviation present. No thyromegaly present.  Cardiovascular: Normal rate, S1 normal, S2 normal and normal heart sounds.  Exam reveals no gallop.   No murmur heard. Pulses:      Dorsalis pedis pulses are 1+ on the right side, and 1+ on the left side.       Posterior tibial pulses are 1+ on the right side, and 1+ on the left side.  Pulmonary/Chest: Breath sounds normal. No respiratory distress. He has no wheezes.  Abdominal: Soft. Bowel sounds are normal. He exhibits no distension. There is no tenderness. There is no guarding and no CVA tenderness.  Musculoskeletal: He exhibits no edema.       Right shoulder: He exhibits no swelling and no deformity.  Neurological: He  is alert and oriented to person, place, and time. He has normal strength and normal reflexes. No cranial nerve deficit or sensory deficit. Gait normal.  Skin: Skin is warm and dry. No rash noted. No cyanosis. Nails show no clubbing.  Psychiatric: His speech is normal. Thought content normal. Cognition and memory are normal.  Patient is reluctant to engage.   CMP     Component Value Date/Time   NA 135 08/27/2016 0618   K 4.8 08/27/2016 0618   CL 102 08/27/2016 0618   CO2 27 08/27/2016 0618   GLUCOSE 136 (H) 08/27/2016 0618   BUN 24 (H) 08/27/2016 0618   CREATININE 1.53 (H)  08/27/2016 0618   CREATININE 1.39 (H) 08/25/2016 0846   CALCIUM 8.2 (L) 08/27/2016 0618   PROT 5.8 (L) 08/27/2016 0618   ALBUMIN 2.6 (L) 08/27/2016 0618   AST 33 08/27/2016 0618   ALT 30 08/27/2016 0618   ALKPHOS 46 08/27/2016 0618   BILITOT 1.4 (H) 08/27/2016 0618   GFRNONAA 46 (L) 08/27/2016 0618   GFRAA 53 (L) 08/27/2016 0618   Lipid Panel     Component Value Date/Time   CHOL 150 04/26/2016 0936   TRIG 189 (H) 04/26/2016 0936   HDL 27 (L) 04/26/2016 0936   CHOLHDL 5.6 (H) 04/26/2016 0936   VLDL 38 (H) 04/26/2016 0936   LDLCALC 85 04/26/2016 0936    Results for TAIJUAN, SERVISS (MRN [id number]) as of 09/06/2016 15:44  Ref. Range 08/25/2016 08:46  Glucose Latest Ref Range: 65 - 99 mg/dL 409 (H)  Hemoglobin W1X Latest Ref Range: <5.7 % 7.5 (H)     Assessment & Plan:   1. Uncontrolled type 2 diabetes mellitus with CKD - Patient has currently uncontrolled symptomatic type 2 DM since  66 years of age. - He did have interim hospitalization due to [facility] cholelithiasis. His A1c increased to 7.5% from 6.7%, although generally improving from 11%.   - He does not have recent labs to review.  His diabetes is complicated by obesity/sedentary life and patient remains at a high risk for more acute and chronic complications of diabetes which include CAD, CVA, CKD, retinopathy, and neuropathy. These are all discussed in detail with the patient.  - I have counseled the patient on diet management and weight loss, by adopting a carbohydrate restricted/protein rich diet.  - Suggestion is made for patient to avoid simple carbohydrates   from their diet including Cakes , Desserts, Ice Cream,  Soda (  diet and regular) , Sweet Tea , Candies,  Chips, Cookies, Artificial Sweeteners,   and "Sugar-free" Products . This will help patient to have stable blood glucose profile and potentially avoid unintended weight gain.  - I encouraged the patient to switch to  unprocessed or minimally processed  complex starch and increased protein intake (animal or plant source), fruits, and vegetables.  - Patient is advised to stick to a routine mealtimes to eat 3 meals  a day and avoid unnecessary snacks ( to snack only to correct hypoglycemia).  - The patient will be scheduled with Norm Salt, RDN, CDE for individualized DM education.  - I have approached patient with the following individualized plan to manage diabetes and patient agrees:  - I will discontinue  Invokamet   for now due to acute on chronic renal insufficiency. - I will prescribe low-dose metformin 500 mg by mouth twice a day. He is advised to monitor blood glucose daily at fasting and call back if readings are above 200 mg/dL.  -  He may need temporary placement with basal insulin if he course back with hyperglycemia.    - Patient specific target  A1c;  LDL, HDL, Triglycerides, and  Waist Circumference were discussed in detail.  2) BP/HTN: Controlled. Continue current medications . 3) Lipids/HPL:  Controlled LDL at 85,  he is taken off of statins due to transaminitis. I advised him to stay off of statins until next fasting lipid panel. 4)  Weight/Diet: CDE Consult in progress  , exercise, and detailed carbohydrates information provided.  5) Chronic Care/Health Maintenance:  -Patient is on ACEI/ARB and Statin medications and encouraged to continue to follow up with Ophthalmology, Podiatrist at least yearly or according to recommendations, and advised to   stay away from smoking. I have recommended yearly flu vaccine and pneumonia vaccination at least every 5 years; moderate intensity exercise for up to 150 minutes weekly; and  sleep for at least 7 hours a day.  - 30 minutes of time was spent on the care of this patient , 50% of which was applied for counseling on diabetes complications and their preventions.  - Patient to bring meter and  blood glucose logs during his next visit.   - I advised patient to maintain close follow  up with Assunta Found, MD for primary care needs.  Follow up plan: - Return in about 3 months (around 12/07/2016) for follow up with pre-visit labs, meter, and logs.  Marquis Lunch, MD Phone: [phone]  Fax: [phone]   09/06/2016, 4:11 PM

## 2016-11-21 ENCOUNTER — Other Ambulatory Visit: Payer: Self-pay | Admitting: "Endocrinology

## 2016-11-21 LAB — COMPREHENSIVE METABOLIC PANEL
ALT: 22 U/L (ref 9–46)
AST: 23 U/L (ref 10–35)
Albumin: 4.3 g/dL (ref 3.6–5.1)
Alkaline Phosphatase: 46 U/L (ref 40–115)
BILIRUBIN TOTAL: 0.6 mg/dL (ref 0.2–1.2)
BUN: 17 mg/dL (ref 7–25)
CALCIUM: 10 mg/dL (ref 8.6–10.3)
CO2: 24 mmol/L (ref 20–32)
Chloride: 101 mmol/L (ref 98–110)
Creat: 1.16 mg/dL (ref 0.70–1.25)
GLUCOSE: 144 mg/dL — AB (ref 65–99)
Potassium: 4.8 mmol/L (ref 3.5–5.3)
Sodium: 137 mmol/L (ref 135–146)
Total Protein: 7.9 g/dL (ref 6.1–8.1)

## 2016-11-22 LAB — HEMOGLOBIN A1C
Hgb A1c MFr Bld: 7.3 % — ABNORMAL HIGH (ref <5.7)
Mean Plasma Glucose: 163 mg/dL

## 2016-12-07 ENCOUNTER — Ambulatory Visit (INDEPENDENT_AMBULATORY_CARE_PROVIDER_SITE_OTHER): Payer: BLUE CROSS/BLUE SHIELD | Admitting: "Endocrinology

## 2016-12-07 ENCOUNTER — Encounter: Payer: Self-pay | Admitting: "Endocrinology

## 2016-12-07 VITALS — BP 145/68 | HR 78 | Ht 71.0 in | Wt 224.0 lb

## 2016-12-07 DIAGNOSIS — I10 Essential (primary) hypertension: Secondary | ICD-10-CM | POA: Diagnosis not present

## 2016-12-07 DIAGNOSIS — IMO0002 Reserved for concepts with insufficient information to code with codable children: Secondary | ICD-10-CM

## 2016-12-07 DIAGNOSIS — E1165 Type 2 diabetes mellitus with hyperglycemia: Secondary | ICD-10-CM | POA: Diagnosis not present

## 2016-12-07 DIAGNOSIS — E782 Mixed hyperlipidemia: Secondary | ICD-10-CM

## 2016-12-07 DIAGNOSIS — E118 Type 2 diabetes mellitus with unspecified complications: Secondary | ICD-10-CM | POA: Diagnosis not present

## 2016-12-07 MED ORDER — LISINOPRIL 10 MG PO TABS: 10.0000 mg | ORAL_TABLET | Freq: Every day | ORAL | 3 refills | Status: DC

## 2016-12-07 MED ORDER — FENOFIBRATE 145 MG PO TABS: 145.0000 mg | ORAL_TABLET | Freq: Every day | ORAL | 3 refills | Status: DC

## 2016-12-07 MED ORDER — METFORMIN HCL 500 MG PO TABS: 500.0000 mg | ORAL_TABLET | Freq: Two times a day (BID) | ORAL | 3 refills | Status: DC

## 2016-12-07 MED ORDER — DAPAGLIFLOZIN PROPANEDIOL 5 MG PO TABS: 5.0000 mg | ORAL_TABLET | Freq: Every day | ORAL | 3 refills | Status: DC

## 2016-12-07 NOTE — Progress Notes (Signed)
Subjective:    Patient ID: Javier Walker, male    DOB: [birth year]. Patient is being seen in f/u for management of diabetes, Hypertension, hyperlipidemia.   Past Medical History:  Diagnosis Date  . Diabetes mellitus without complication (HCC)   . GERD (gastroesophageal reflux disease)    occ-no meds  . Hives    patient has hives with anxiety  . Hyperlipemia   . Hypertension   . Shortness of breath    Past Surgical History:  Procedure Laterality Date  . CHOLECYSTECTOMY N/A 08/26/2016   Procedure: CHOLECYSTECTOMY;  Surgeon: Franky Macho, MD;  Location: [facility];  Service: General;  Laterality: N/A;  . COLON SURGERY  2011   gangrene lower colon with colostomy  . COLOSTOMY REVERSAL  2011  . ERCP N/A 08/17/2016   Procedure: ENDOSCOPIC RETROGRADE CHOLANGIOPANCREATOGRAPHY (ERCP) Biliary sphincterotomy and stone extraction;  Surgeon: Malissa Hippo, MD;  Location: [facility];  Service: Endoscopy;  Laterality: N/A;  . OPEN REDUCTION INTERNAL FIXATION (ORIF) TIBIA/FIBULA FRACTURE Right 08/30/2012   Procedure: OPEN REDUCTION INTERNAL FIXATION (ORIF) TIBIA/FIBULA FRACTURE;  Surgeon: Toni Arthurs, MD;  Location: [facility];  Service: Orthopedics;  Laterality: Right;  . WOUND DEBRIDEMENT  2011   abd-   Social History   Social History  . Marital status: Married    Spouse name: N/A  . Number of children: N/A  . Years of education: N/A   Social History Main Topics  . Smoking status: Former Smoker    Packs/day: 2.00    Years: 15.00    Types: Cigarettes    Quit date: 08/27/1988  . Smokeless tobacco: Never Used  . Alcohol use Yes     Comment: rarely  . Drug use: No  . Sexual activity: Yes    Birth control/ protection: None   Other Topics Concern  . None   Social History Narrative  . None   Outpatient Encounter Prescriptions as of 12/07/2016  Medication Sig  . aspirin 81 MG chewable tablet Chew 1 tablet (81 mg total) by mouth daily. Intended to hold for next 3  days, and resume on 08/23/2016  . dapagliflozin propanediol (FARXIGA) 5 MG TABS tablet Take 5 mg by mouth daily.  . fenofibrate (TRICOR) 145 MG tablet Take 1 tablet (145 mg total) by mouth daily.  [name] lisinopril (PRINIVIL,ZESTRIL) 10 MG tablet Take 1 tablet (10 mg total) by mouth daily.  . metFORMIN (GLUCOPHAGE) 500 MG tablet Take 1 tablet (500 mg total) by mouth 2 (two) times daily with a meal.  . Omega-3 Fatty Acids (FISH OIL) 1200 MG CAPS Take 2,400 mg by mouth daily.  [name] oxyCODONE-acetaminophen (PERCOCET/ROXICET) 5-325 MG tablet Take 1-2 tablets by mouth every 4 (four) hours as needed for moderate pain.  . pantoprazole (PROTONIX) 40 MG tablet Take 1 tablet (40 mg total) by mouth 2 (two) times daily.  [name] saccharomyces boulardii (FLORASTOR) 250 MG capsule Take 1 capsule (250 mg total) by mouth 2 (two) times daily.  . [DISCONTINUED] fenofibrate (TRICOR) 145 MG tablet Take 1 tablet (145 mg total) by mouth daily.  . [DISCONTINUED] metFORMIN (GLUCOPHAGE) 500 MG tablet Take 1 tablet (500 mg total) by mouth 2 (two) times daily with a meal.   No facility-administered encounter medications on file as of 12/07/2016.    ALLERGIES: Allergies  Allergen Reactions  . Augmentin [Amoxicillin-Pot Clavulanate] Rash   VACCINATION STATUS:  There is no immunization history on file for this patient.  Diabetes  He presents for his follow-up diabetic  visit. He has type 2 diabetes mellitus. Onset time: He was diagnosed at approximate age of 66 years. His disease course has been stable. There are no hypoglycemic associated symptoms. Pertinent negatives for hypoglycemia include no confusion, headaches, pallor or seizures. Associated symptoms include blurred vision. Pertinent negatives for diabetes include no chest pain, no fatigue, no polydipsia, no polyphagia, no polyuria and no weakness. There are no hypoglycemic complications. Symptoms are stable. There are no diabetic complications. Risk factors for coronary artery  disease include diabetes mellitus, dyslipidemia, hypertension, male sex, obesity, tobacco exposure and sedentary lifestyle. Current diabetic treatment includes oral agent (dual therapy). His weight is increasing steadily. He is following a generally unhealthy diet. When asked about meal planning, he reported none. He has not had a previous visit with a dietitian. He never participates in exercise. An ACE inhibitor/angiotensin II receptor blocker is being taken. Eye exam is current.  Hyperlipidemia  This is a chronic problem. The current episode started more than 1 year ago. Pertinent negatives include no chest pain, myalgias or shortness of breath. Current antihyperlipidemic treatment includes statins. Risk factors for coronary artery disease include diabetes mellitus, dyslipidemia, hypertension, male sex, obesity and a sedentary lifestyle.  Hypertension  This is a chronic problem. The current episode started more than 1 year ago. The problem is controlled. Associated symptoms include blurred vision. Pertinent negatives include no chest pain, headaches, neck pain, palpitations or shortness of breath. Risk factors for coronary artery disease include dyslipidemia, obesity, male gender, sedentary lifestyle and smoking/tobacco exposure. Past treatments include ACE inhibitors.   Review of Systems  Constitutional: Positive for unexpected weight change. Negative for chills, fatigue and fever.  HENT: Negative for dental problem, mouth sores and trouble swallowing.   Eyes: Positive for blurred vision. Negative for visual disturbance.  Respiratory: Negative for cough, choking, chest tightness, shortness of breath and wheezing.   Cardiovascular: Negative for chest pain, palpitations and leg swelling.  Gastrointestinal: Negative for abdominal distention, abdominal pain, constipation, diarrhea, nausea and vomiting.  Endocrine: Negative for polydipsia, polyphagia and polyuria.  Genitourinary: Negative for dysuria,  flank pain, hematuria and urgency.  Musculoskeletal: Negative for back pain, gait problem, myalgias and neck pain.  Skin: Negative for pallor, rash and wound.  Neurological: Negative for seizures, syncope, weakness, numbness and headaches.  Psychiatric/Behavioral: Negative.  Negative for confusion and dysphoric mood.    Objective:    BP (!) 145/68   Pulse 78   Ht 5\' 11"  (1.803 m)   Wt 224 lb (101.6 kg)   BMI 31.24 kg/m   Wt Readings from Last 3 Encounters:  12/07/16 224 lb (101.6 kg)  09/06/16 204 lb (92.5 kg)  09/06/16 205 lb (93 kg)    Physical Exam  Constitutional: He is oriented to person, place, and time. He appears well-developed. He is cooperative. No distress.  HENT:  Head: Normocephalic and atraumatic.  Eyes: EOM are normal.  Neck: Normal range of motion. Neck supple. No tracheal deviation present. No thyromegaly present.  Cardiovascular: Normal rate, S1 normal, S2 normal and normal heart sounds.  Exam reveals no gallop.   No murmur heard. Pulses:      Dorsalis pedis pulses are 1+ on the right side, and 1+ on the left side.       Posterior tibial pulses are 1+ on the right side, and 1+ on the left side.  Pulmonary/Chest: Breath sounds normal. No respiratory distress. He has no wheezes.  Abdominal: Soft. Bowel sounds are normal. He exhibits no distension. There is  no tenderness. There is no guarding and no CVA tenderness.  Musculoskeletal: He exhibits no edema.       Right shoulder: He exhibits no swelling and no deformity.  Neurological: He is alert and oriented to person, place, and time. He has normal strength and normal reflexes. No cranial nerve deficit or sensory deficit. Gait normal.  Skin: Skin is warm and dry. No rash noted. No cyanosis. Nails show no clubbing.  Psychiatric: His speech is normal. Thought content normal. Cognition and memory are normal.   CMP  Lipid Panel     Component Value Date/Time   CHOL 150 04/26/2016 0936   TRIG 189 (H) 04/26/2016  0936   HDL 27 (L) 04/26/2016 0936   CHOLHDL 5.6 (H) 04/26/2016 0936   VLDL 38 (H) 04/26/2016 0936   LDLCALC 85 04/26/2016 0936   Recent Results (from the past 2160 hour(s))  Comprehensive metabolic panel     Status: Abnormal   Collection Time: 11/21/16  9:47 AM  Result Value Ref Range   Sodium 137 135 - 146 mmol/L   Potassium 4.8 3.5 - 5.3 mmol/L   Chloride 101 98 - 110 mmol/L   CO2 24 20 - 32 mmol/L    Comment: ** Please note change in reference range(s). **      Glucose, Bld 144 (H) 65 - 99 mg/dL   BUN 17 7 - 25 mg/dL   Creat 9.60 4.54 - 0.98 mg/dL    Comment:   For patients > or = 66 years of age: The upper reference limit for Creatinine is approximately 13% higher for people identified as African-American.      Total Bilirubin 0.6 0.2 - 1.2 mg/dL   Alkaline Phosphatase 46 40 - 115 U/L   AST 23 10 - 35 U/L   ALT 22 9 - 46 U/L   Total Protein 7.9 6.1 - 8.1 g/dL   Albumin 4.3 3.6 - 5.1 g/dL   Calcium 11.9 8.6 - 14.7 mg/dL  Hemoglobin W2N     Status: Abnormal   Collection Time: 11/21/16  9:47 AM  Result Value Ref Range   Hgb A1c MFr Bld 7.3 (H) <5.7 %    Comment:   For someone without known diabetes, a hemoglobin A1c value of 6.5% or greater indicates that they may have diabetes and this should be confirmed with a follow-up test.   For someone with known diabetes, a value <7% indicates that their diabetes is well controlled and a value greater than or equal to 7% indicates suboptimal control. A1c targets should be individualized based on duration of diabetes, age, comorbid conditions, and other considerations.   Currently, no consensus exists for use of hemoglobin A1c for diagnosis of diabetes for children.      Mean Plasma Glucose 163 mg/dL      Assessment & Plan:   1. Uncontrolled type 2 diabetes mellitus with CKD - Patient has currently uncontrolled symptomatic type 2 DM since  66 years of age. - His A1c is stable at 7.3% ,  generally improving from  11%.   His diabetes is complicated by obesity/sedentary life and patient remains at a high risk for more acute and chronic complications of diabetes which include CAD, CVA, CKD, retinopathy, and neuropathy. These are all discussed in detail with the patient.  - I have counseled the patient on diet management and weight loss, by adopting a carbohydrate restricted/protein rich diet.  Suggestion is made for him to avoid simple carbohydrates  from his diet  including Cakes, Sweet Desserts, Ice Cream, Soda (diet and regular), Sweet Tea, Candies, Chips, Cookies, Store Bought Juices, Alcohol in Excess of  1-2 drinks a day, Artificial Sweeteners, and "Sugar-free" Products. This will help patient to have stable blood glucose profile and potentially avoid unintended weight gain.  - I encouraged the patient to switch to  unprocessed or minimally processed complex starch and increased protein intake (animal or plant source), fruits, and vegetables.  - Patient is advised to stick to a routine mealtimes to eat 3 meals  a day and avoid unnecessary snacks ( to snack only to correct hypoglycemia).   - I have approached patient with the following individualized plan to manage diabetes and patient agrees:   - I will continue  low-dose metformin 500 mg by mouth twice a day.  -  she is regaining his weight back, he would benefit from low-dose  SGLT2 inhibitors. I discussed and initiated low-dose Farxiga 5 mg by mouth every morning.  - He will not need insulin treatment at this time.  - Patient specific target  A1c;  LDL, HDL, Triglycerides, and  Waist Circumference were discussed in detail.  2) BP/HTN: uncontrolled. I discussed and reinitiated lisinopril 10 mg by mouth every morning.   3) Lipids/HPL:  Controlled LDL at 85,  he is taken off of statins due to transaminitis. I advised him to stay off of statins until next fasting lipid panel, but will continue on fenofibrate 160 mg by mouth daily.   4)  Weight/Diet:  CDE Consult in progress  , exercise, and detailed carbohydrates information provided.  5) Chronic Care/Health Maintenance:  -Patient is on ACEI/ARB and Statin medications and encouraged to continue to follow up with Ophthalmology, Podiatrist at least yearly or according to recommendations, and advised to   stay away from smoking. I have recommended yearly flu vaccine and pneumonia vaccination at least every 5 years; moderate intensity exercise for up to 150 minutes weekly; and  sleep for at least 7 hours a day.  - Time spent with the patient: 25 min, of which >50% was spent in reviewing his sugar logs , discussing his hypo- and hyper-glycemic episodes, reviewing his current and  previous labs and insulin doses and developing a plan to avoid hypo- and hyper-glycemia.    - I advised patient to maintain close follow up with Assunta FoundGolding, John, MD for primary care needs.  Follow up plan: - Return in about 4 months (around 04/08/2017) for follow up with pre-visit labs.  Marquis LunchGebre Mattson Dayal, MD Phone: [phone]  Fax: [phone]  This note was partially dictated with voice recognition software. Similar sounding words can be transcribed inadequately or may not  be corrected upon review.  12/07/2016, 4:09 PM

## 2016-12-07 NOTE — Patient Instructions (Signed)

## 2017-01-22 DIAGNOSIS — Z23 Encounter for immunization: Secondary | ICD-10-CM | POA: Diagnosis not present

## 2017-03-15 DIAGNOSIS — E118 Type 2 diabetes mellitus with unspecified complications: Secondary | ICD-10-CM | POA: Diagnosis not present

## 2017-03-15 DIAGNOSIS — E1165 Type 2 diabetes mellitus with hyperglycemia: Secondary | ICD-10-CM | POA: Diagnosis not present

## 2017-03-16 LAB — RENAL FUNCTION PANEL
Albumin: 4.1 g/dL (ref 3.6–5.1)
BUN/Creatinine Ratio: 15 (calc) (ref 6–22)
BUN: 20 mg/dL (ref 7–25)
CHLORIDE: 100 mmol/L (ref 98–110)
CO2: 27 mmol/L (ref 20–32)
CREATININE: 1.3 mg/dL — AB (ref 0.70–1.25)
Calcium: 9.7 mg/dL (ref 8.6–10.3)
GLUCOSE: 225 mg/dL — AB (ref 65–99)
POTASSIUM: 4.6 mmol/L (ref 3.5–5.3)
Phosphorus: 2.9 mg/dL (ref 2.1–4.3)
Sodium: 136 mmol/L (ref 135–146)

## 2017-03-16 LAB — HEMOGLOBIN A1C
Hgb A1c MFr Bld: 8.9 % of total Hgb — ABNORMAL HIGH (ref <5.7)
MEAN PLASMA GLUCOSE: 209 (calc)
eAG (mmol/L): 11.6 (calc)

## 2017-04-02 ENCOUNTER — Other Ambulatory Visit: Payer: Self-pay | Admitting: "Endocrinology

## 2017-04-12 ENCOUNTER — Encounter: Payer: Self-pay | Admitting: "Endocrinology

## 2017-04-12 ENCOUNTER — Ambulatory Visit (INDEPENDENT_AMBULATORY_CARE_PROVIDER_SITE_OTHER): Payer: BLUE CROSS/BLUE SHIELD | Admitting: "Endocrinology

## 2017-04-12 VITALS — BP 150/79 | HR 71 | Ht 71.0 in | Wt 223.0 lb

## 2017-04-12 DIAGNOSIS — E118 Type 2 diabetes mellitus with unspecified complications: Secondary | ICD-10-CM | POA: Diagnosis not present

## 2017-04-12 DIAGNOSIS — I10 Essential (primary) hypertension: Secondary | ICD-10-CM

## 2017-04-12 DIAGNOSIS — E782 Mixed hyperlipidemia: Secondary | ICD-10-CM

## 2017-04-12 DIAGNOSIS — E1165 Type 2 diabetes mellitus with hyperglycemia: Secondary | ICD-10-CM | POA: Diagnosis not present

## 2017-04-12 DIAGNOSIS — IMO0002 Reserved for concepts with insufficient information to code with codable children: Secondary | ICD-10-CM

## 2017-04-12 MED ORDER — METFORMIN HCL 500 MG PO TABS: ORAL_TABLET | ORAL | 3 refills | Status: DC

## 2017-04-12 MED ORDER — DAPAGLIFLOZIN PROPANEDIOL 5 MG PO TABS: 5.0000 mg | ORAL_TABLET | Freq: Every day | ORAL | 3 refills | Status: DC

## 2017-04-12 NOTE — Patient Instructions (Signed)

## 2017-04-12 NOTE — Progress Notes (Signed)
Subjective:    Patient ID: Javier Walker, male    DOB: Dec 18, 1950. Patient is being seen in f/u for management of diabetes, Hypertension, hyperlipidemia.   Past Medical History:  Diagnosis Date  . Diabetes mellitus without complication (HCC)   . GERD (gastroesophageal reflux disease)    occ-no meds  . Hives    patient has hives with anxiety  . Hyperlipemia   . Hypertension   . Shortness of breath    Past Surgical History:  Procedure Laterality Date  . CHOLECYSTECTOMY N/A 08/26/2016   Procedure: CHOLECYSTECTOMY;  Surgeon: Franky MachoJenkins, Mark, MD;  Location: [facility];  Service: General;  Laterality: N/A;  . COLON SURGERY  2011   gangrene lower colon with colostomy  . COLOSTOMY REVERSAL  2011  . ERCP N/A 08/17/2016   Procedure: ENDOSCOPIC RETROGRADE CHOLANGIOPANCREATOGRAPHY (ERCP) Biliary sphincterotomy and stone extraction;  Surgeon: Malissa Hippoehman, Najeeb U, MD;  Location: [facility];  Service: Endoscopy;  Laterality: N/A;  . OPEN REDUCTION INTERNAL FIXATION (ORIF) TIBIA/FIBULA FRACTURE Right 08/30/2012   Procedure: OPEN REDUCTION INTERNAL FIXATION (ORIF) TIBIA/FIBULA FRACTURE;  Surgeon: Toni ArthursJohn Hewitt, MD;  Location: [facility];  Service: Orthopedics;  Laterality: Right;  . WOUND DEBRIDEMENT  2011   abd-   Social History   Socioeconomic History  . Marital status: Married    Spouse name: None  . Number of children: None  . Years of education: None  . Highest education level: None  Social Needs  . Financial resource strain: None  . Food insecurity - worry: None  . Food insecurity - inability: None  . Transportation needs - medical: None  . Transportation needs - non-medical: None  Occupational History  . None  Tobacco Use  . Smoking status: Former Smoker    Packs/day: 2.00    Years: 15.00    Pack years: 30.00    Types: Cigarettes    Last attempt to quit: 08/27/1988    Years since quitting: 28.6  . Smokeless tobacco: Never Used  Substance and Sexual Activity  .  Alcohol use: Yes    Comment: rarely  . Drug use: No  . Sexual activity: Yes    Birth control/protection: None  Other Topics Concern  . None  Social History Narrative  . None   Outpatient Encounter Medications as of 04/12/2017  Medication Sig  . aspirin 81 MG chewable tablet Chew 1 tablet (81 mg total) by mouth daily. Intended to hold for next 3 days, and resume on 08/23/2016  . dapagliflozin propanediol (FARXIGA) 5 MG TABS tablet Take 5 mg by mouth daily.  . fenofibrate (TRICOR) 145 MG tablet Take 1 tablet (145 mg total) by mouth daily.  [name]. lisinopril (PRINIVIL,ZESTRIL) 10 MG tablet TAKE 1 TABLET BY MOUTH ONCE DAILY  . metFORMIN (GLUCOPHAGE) 500 MG tablet TAKE 1 TABLET BY MOUTH TWICE DAILY WITH A MEAL  . Omega-3 Fatty Acids (FISH OIL) 1200 MG CAPS Take 2,400 mg by mouth daily.  [name]. oxyCODONE-acetaminophen (PERCOCET/ROXICET) 5-325 MG tablet Take 1-2 tablets by mouth every 4 (four) hours as needed for moderate pain.  . pantoprazole (PROTONIX) 40 MG tablet Take 1 tablet (40 mg total) by mouth 2 (two) times daily.  [name]. saccharomyces boulardii (FLORASTOR) 250 MG capsule Take 1 capsule (250 mg total) by mouth 2 (two) times daily.  . [DISCONTINUED] FARXIGA 5 MG TABS tablet TAKE 1 TABLET BY MOUTH ONCE DAILY  . [DISCONTINUED] metFORMIN (GLUCOPHAGE) 500 MG tablet TAKE 1 TABLET BY MOUTH TWICE DAILY WITH A MEAL  No facility-administered encounter medications on file as of 04/12/2017.    ALLERGIES: Allergies  Allergen Reactions  . Augmentin [Amoxicillin-Pot Clavulanate] Rash   VACCINATION STATUS:  There is no immunization history on file for this patient.  Diabetes  He presents for his follow-up diabetic visit. He has type 2 diabetes mellitus. Onset time: He was diagnosed at approximate age of 67 years. His disease course has been worsening. There are no hypoglycemic associated symptoms. Pertinent negatives for hypoglycemia include no confusion, headaches, pallor or seizures. Associated symptoms  include blurred vision. Pertinent negatives for diabetes include no chest pain, no fatigue, no polydipsia, no polyphagia, no polyuria and no weakness. There are no hypoglycemic complications. Symptoms are worsening. There are no diabetic complications. Risk factors for coronary artery disease include diabetes mellitus, dyslipidemia, hypertension, male sex, obesity, tobacco exposure and sedentary lifestyle. Current diabetic treatment includes oral agent (dual therapy). His weight is stable. He is following a generally unhealthy diet. When asked about meal planning, he reported none. He has not had a previous visit with a dietitian. He never participates in exercise. An ACE inhibitor/angiotensin II receptor blocker is being taken. Eye exam is current.  Hyperlipidemia  This is a chronic problem. The current episode started more than 1 year ago. Pertinent negatives include no chest pain, myalgias or shortness of breath. Current antihyperlipidemic treatment includes statins. Risk factors for coronary artery disease include diabetes mellitus, dyslipidemia, hypertension, male sex, obesity and a sedentary lifestyle.  Hypertension  This is a chronic problem. The current episode started more than 1 year ago. The problem is controlled. Associated symptoms include blurred vision. Pertinent negatives include no chest pain, headaches, neck pain, palpitations or shortness of breath. Risk factors for coronary artery disease include dyslipidemia, obesity, male gender, sedentary lifestyle and smoking/tobacco exposure. Past treatments include ACE inhibitors.   Review of Systems  Constitutional: Positive for unexpected weight change. Negative for chills, fatigue and fever.  HENT: Negative for dental problem, mouth sores and trouble swallowing.   Eyes: Positive for blurred vision. Negative for visual disturbance.  Respiratory: Negative for cough, choking, chest tightness, shortness of breath and wheezing.   Cardiovascular:  Negative for chest pain, palpitations and leg swelling.  Gastrointestinal: Negative for abdominal distention, abdominal pain, constipation, diarrhea, nausea and vomiting.  Endocrine: Negative for polydipsia, polyphagia and polyuria.  Genitourinary: Negative for dysuria, flank pain, hematuria and urgency.  Musculoskeletal: Negative for back pain, gait problem, myalgias and neck pain.  Skin: Negative for pallor, rash and wound.  Neurological: Negative for seizures, syncope, weakness, numbness and headaches.  Psychiatric/Behavioral: Negative.  Negative for confusion and dysphoric mood.    Objective:    BP (!) 150/79   Pulse 71   Ht 5\' 11"  (1.803 m)   Wt 223 lb (101.2 kg)   BMI 31.10 kg/m   Wt Readings from Last 3 Encounters:  04/12/17 223 lb (101.2 kg)  12/07/16 224 lb (101.6 kg)  09/06/16 204 lb (92.5 kg)    Physical Exam  Constitutional: He is oriented to person, place, and time. He appears well-developed. He is cooperative. No distress.  HENT:  Head: Normocephalic and atraumatic.  Eyes: EOM are normal.  Neck: Normal range of motion. Neck supple. No tracheal deviation present. No thyromegaly present.  Cardiovascular: Normal rate, S1 normal, S2 normal and normal heart sounds. Exam reveals no gallop.  No murmur heard. Pulses:      Dorsalis pedis pulses are 1+ on the right side, and 1+ on the left side.  Posterior tibial pulses are 1+ on the right side, and 1+ on the left side.  Pulmonary/Chest: Breath sounds normal. No respiratory distress. He has no wheezes.  Abdominal: Soft. Bowel sounds are normal. He exhibits no distension. There is no tenderness. There is no guarding and no CVA tenderness.  Musculoskeletal: He exhibits no edema.       Right shoulder: He exhibits no swelling and no deformity.  Neurological: He is alert and oriented to person, place, and time. He has normal strength and normal reflexes. No cranial nerve deficit or sensory deficit. Gait normal.  Skin: Skin  is warm and dry. No rash noted. No cyanosis. Nails show no clubbing.  Psychiatric: His speech is normal. Thought content normal. Cognition and memory are normal.   CMP  Lipid Panel     Component Value Date/Time   CHOL 150 04/26/2016 0936   TRIG 189 (H) 04/26/2016 0936   HDL 27 (L) 04/26/2016 0936   CHOLHDL 5.6 (H) 04/26/2016 0936   VLDL 38 (H) 04/26/2016 0936   LDLCALC 85 04/26/2016 0936   Recent Results (from the past 2160 hour(s))  Renal function panel     Status: Abnormal   Collection Time: 03/15/17  7:33 AM  Result Value Ref Range   Glucose, Bld 225 (H) 65 - 99 mg/dL    Comment: .            Fasting reference interval . For someone without known diabetes, a glucose value >125 mg/dL indicates that they may have diabetes and this should be confirmed with a follow-up test. .    BUN 20 7 - 25 mg/dL   Creat 1.61 (H) 0.96 - 1.25 mg/dL    Comment: For patients >38 years of age, the reference limit for Creatinine is approximately 13% higher for people identified as African-American. .    BUN/Creatinine Ratio 15 6 - 22 (calc)   Sodium 136 135 - 146 mmol/L   Potassium 4.6 3.5 - 5.3 mmol/L   Chloride 100 98 - 110 mmol/L   CO2 27 20 - 32 mmol/L   Calcium 9.7 8.6 - 10.3 mg/dL   Phosphorus 2.9 2.1 - 4.3 mg/dL   Albumin 4.1 3.6 - 5.1 g/dL  Hemoglobin E4V     Status: Abnormal   Collection Time: 03/15/17  7:33 AM  Result Value Ref Range   Hgb A1c MFr Bld 8.9 (H) <5.7 % of total Hgb    Comment: For someone without known diabetes, a hemoglobin A1c value of 6.5% or greater indicates that they may have  diabetes and this should be confirmed with a follow-up  test. . For someone with known diabetes, a value <7% indicates  that their diabetes is well controlled and a value  greater than or equal to 7% indicates suboptimal  control. A1c targets should be individualized based on  duration of diabetes, age, comorbid conditions, and  other considerations. . Currently, no consensus  exists regarding use of hemoglobin A1c for diagnosis of diabetes for children. .    Mean Plasma Glucose 209 (calc)   eAG (mmol/L) 11.6 (calc)      Assessment & Plan:   1. Uncontrolled type 2 diabetes mellitus with CKD - Patient has currently uncontrolled symptomatic type 2 DM since  67 years of age. -  He admits to dietary indiscretion since last visit. His A1c is increasing to 8.9% from 7.3% , after generally improving from 11%. - His diabetes is complicated by obesity/sedentary life and patient remains at a  high risk for more acute and chronic complications of diabetes which include CAD, CVA, CKD, retinopathy, and neuropathy. These are all discussed in detail with the patient.  - I have counseled the patient on diet management and weight loss, by adopting a carbohydrate restricted/protein rich diet.  -  Suggestion is made for him to avoid simple carbohydrates  from his diet including Cakes, Sweet Desserts / Pastries, Ice Cream, Soda (diet and regular), Sweet Tea, Candies, Chips, Cookies, Store Bought Juices, Alcohol in Excess of  1-2 drinks a day, Artificial Sweeteners, and "Sugar-free" Products. This will help patient to have stable blood glucose profile and potentially avoid unintended weight gain.  - I encouraged the patient to switch to  unprocessed or minimally processed complex starch and increased protein intake (animal or plant source), fruits, and vegetables.  - Patient is advised to stick to a routine mealtimes to eat 3 meals  a day and avoid unnecessary snacks ( to snack only to correct hypoglycemia).   - I have approached patient with the following individualized plan to manage diabetes and patient agrees:   - Based on his presentation with higher A1c of 8.9% related to dietary indiscretion over the holidays, he will likely need at least basal insulin. - Due to stage 2-3 renal insufficiency, I advised him to continue low-dose metformin 500 mg by mouth twice a day and Farxiga  5 mg by mouth every morning with breakfast. Side effects and precautions discussed with him. - Patient specific target  A1c;  LDL, HDL, Triglycerides, and  Waist Circumference were discussed in detail.  2) BP/HTN: Uncontrolled, I advised him to continue lisinopril 10 mg by mouth every morning. He has not been consistent with this medication.  3) Lipids/HPL: Controlled with LDL of 85,   he is taken off of statins due to transaminitis. I advised him to stay off of statins until next fasting lipid panel, but will continue on fenofibrate 160 mg by mouth daily.   4)  Weight/Diet: CDE Consult in progress  , exercise, and detailed carbohydrates information provided.  5) Chronic Care/Health Maintenance:  -Patient is on ACEI/ARB and Statin medications and encouraged to continue to follow up with Ophthalmology, Podiatrist at least yearly or according to recommendations, and advised to   stay away from smoking. I have recommended yearly flu vaccine and pneumonia vaccination at least every 5 years; moderate intensity exercise for up to 150 minutes weekly; and  sleep for at least 7 hours a day.   - I advised patient to maintain close follow up with Assunta Found, MD for primary care needs.  Follow up plan: - Return in about 3 months (around 07/11/2017) for meter, and logs.  Marquis Lunch, MD Phone: [phone]  Fax: [phone]  This note was partially dictated with voice recognition software. Similar sounding words can be transcribed inadequately or may not  be corrected upon review.  04/12/2017, 4:10 PM

## 2017-04-24 ENCOUNTER — Other Ambulatory Visit: Payer: Self-pay | Admitting: "Endocrinology

## 2017-06-30 DIAGNOSIS — E1165 Type 2 diabetes mellitus with hyperglycemia: Secondary | ICD-10-CM | POA: Diagnosis not present

## 2017-06-30 DIAGNOSIS — E118 Type 2 diabetes mellitus with unspecified complications: Secondary | ICD-10-CM | POA: Diagnosis not present

## 2017-07-01 LAB — COMPLETE METABOLIC PANEL WITH GFR
AG RATIO: 1.2 (calc) (ref 1.0–2.5)
ALKALINE PHOSPHATASE (APISO): 39 U/L — AB (ref 40–115)
ALT: 36 U/L (ref 9–46)
AST: 31 U/L (ref 10–35)
Albumin: 4.1 g/dL (ref 3.6–5.1)
BILIRUBIN TOTAL: 0.7 mg/dL (ref 0.2–1.2)
BUN/Creatinine Ratio: 12 (calc) (ref 6–22)
BUN: 17 mg/dL (ref 7–25)
CHLORIDE: 101 mmol/L (ref 98–110)
CO2: 28 mmol/L (ref 20–32)
Calcium: 9.7 mg/dL (ref 8.6–10.3)
Creat: 1.4 mg/dL — ABNORMAL HIGH (ref 0.70–1.25)
GFR, Est African American: 60 mL/min/{1.73_m2} (ref >=60)
GFR, Est Non African American: 52 mL/min/{1.73_m2} — ABNORMAL LOW (ref >=60)
GLUCOSE: 183 mg/dL — AB (ref 65–99)
Globulin: 3.4 g/dL (calc) (ref 1.9–3.7)
POTASSIUM: 4.5 mmol/L (ref 3.5–5.3)
Sodium: 135 mmol/L (ref 135–146)
Total Protein: 7.5 g/dL (ref 6.1–8.1)

## 2017-07-01 LAB — HEMOGLOBIN A1C
EAG (MMOL/L): 12.5 (calc)
HEMOGLOBIN A1C: 9.5 %{Hb} — AB (ref <5.7)
Mean Plasma Glucose: 226 (calc)

## 2017-07-13 ENCOUNTER — Encounter: Payer: Self-pay | Admitting: "Endocrinology

## 2017-07-13 ENCOUNTER — Ambulatory Visit (INDEPENDENT_AMBULATORY_CARE_PROVIDER_SITE_OTHER): Payer: BLUE CROSS/BLUE SHIELD | Admitting: "Endocrinology

## 2017-07-13 VITALS — BP 126/70 | HR 76 | Ht 71.0 in | Wt 226.0 lb

## 2017-07-13 DIAGNOSIS — E118 Type 2 diabetes mellitus with unspecified complications: Secondary | ICD-10-CM

## 2017-07-13 DIAGNOSIS — E782 Mixed hyperlipidemia: Secondary | ICD-10-CM

## 2017-07-13 DIAGNOSIS — E1165 Type 2 diabetes mellitus with hyperglycemia: Secondary | ICD-10-CM

## 2017-07-13 DIAGNOSIS — IMO0002 Reserved for concepts with insufficient information to code with codable children: Secondary | ICD-10-CM

## 2017-07-13 DIAGNOSIS — Z6838 Body mass index (BMI) 38.0-38.9, adult: Secondary | ICD-10-CM

## 2017-07-13 DIAGNOSIS — I10 Essential (primary) hypertension: Secondary | ICD-10-CM

## 2017-07-13 MED ORDER — METFORMIN HCL 500 MG PO TABS: ORAL_TABLET | ORAL | 3 refills | Status: DC

## 2017-07-13 MED ORDER — INSULIN PEN NEEDLE 31G X 8 MM MISC: 1.0000 | 3 refills | Status: DC

## 2017-07-13 MED ORDER — FENOFIBRATE 145 MG PO TABS: 145.0000 mg | ORAL_TABLET | Freq: Every day | ORAL | 3 refills | Status: DC

## 2017-07-13 MED ORDER — INSULIN DEGLUDEC 100 UNIT/ML ~~LOC~~ SOPN: 20.0000 [IU] | PEN_INJECTOR | Freq: Every day | SUBCUTANEOUS | 2 refills | Status: DC

## 2017-07-13 MED ORDER — LISINOPRIL 10 MG PO TABS: 10.0000 mg | ORAL_TABLET | Freq: Every day | ORAL | 3 refills | Status: DC

## 2017-07-13 NOTE — Patient Instructions (Signed)

## 2017-07-13 NOTE — Progress Notes (Signed)
Subjective:    Patient ID: Javier Walker, male    DOB: [birth year]. Patient is being seen in f/u for management of diabetes, Hypertension, hyperlipidemia.   Past Medical History:  Diagnosis Date  . Diabetes mellitus without complication (HCC)   . GERD (gastroesophageal reflux disease)    occ-no meds  . Hives    patient has hives with anxiety  . Hyperlipemia   . Hypertension   . Shortness of breath    Past Surgical History:  Procedure Laterality Date  . CHOLECYSTECTOMY N/A 08/26/2016   Procedure: CHOLECYSTECTOMY;  Surgeon: Franky Macho, MD;  Location: [facility];  Service: General;  Laterality: N/A;  . COLON SURGERY  2011   gangrene lower colon with colostomy  . COLOSTOMY REVERSAL  2011  . ERCP N/A 08/17/2016   Procedure: ENDOSCOPIC RETROGRADE CHOLANGIOPANCREATOGRAPHY (ERCP) Biliary sphincterotomy and stone extraction;  Surgeon: Malissa Hippo, MD;  Location: [facility];  Service: Endoscopy;  Laterality: N/A;  . OPEN REDUCTION INTERNAL FIXATION (ORIF) TIBIA/FIBULA FRACTURE Right 08/30/2012   Procedure: OPEN REDUCTION INTERNAL FIXATION (ORIF) TIBIA/FIBULA FRACTURE;  Surgeon: Toni Arthurs, MD;  Location: [facility];  Service: Orthopedics;  Laterality: Right;  . WOUND DEBRIDEMENT  2011   abd-   Social History   Socioeconomic History  . Marital status: Married    Spouse name: Not on file  . Number of children: Not on file  . Years of education: Not on file  . Highest education level: Not on file  Occupational History  . Not on file  Social Needs  . Financial resource strain: Not on file  . Food insecurity:    Worry: Not on file    Inability: Not on file  . Transportation needs:    Medical: Not on file    Non-medical: Not on file  Tobacco Use  . Smoking status: Former Smoker    Packs/day: 2.00    Years: 15.00    Pack years: 30.00    Types: Cigarettes    Last attempt to quit: 08/27/1988    Years since quitting: 28.8  . Smokeless tobacco: Never Used   Substance and Sexual Activity  . Alcohol use: Yes    Comment: rarely  . Drug use: No  . Sexual activity: Yes    Birth control/protection: None  Lifestyle  . Physical activity:    Days per week: Not on file    Minutes per session: Not on file  . Stress: Not on file  Relationships  . Social connections:    Talks on phone: Not on file    Gets together: Not on file    Attends religious service: Not on file    Active member of club or organization: Not on file    Attends meetings of clubs or organizations: Not on file    Relationship status: Not on file  Other Topics Concern  . Not on file  Social History Narrative  . Not on file   Outpatient Encounter Medications as of 07/13/2017  Medication Sig  . aspirin 81 MG chewable tablet Chew 1 tablet (81 mg total) by mouth daily. Intended to hold for next 3 days, and resume on 08/23/2016  . fenofibrate (TRICOR) 145 MG tablet Take 1 tablet (145 mg total) by mouth daily.  . insulin degludec (TRESIBA FLEXTOUCH) 100 UNIT/ML SOPN FlexTouch Pen Inject 0.2 mLs (20 Units total) into the skin daily at 10 pm.  . Insulin Pen Needle (B-D ULTRAFINE III SHORT PEN) 31G X 8 MM  MISC 1 each by Does not apply route as directed.  [name] lisinopril (PRINIVIL,ZESTRIL) 10 MG tablet Take 1 tablet (10 mg total) by mouth daily.  . metFORMIN (GLUCOPHAGE) 500 MG tablet TAKE 1 TABLET BY MOUTH TWICE DAILY WITH A MEAL  . Omega-3 Fatty Acids (FISH OIL) 1200 MG CAPS Take 2,400 mg by mouth daily.  [name] oxyCODONE-acetaminophen (PERCOCET/ROXICET) 5-325 MG tablet Take 1-2 tablets by mouth every 4 (four) hours as needed for moderate pain.  . pantoprazole (PROTONIX) 40 MG tablet Take 1 tablet (40 mg total) by mouth 2 (two) times daily.  [name] saccharomyces boulardii (FLORASTOR) 250 MG capsule Take 1 capsule (250 mg total) by mouth 2 (two) times daily.  . [DISCONTINUED] dapagliflozin propanediol (FARXIGA) 5 MG TABS tablet Take 5 mg by mouth daily.  . [DISCONTINUED] fenofibrate (TRICOR) 145 MG  tablet TAKE 1 TABLET BY MOUTH ONCE DAILY  . [DISCONTINUED] lisinopril (PRINIVIL,ZESTRIL) 10 MG tablet TAKE 1 TABLET BY MOUTH ONCE DAILY  . [DISCONTINUED] metFORMIN (GLUCOPHAGE) 500 MG tablet TAKE 1 TABLET BY MOUTH TWICE DAILY WITH A MEAL   No facility-administered encounter medications on file as of 07/13/2017.    ALLERGIES: Allergies  Allergen Reactions  . Augmentin [Amoxicillin-Pot Clavulanate] Rash   VACCINATION STATUS:  There is no immunization history on file for this patient.  Diabetes  He presents for his follow-up diabetic visit. He has type 2 diabetes mellitus. Onset time: He was diagnosed at approximate age of 45 years. His disease course has been worsening. There are no hypoglycemic associated symptoms. Pertinent negatives for hypoglycemia include no confusion, headaches, pallor or seizures. Associated symptoms include blurred vision. Pertinent negatives for diabetes include no chest pain, no fatigue, no polydipsia, no polyphagia, no polyuria and no weakness. There are no hypoglycemic complications. Symptoms are worsening. There are no diabetic complications. Risk factors for coronary artery disease include diabetes mellitus, dyslipidemia, hypertension, male sex, obesity, tobacco exposure and sedentary lifestyle. Current diabetic treatment includes oral agent (dual therapy). His weight is increasing steadily. He is following a generally unhealthy diet. When asked about meal planning, he reported none. He has not had a previous visit with a dietitian. He never participates in exercise. (He does not monitor blood glucose, however came with higher A1c of 9.5%.) An ACE inhibitor/angiotensin II receptor blocker is being taken. Eye exam is current.  Hyperlipidemia  This is a chronic problem. The current episode started more than 1 year ago. Exacerbating diseases include diabetes and obesity. Pertinent negatives include no chest pain, myalgias or shortness of breath. Current antihyperlipidemic  treatment includes statins. Risk factors for coronary artery disease include diabetes mellitus, dyslipidemia, hypertension, male sex, obesity and a sedentary lifestyle.  Hypertension  This is a chronic problem. The current episode started more than 1 year ago. The problem is controlled. Associated symptoms include blurred vision. Pertinent negatives include no chest pain, headaches, neck pain, palpitations or shortness of breath. Risk factors for coronary artery disease include dyslipidemia, obesity, male gender, sedentary lifestyle and smoking/tobacco exposure. Past treatments include ACE inhibitors.   Review of Systems  Constitutional: Positive for unexpected weight change. Negative for chills, fatigue and fever.  HENT: Negative for dental problem, mouth sores and trouble swallowing.   Eyes: Positive for blurred vision. Negative for visual disturbance.  Respiratory: Negative for cough, choking, chest tightness, shortness of breath and wheezing.   Cardiovascular: Negative for chest pain, palpitations and leg swelling.  Gastrointestinal: Negative for abdominal distention, abdominal pain, constipation, diarrhea, nausea and vomiting.  Endocrine: Negative for polydipsia,  polyphagia and polyuria.  Genitourinary: Negative for dysuria, flank pain, hematuria and urgency.  Musculoskeletal: Negative for back pain, gait problem, myalgias and neck pain.  Skin: Negative for pallor, rash and wound.  Neurological: Negative for seizures, syncope, weakness, numbness and headaches.  Psychiatric/Behavioral: Negative.  Negative for confusion and dysphoric mood.    Objective:    BP 126/70   Pulse 76   Ht 5\' 11"  (1.803 m)   Wt 226 lb (102.5 kg)   BMI 31.52 kg/m   Wt Readings from Last 3 Encounters:  07/13/17 226 lb (102.5 kg)  04/12/17 223 lb (101.2 kg)  12/07/16 224 lb (101.6 kg)    Physical Exam  Constitutional: He is oriented to person, place, and time. He appears well-developed. He is cooperative. No  distress.  HENT:  Head: Normocephalic and atraumatic.  Eyes: EOM are normal.  Neck: Normal range of motion. Neck supple. No tracheal deviation present. No thyromegaly present.  Cardiovascular: Normal rate, S1 normal and S2 normal. Exam reveals no gallop.  No murmur heard. Pulses:      Dorsalis pedis pulses are 1+ on the right side, and 1+ on the left side.       Posterior tibial pulses are 1+ on the right side, and 1+ on the left side.  Pulmonary/Chest: Effort normal. No respiratory distress. He has no wheezes.  Abdominal: He exhibits no distension. There is no tenderness. There is no guarding and no CVA tenderness.  Musculoskeletal: He exhibits no edema.       Right shoulder: He exhibits no swelling and no deformity.  Neurological: He is alert and oriented to person, place, and time. He has normal strength and normal reflexes. No cranial nerve deficit or sensory deficit. Gait normal.  Skin: Skin is warm and dry. No rash noted. No cyanosis. Nails show no clubbing.  Psychiatric: He has a normal mood and affect. His speech is normal. Cognition and memory are normal.   CMP  Lipid Panel     Component Value Date/Time   CHOL 150 04/26/2016 0936   TRIG 189 (H) 04/26/2016 0936   HDL 27 (L) 04/26/2016 0936   CHOLHDL 5.6 (H) 04/26/2016 0936   VLDL 38 (H) 04/26/2016 0936   LDLCALC 85 04/26/2016 0936   Recent Results (from the past 2160 hour(s))  COMPLETE METABOLIC PANEL WITH GFR     Status: Abnormal   Collection Time: 06/30/17  7:18 AM  Result Value Ref Range   Glucose, Bld 183 (H) 65 - 99 mg/dL    Comment: .            Fasting reference interval . For someone without known diabetes, a glucose value >125 mg/dL indicates that they may have diabetes and this should be confirmed with a follow-up test. .    BUN 17 7 - 25 mg/dL   Creat 2.13 (H) 0.86 - 1.25 mg/dL    Comment: For patients >54 years of age, the reference limit for Creatinine is approximately 13% higher for  people identified as African-American. .    GFR, Est Non African American 52 (L) > OR = 60 mL/min/1.47m2   GFR, Est African American 60 > OR = 60 mL/min/1.55m2   BUN/Creatinine Ratio 12 6 - 22 (calc)   Sodium 135 135 - 146 mmol/L   Potassium 4.5 3.5 - 5.3 mmol/L   Chloride 101 98 - 110 mmol/L   CO2 28 20 - 32 mmol/L   Calcium 9.7 8.6 - 10.3 mg/dL   Total Protein  7.5 6.1 - 8.1 g/dL   Albumin 4.1 3.6 - 5.1 g/dL   Globulin 3.4 1.9 - 3.7 g/dL (calc)   AG Ratio 1.2 1.0 - 2.5 (calc)   Total Bilirubin 0.7 0.2 - 1.2 mg/dL   Alkaline phosphatase (APISO) 39 (L) 40 - 115 U/L   AST 31 10 - 35 U/L   ALT 36 9 - 46 U/L  Hemoglobin A1c     Status: Abnormal   Collection Time: 06/30/17  7:18 AM  Result Value Ref Range   Hgb A1c MFr Bld 9.5 (H) <5.7 % of total Hgb    Comment: For someone without known diabetes, a hemoglobin A1c value of 6.5% or greater indicates that they may have  diabetes and this should be confirmed with a follow-up  test. . For someone with known diabetes, a value <7% indicates  that their diabetes is well controlled and a value  greater than or equal to 7% indicates suboptimal  control. A1c targets should be individualized based on  duration of diabetes, age, comorbid conditions, and  other considerations. . Currently, no consensus exists regarding use of hemoglobin A1c for diagnosis of diabetes for children. .    Mean Plasma Glucose 226 (calc)   eAG (mmol/L) 12.5 (calc)      Assessment & Plan:   1. Uncontrolled type 2 diabetes mellitus with CKD - Patient has currently uncontrolled symptomatic type 2 DM since  [age] years of age. -  He admits to dietary indiscretion since last visit. His A1c is 9.5, progressively increasing from 7.3% .  - His diabetes is complicated by worsening CKD, obesity/sedentary life and patient remains at a high risk for more acute and chronic complications of diabetes which include CAD, CVA, CKD, retinopathy, and neuropathy. These are all  discussed in detail with the patient.  - I have counseled the patient on diet management and weight loss, by adopting a carbohydrate restricted/protein rich diet. -  Suggestion is made for him to avoid simple carbohydrates  from his diet including Cakes, Sweet Desserts / Pastries, Ice Cream, Soda (diet and regular), Sweet Tea, Candies, Chips, Cookies, Store Bought Juices, Alcohol in Excess of  1-2 drinks a day, Artificial Sweeteners, and "Sugar-free" Products. This will help patient to have stable blood glucose profile and potentially avoid unintended weight gain.  - I encouraged the patient to switch to  unprocessed or minimally processed complex starch and increased protein intake (animal or plant source), fruits, and vegetables.  - Patient is advised to stick to a routine mealtimes to eat 3 meals  a day and avoid unnecessary snacks ( to snack only to correct hypoglycemia).   - I have approached patient with the following individualized plan to manage diabetes and patient agrees:   - Based on his presentation with higher A1c of 9.5% related to dietary indiscretion, he is approached for basal insulin.  -He reluctantly accepts . -Discussed and demonstrated insulin use technique in the exam room, initiated Tresiba 20 units nightly associated with strict monitoring of blood glucose 4 times a day-before meals and at bedtime and return in 4 weeks with meter and logs for reevaluation.    - Due to worsening stage 2-3 renal insufficiency, I advised him to discontinue [place], may continue low-dose metformin at 500 mg p.o. twice daily.    - Patient specific target  A1c;  LDL, HDL, Triglycerides, and  Waist Circumference were discussed in detail.  2) BP/HTN: His blood pressure is controlled to target.   I  advised him to continue lisinopril 10 mg by mouth every morning. He has not been consistent with this medication.  3) Lipids/HPL: Controlled with LDL of 85,   he is taken off of statins due to  transaminitis. I advised him to stay off of statins until next fasting lipid panel, but will continue on fenofibrate 160 mg by mouth daily.   4)  Weight/Diet: CDE Consult in progress  , exercise, and detailed carbohydrates information provided.  5) Chronic Care/Health Maintenance:  -Patient is on ACEI/ARB and Statin medications and encouraged to continue to follow up with Ophthalmology, Podiatrist at least yearly or according to recommendations, and advised to   stay away from smoking. I have recommended yearly flu vaccine and pneumonia vaccination at least every 5 years; moderate intensity exercise for up to 150 minutes weekly; and  sleep for at least 7 hours a day.   - I advised patient to maintain close follow up with Assunta Found, MD for primary care needs. - Time spent with the patient: 25 min, of which >50% was spent in reviewing his current and  previous labs , medication options and developing a plan to avoid hypo- and hyper-glycemia. Please refer to Patient Instructions for Blood Glucose Monitoring and Insulin/Medications Dosing Guide"  in media tab for additional information. [name] participated in the discussions, expressed understanding, and voiced agreement with the above plans.  All questions were answered to his satisfaction. he is encouraged to contact clinic should he have any questions or concerns prior to his return visit.  Follow up plan: - Return in about 1 month (around 08/10/2017) for follow up with meter and logs- no labs.  Marquis Lunch, MD Phone: [phone]  Fax: [phone]  This note was partially dictated with voice recognition software. Similar sounding words can be transcribed inadequately or may not  be corrected upon review.  07/13/2017, 4:34 PM

## 2017-08-03 ENCOUNTER — Ambulatory Visit (INDEPENDENT_AMBULATORY_CARE_PROVIDER_SITE_OTHER): Payer: BLUE CROSS/BLUE SHIELD | Admitting: "Endocrinology

## 2017-08-03 ENCOUNTER — Encounter: Payer: Self-pay | Admitting: "Endocrinology

## 2017-08-03 VITALS — BP 153/82 | HR 70 | Ht 71.0 in | Wt 228.0 lb

## 2017-08-03 DIAGNOSIS — E118 Type 2 diabetes mellitus with unspecified complications: Secondary | ICD-10-CM | POA: Diagnosis not present

## 2017-08-03 DIAGNOSIS — E782 Mixed hyperlipidemia: Secondary | ICD-10-CM | POA: Diagnosis not present

## 2017-08-03 DIAGNOSIS — I10 Essential (primary) hypertension: Secondary | ICD-10-CM

## 2017-08-03 DIAGNOSIS — E1165 Type 2 diabetes mellitus with hyperglycemia: Secondary | ICD-10-CM | POA: Diagnosis not present

## 2017-08-03 DIAGNOSIS — Z6838 Body mass index (BMI) 38.0-38.9, adult: Secondary | ICD-10-CM | POA: Diagnosis not present

## 2017-08-03 DIAGNOSIS — IMO0002 Reserved for concepts with insufficient information to code with codable children: Secondary | ICD-10-CM

## 2017-08-03 DIAGNOSIS — E66812 Obesity, class 2: Secondary | ICD-10-CM

## 2017-08-03 MED ORDER — INSULIN DEGLUDEC 100 UNIT/ML ~~LOC~~ SOPN: 40.0000 [IU] | PEN_INJECTOR | Freq: Every day | SUBCUTANEOUS | 2 refills | Status: DC

## 2017-08-03 NOTE — Patient Instructions (Signed)

## 2017-08-03 NOTE — Progress Notes (Signed)
Subjective:    Patient ID: Javier Walker, male    DOB: 06-28-1950. Patient is being seen in f/u for management of diabetes, Hypertension, hyperlipidemia.   Past Medical History:  Diagnosis Date  . Diabetes mellitus without complication (HCC)   . GERD (gastroesophageal reflux disease)    occ-no meds  . Hives    patient has hives with anxiety  . Hyperlipemia   . Hypertension   . Shortness of breath    Past Surgical History:  Procedure Laterality Date  . CHOLECYSTECTOMY N/A 08/26/2016   Procedure: CHOLECYSTECTOMY;  Surgeon: Franky Macho, MD;  Location: [facility];  Service: General;  Laterality: N/A;  . COLON SURGERY  2011   gangrene lower colon with colostomy  . COLOSTOMY REVERSAL  2011  . ERCP N/A 08/17/2016   Procedure: ENDOSCOPIC RETROGRADE CHOLANGIOPANCREATOGRAPHY (ERCP) Biliary sphincterotomy and stone extraction;  Surgeon: Malissa Hippo, MD;  Location: [facility];  Service: Endoscopy;  Laterality: N/A;  . OPEN REDUCTION INTERNAL FIXATION (ORIF) TIBIA/FIBULA FRACTURE Right 08/30/2012   Procedure: OPEN REDUCTION INTERNAL FIXATION (ORIF) TIBIA/FIBULA FRACTURE;  Surgeon: Toni Arthurs, MD;  Location: [facility];  Service: Orthopedics;  Laterality: Right;  . WOUND DEBRIDEMENT  2011   abd-   Social History   Socioeconomic History  . Marital status: Married    Spouse name: Not on file  . Number of children: Not on file  . Years of education: Not on file  . Highest education level: Not on file  Occupational History  . Not on file  Social Needs  . Financial resource strain: Not on file  . Food insecurity:    Worry: Not on file    Inability: Not on file  . Transportation needs:    Medical: Not on file    Non-medical: Not on file  Tobacco Use  . Smoking status: Former Smoker    Packs/day: 2.00    Years: 15.00    Pack years: 30.00    Types: Cigarettes    Last attempt to quit: 08/27/1988    Years since quitting: 28.9  . Smokeless tobacco: Never Used   Substance and Sexual Activity  . Alcohol use: Yes    Comment: rarely  . Drug use: No  . Sexual activity: Yes    Birth control/protection: None  Lifestyle  . Physical activity:    Days per week: Not on file    Minutes per session: Not on file  . Stress: Not on file  Relationships  . Social connections:    Talks on phone: Not on file    Gets together: Not on file    Attends religious service: Not on file    Active member of club or organization: Not on file    Attends meetings of clubs or organizations: Not on file    Relationship status: Not on file  Other Topics Concern  . Not on file  Social History Narrative  . Not on file   Outpatient Encounter Medications as of 08/03/2017  Medication Sig  . aspirin 81 MG chewable tablet Chew 1 tablet (81 mg total) by mouth daily. Intended to hold for next 3 days, and resume on 08/23/2016  . fenofibrate (TRICOR) 145 MG tablet Take 1 tablet (145 mg total) by mouth daily.  . insulin degludec (TRESIBA FLEXTOUCH) 100 UNIT/ML SOPN FlexTouch Pen Inject 0.4 mLs (40 Units total) into the skin daily at 10 pm.  . Insulin Pen Needle (B-D ULTRAFINE III SHORT PEN) 31G X 8 MM  MISC 1 each by Does not apply route as directed.  [name] lisinopril (PRINIVIL,ZESTRIL) 10 MG tablet Take 1 tablet (10 mg total) by mouth daily.  . metFORMIN (GLUCOPHAGE) 500 MG tablet TAKE 1 TABLET BY MOUTH TWICE DAILY WITH A MEAL  . Omega-3 Fatty Acids (FISH OIL) 1200 MG CAPS Take 2,400 mg by mouth daily.  [name] oxyCODONE-acetaminophen (PERCOCET/ROXICET) 5-325 MG tablet Take 1-2 tablets by mouth every 4 (four) hours as needed for moderate pain.  . pantoprazole (PROTONIX) 40 MG tablet Take 1 tablet (40 mg total) by mouth 2 (two) times daily.  [name] saccharomyces boulardii (FLORASTOR) 250 MG capsule Take 1 capsule (250 mg total) by mouth 2 (two) times daily.  . [DISCONTINUED] insulin degludec (TRESIBA FLEXTOUCH) 100 UNIT/ML SOPN FlexTouch Pen Inject 0.2 mLs (20 Units total) into the skin daily at 10  pm. (Patient taking differently: Inject 30 Units into the skin daily at 10 pm. )   No facility-administered encounter medications on file as of 08/03/2017.    ALLERGIES: Allergies  Allergen Reactions  . Augmentin [Amoxicillin-Pot Clavulanate] Rash   VACCINATION STATUS:  There is no immunization history on file for this patient.  Diabetes  He presents for his follow-up diabetic visit. He has type 2 diabetes mellitus. Onset time: He was diagnosed at approximate age of 67 years. His disease course has been improving. There are no hypoglycemic associated symptoms. Pertinent negatives for hypoglycemia include no confusion, headaches, pallor or seizures. Associated symptoms include blurred vision. Pertinent negatives for diabetes include no chest pain, no fatigue, no polydipsia, no polyphagia, no polyuria and no weakness. There are no hypoglycemic complications. Symptoms are improving. There are no diabetic complications. Risk factors for coronary artery disease include diabetes mellitus, dyslipidemia, hypertension, male sex, obesity, tobacco exposure and sedentary lifestyle. Current diabetic treatment includes oral agent (dual therapy). His weight is increasing steadily. He is following a generally unhealthy diet. When asked about meal planning, he reported none. He has not had a previous visit with a dietitian. He never participates in exercise. His breakfast blood glucose range is generally 140-180 mg/dl. His Walker blood glucose range is generally 140-180 mg/dl. His dinner blood glucose range is generally 140-180 mg/dl. His bedtime blood glucose range is generally 140-180 mg/dl. His overall blood glucose range is 140-180 mg/dl. An ACE inhibitor/angiotensin II receptor blocker is being taken. Eye exam is current.  Hyperlipidemia  This is a chronic problem. The current episode started more than 1 year ago. The problem is uncontrolled. Exacerbating diseases include diabetes and obesity. Pertinent negatives  include no chest pain, myalgias or shortness of breath. Current antihyperlipidemic treatment includes statins. Risk factors for coronary artery disease include diabetes mellitus, dyslipidemia, hypertension, male sex, obesity and a sedentary lifestyle.  Hypertension  This is a chronic problem. The current episode started more than 1 year ago. The problem is controlled. Associated symptoms include blurred vision. Pertinent negatives include no chest pain, headaches, neck pain, palpitations or shortness of breath. Risk factors for coronary artery disease include dyslipidemia, obesity, male gender, sedentary lifestyle and smoking/tobacco exposure. Past treatments include ACE inhibitors.   Review of Systems  Constitutional: Positive for unexpected weight change. Negative for chills, fatigue and fever.  HENT: Negative for dental problem, mouth sores and trouble swallowing.   Eyes: Positive for blurred vision. Negative for visual disturbance.  Respiratory: Negative for cough, choking, chest tightness, shortness of breath and wheezing.   Cardiovascular: Negative for chest pain, palpitations and leg swelling.  Gastrointestinal: Negative for abdominal distention, abdominal pain,  constipation, diarrhea, nausea and vomiting.  Endocrine: Negative for polydipsia, polyphagia and polyuria.  Genitourinary: Negative for dysuria, flank pain, hematuria and urgency.  Musculoskeletal: Negative for back pain, gait problem, myalgias and neck pain.  Skin: Negative for pallor, rash and wound.  Neurological: Negative for seizures, syncope, weakness, numbness and headaches.  Psychiatric/Behavioral: Negative.  Negative for confusion and dysphoric mood.    Objective:    BP (!) 153/82   Pulse 70   Ht 5\' 11"  (1.803 m)   Wt 228 lb (103.4 kg)   BMI 31.80 kg/m   Wt Readings from Last 3 Encounters:  08/03/17 228 lb (103.4 kg)  07/13/17 226 lb (102.5 kg)  04/12/17 223 lb (101.2 kg)    Physical Exam  Constitutional: He  is oriented to person, place, and time. He appears well-developed. He is cooperative. No distress.  HENT:  Head: Normocephalic and atraumatic.  Eyes: EOM are normal.  Neck: Normal range of motion. Neck supple. No tracheal deviation present. No thyromegaly present.  Cardiovascular: Normal rate, S1 normal and S2 normal. Exam reveals no gallop.  No murmur heard. Pulses:      Dorsalis pedis pulses are 1+ on the right side, and 1+ on the left side.       Posterior tibial pulses are 1+ on the right side, and 1+ on the left side.  Pulmonary/Chest: Effort normal. No respiratory distress. He has no wheezes.  Abdominal: He exhibits no distension. There is no tenderness. There is no guarding and no CVA tenderness.  Musculoskeletal: He exhibits no edema.       Right shoulder: He exhibits no swelling and no deformity.  Neurological: He is alert and oriented to person, place, and time. He has normal strength and normal reflexes. No cranial nerve deficit or sensory deficit. Gait normal.  Skin: Skin is warm and dry. No rash noted. No cyanosis. Nails show no clubbing.  Psychiatric: He has a normal mood and affect. His speech is normal. Cognition and memory are normal.   CMP  Lipid Panel     Component Value Date/Time   CHOL 150 04/26/2016 0936   TRIG 189 (H) 04/26/2016 0936   HDL 27 (L) 04/26/2016 0936   CHOLHDL 5.6 (H) 04/26/2016 0936   VLDL 38 (H) 04/26/2016 0936   LDLCALC 85 04/26/2016 0936   Recent Results (from the past 2160 hour(s))  COMPLETE METABOLIC PANEL WITH GFR     Status: Abnormal   Collection Time: 06/30/17  7:18 AM  Result Value Ref Range   Glucose, Bld 183 (H) 65 - 99 mg/dL    Comment: .            Fasting reference interval . For someone without known diabetes, a glucose value >125 mg/dL indicates that they may have diabetes and this should be confirmed with a follow-up test. .    BUN 17 7 - 25 mg/dL   Creat 1.61 (H) 0.96 - 1.25 mg/dL    Comment: For patients >49 years of  age, the reference limit for Creatinine is approximately 13% higher for people identified as African-American. .    GFR, Est Non African American 52 (L) > OR = 60 mL/min/1.27m2   GFR, Est African American 60 > OR = 60 mL/min/1.50m2   BUN/Creatinine Ratio 12 6 - 22 (calc)   Sodium 135 135 - 146 mmol/L   Potassium 4.5 3.5 - 5.3 mmol/L   Chloride 101 98 - 110 mmol/L   CO2 28 20 - 32 mmol/L  Calcium 9.7 8.6 - 10.3 mg/dL   Total Protein 7.5 6.1 - 8.1 g/dL   Albumin 4.1 3.6 - 5.1 g/dL   Globulin 3.4 1.9 - 3.7 g/dL (calc)   AG Ratio 1.2 1.0 - 2.5 (calc)   Total Bilirubin 0.7 0.2 - 1.2 mg/dL   Alkaline phosphatase (APISO) 39 (L) 40 - 115 U/L   AST 31 10 - 35 U/L   ALT 36 9 - 46 U/L  Hemoglobin A1c     Status: Abnormal   Collection Time: 06/30/17  7:18 AM  Result Value Ref Range   Hgb A1c MFr Bld 9.5 (H) <5.7 % of total Hgb    Comment: For someone without known diabetes, a hemoglobin A1c value of 6.5% or greater indicates that they may have  diabetes and this should be confirmed with a follow-up  test. . For someone with known diabetes, a value <7% indicates  that their diabetes is well controlled and a value  greater than or equal to 7% indicates suboptimal  control. A1c targets should be individualized based on  duration of diabetes, age, comorbid conditions, and  other considerations. . Currently, no consensus exists regarding use of hemoglobin A1c for diagnosis of diabetes for children. .    Mean Plasma Glucose 226 (calc)   eAG (mmol/L) 12.5 (calc)      Assessment & Plan:   1. Uncontrolled type 2 diabetes mellitus with CKD - Patient has currently uncontrolled symptomatic type 2 DM since  67 years of age. -  He returns with significantly improved glycemic profile both fasting and postprandial.  He has responded to his basal insulin initiated due to his A1c of 9.5% prior to his last visit.   - His diabetes is complicated by worsening CKD, obesity/sedentary life and  patient remains at a high risk for more acute and chronic complications of diabetes which include CAD, CVA, CKD, retinopathy, and neuropathy. These are all discussed in detail with the patient.  - I have counseled the patient on diet management and weight loss, by adopting a carbohydrate restricted/protein rich diet.  -  Suggestion is made for him to avoid simple carbohydrates  from his diet including Cakes, Sweet Desserts / Pastries, Ice Cream, Soda (diet and regular), Sweet Tea, Candies, Chips, Cookies, Store Bought Juices, Alcohol in Excess of  1-2 drinks a day, Artificial Sweeteners, and "Sugar-free" Products. This will help patient to have stable blood glucose profile and potentially avoid unintended weight gain.   - I encouraged the patient to switch to  unprocessed or minimally processed complex starch and increased protein intake (animal or plant source), fruits, and vegetables.  - Patient is advised to stick to a routine mealtimes to eat 3 meals  a day and avoid unnecessary snacks ( to snack only to correct hypoglycemia).   - I have approached patient with the following individualized plan to manage diabetes and patient agrees:   -Has responded to his basal insulin very well with controlled fasting and postprandial glycemic profile   -I advised him to increase [place] to 40 units nightly associated with strict monitoring of blood glucose 2 times a day-before breakfast and at bedtime.    -I advised him to continue low-dose metformin at 500 mg p.o. twice daily.    - Patient specific target  A1c;  LDL, HDL, Triglycerides, and  Waist Circumference were discussed in detail.  2) BP/HTN: His blood pressure is not  controlled to target.   I advised him to continue lisinopril 10  mg by mouth every morning. He has not been consistent with this medication.  3) Lipids/HPL: Controlled with LDL of 85,   he is taken off of statins due to transaminitis. I advised him to stay off of statins until next  fasting lipid panel, but will continue on fenofibrate 160 mg by mouth daily.   4)  Weight/Diet: CDE Consult in progress  , exercise, and detailed carbohydrates information provided.  5) Chronic Care/Health Maintenance:  -Patient is on ACEI/ARB and Statin medications and encouraged to continue to follow up with Ophthalmology, Podiatrist at least yearly or according to recommendations, and advised to   stay away from smoking. I have recommended yearly flu vaccine and pneumonia vaccination at least every 5 years; moderate intensity exercise for up to 150 minutes weekly; and  sleep for at least 7 hours a day.  - Time spent with the patient: 25 min, of which >50% was spent in reviewing his blood glucose logs , discussing his hypo- and hyper-glycemic episodes, reviewing his current and  previous labs and insulin doses and developing a plan to avoid hypo- and hyper-glycemia. Please refer to Patient Instructions for Blood Glucose Monitoring and Insulin/Medications Dosing Guide"  in media tab for additional information. Javier SchatzWilliam Walker participated in the discussions, expressed understanding, and voiced agreement with the above plans.  All questions were answered to his satisfaction. he is encouraged to contact clinic should he have any questions or concerns prior to his return visit.  Follow up plan: - Return in about 3 months (around 11/02/2017) for follow up with pre-visit labs, meter, and logs.  Javier LunchGebre Prajwal Fellner, MD Phone: [phone]  Fax: [phone]  This note was partially dictated with voice recognition software. Similar sounding words can be transcribed inadequately or may not  be corrected upon review.  08/03/2017, 5:02 PM

## 2017-08-07 ENCOUNTER — Emergency Department (HOSPITAL_COMMUNITY)
Admission: EM | Admit: 2017-08-07 | Discharge: 2017-08-07 | Disposition: A | Discharge status: Home / Self Care | Payer: BLUE CROSS/BLUE SHIELD | Attending: Emergency Medicine | Admitting: Emergency Medicine

## 2017-08-07 ENCOUNTER — Emergency Department (HOSPITAL_COMMUNITY): Payer: BLUE CROSS/BLUE SHIELD

## 2017-08-07 ENCOUNTER — Telehealth: Payer: Self-pay | Admitting: "Endocrinology

## 2017-08-07 ENCOUNTER — Encounter (HOSPITAL_COMMUNITY): Payer: Self-pay | Admitting: Emergency Medicine

## 2017-08-07 ENCOUNTER — Other Ambulatory Visit: Payer: Self-pay

## 2017-08-07 DIAGNOSIS — Z87891 Personal history of nicotine dependence: Secondary | ICD-10-CM | POA: Insufficient documentation

## 2017-08-07 DIAGNOSIS — Z79899 Other long term (current) drug therapy: Secondary | ICD-10-CM | POA: Insufficient documentation

## 2017-08-07 DIAGNOSIS — E119 Type 2 diabetes mellitus without complications: Secondary | ICD-10-CM | POA: Insufficient documentation

## 2017-08-07 DIAGNOSIS — I10 Essential (primary) hypertension: Secondary | ICD-10-CM | POA: Diagnosis not present

## 2017-08-07 DIAGNOSIS — M79645 Pain in left finger(s): Secondary | ICD-10-CM | POA: Diagnosis not present

## 2017-08-07 DIAGNOSIS — Z794 Long term (current) use of insulin: Secondary | ICD-10-CM | POA: Diagnosis not present

## 2017-08-07 DIAGNOSIS — M79642 Pain in left hand: Secondary | ICD-10-CM | POA: Diagnosis not present

## 2017-08-07 DIAGNOSIS — M65352 Trigger finger, left little finger: Secondary | ICD-10-CM

## 2017-08-07 MED ORDER — INSULIN DEGLUDEC 100 UNIT/ML ~~LOC~~ SOPN: 40.0000 [IU] | PEN_INJECTOR | Freq: Every day | SUBCUTANEOUS | 2 refills | Status: DC

## 2017-08-07 NOTE — Telephone Encounter (Signed)
Rx sent 

## 2017-08-07 NOTE — Telephone Encounter (Signed)
Javier Walker is asking if we would go ahead and call in the new Rx of insulin degludec (TRESIBA FLEXTOUCH) 100 UNIT/ML SOPN FlexTouch Pens since it was changed from 20 units to 40, please sent to [facility] in [place]

## 2017-08-07 NOTE — Discharge Instructions (Signed)
As we discussed, take Tylenol or ibuprofen to help with inflammation.  Apply heat to the affected area.   Follow-up with referred orthopedic or hand surgeon for further evaluation.  Call their offices tomorrow and see if he can arrange for an appointment.  Return to the emergency department for any fevers, redness or swelling of the finger, drainage from the finger, numbness or weakness or any other worsening or concerning symptoms.

## 2017-08-07 NOTE — ED Triage Notes (Signed)
Pt c/o left ring/pinky finger swelling x 30 minutes. Pt denies any injury or pain to the area.

## 2017-08-08 NOTE — ED Provider Notes (Signed)
[facility] EMERGENCY DEPARTMENT Provider Note   CSN: [id number] Arrival date & time: 08/07/17  1907     History   Chief Complaint Chief Complaint  Patient presents with  . Hand Pain    HPI Javier Walker is a 67 y.o. male with PMH/o DM, GERD, HTN who presents for evaluation of left 4th and 5th finger deformity that began today.  Patient reports that prior to ED arrival, patient started having some difficulty straightening the fourth and fifth fingers.  Patient reports that the areas at the base of the fingers are "tense" but do not hurt.  He states that he has noticed some mild swelling at the bases but no overlying warmth, erythema.  Patient reports that he has difficulty straightening the year secondary to symptoms.  He denies any preceding trauma, injury, fall.  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 Patient denies any fever, warmth, redness.  The history is provided by the patient.    Past Medical History:  Diagnosis Date  . Diabetes mellitus without complication (HCC)   . GERD (gastroesophageal reflux disease)    occ-no meds  . Hives    patient has hives with anxiety  . Hyperlipemia   . Hypertension   . Shortness of breath     Patient Active Problem List   Diagnosis Date Noted  . Mixed hyperlipidemia 12/07/2016  . S/P cholecystectomy 08/26/2016  . Calculus of gallbladder without cholecystitis without obstruction   . Cholangitis 08/19/2016  . Calculus of gallbladder and bile duct with obstruction without cholecystitis   . Sepsis (HCC) 08/16/2016  . AKI (acute kidney injury) (HCC) 08/16/2016  . Dehydration 08/16/2016  . Elevated LFTs 08/16/2016  . Jaundice 08/16/2016  . UTI (urinary tract infection) 08/16/2016  . Uncontrolled type 2 diabetes mellitus with complication, without long-term current use of insulin (HCC) 03/01/2016  . Essential hypertension, benign 03/01/2016  . Class 2 severe obesity due to excess  calories with serious comorbidity and body mass index (BMI) of 38.0 to 38.9 in adult [facility]) 03/01/2016    Past Surgical History:  Procedure Laterality Date  . CHOLECYSTECTOMY N/A 08/26/2016   Procedure: CHOLECYSTECTOMY;  Surgeon: Franky Macho, MD;  Location: [facility] ORS;  Service: General;  Laterality: N/A;  . COLON SURGERY  2011   gangrene lower colon with colostomy  . COLOSTOMY REVERSAL  2011  . ERCP N/A 08/17/2016   Procedure: ENDOSCOPIC RETROGRADE CHOLANGIOPANCREATOGRAPHY (ERCP) Biliary sphincterotomy and stone extraction;  Surgeon: Malissa Hippo, MD;  Location: [facility] END[facility]ITE;  Service: Endoscopy;  Laterality: N/A;  . OPEN REDUCTION INTERNAL FIXATION (ORIF) TIBIA/FIBULA FRACTURE Right 08/30/2012   Procedure: OPEN REDUCTION INTERNAL FIXATION (ORIF) TIBIA/FIBULA FRACTURE;  Surgeon: Toni Arthurs, MD;  Location: [facility];  Service: Orthopedics;  Laterality: Right;  . WOUND DEBRIDEMENT  2011   abd-        Home Medications    Prior to Admission medications   Medication Sig Start Date End Date Taking? Authorizing Provider  aspirin 81 MG chewable tablet Chew 1 tablet (81 mg total) by mouth daily. Intended to hold for next 3 days, and resume on 08/23/2016 08/23/16   Elgergawy, Leana Roe, MD  fenofibrate (TRICOR) 145 MG tablet Take 1 tablet (145 mg total) by mouth daily. 07/13/17   Roma Kayser, MD  insulin degludec (TRESIBA FLEXTOUCH) 100 UNIT/ML SOPN FlexTouch Pen Inject 0.4 mLs (40 Units total) into the skin daily at 10 pm. 08/07/17  Roma Kayser, MD  Insulin Pen Needle (B-D ULTRAFINE III SHORT PEN) 31G X 8 MM MISC 1 each by Does not apply route as directed. 07/13/17   Roma Kayser, MD  lisinopril (PRINIVIL,ZESTRIL) 10 MG tablet Take 1 tablet (10 mg total) by mouth daily. 07/13/17   Roma Kayser, MD  metFORMIN (GLUCOPHAGE) 500 MG tablet TAKE 1 TABLET BY MOUTH TWICE DAILY WITH A MEAL 07/13/17   Nida, Denman George, MD  Omega-3 Fatty Acids (FISH OIL)  1200 MG CAPS Take 2,400 mg by mouth daily.    [provider]  oxyCODONE-acetaminophen (PERCOCET/ROXICET) 5-325 MG tablet Take 1-2 tablets by mouth every 4 (four) hours as needed for moderate pain. 08/27/16   Franky Macho, MD  pantoprazole (PROTONIX) 40 MG tablet Take 1 tablet (40 mg total) by mouth 2 (two) times daily. 08/19/16   Elgergawy, Leana Roe, MD  saccharomyces boulardii (FLORASTOR) 250 MG capsule Take 1 capsule (250 mg total) by mouth 2 (two) times daily. 08/19/16   Elgergawy, Leana Roe, MD    Family History Family History  Problem Relation Age of Onset  . Heart attack Mother   . Heart attack Father     Social History Social History   Tobacco Use  . Smoking status: Former Smoker    Packs/day: 2.00    Years: 15.00    Pack years: 30.00    Types: Cigarettes    Last attempt to quit: 08/27/1988    Years since quitting: 28.9  . Smokeless tobacco: Never Used  Substance Use Topics  . Alcohol use: Yes    Comment: rarely  . Drug use: No     Allergies   Augmentin [amoxicillin-pot clavulanate]   Review of Systems Review of Systems  Constitutional: Negative for fever.  Skin: Negative for color change.  Neurological: Negative for weakness and numbness.     Physical Exam Updated Vital Signs BP (!) 159/72 (BP Location: Left Arm)   Pulse 70   Temp 97.9 F (36.6 C) (Oral)   Resp 16   Ht  (1.803 m)   Wt 103.4 kg (228 lb)   SpO2 97%   BMI 31.80 kg/m   Physical Exam  Constitutional: He appears well-developed and well-nourished.  HENT:  Head: Normocephalic and atraumatic.  Eyes: Conjunctivae and EOM are normal. Right eye exhibits no discharge. Left eye exhibits no discharge. No scleral icterus.  Cardiovascular:  Pulses:      Radial pulses are 2+ on the right side, and 2+ on the left side.  Pulmonary/Chest: Effort normal.  Musculoskeletal:  Left fifth digit is slightly flexed, most notably at PIP but there is evidence of thickening at the base of the  MCP of the left fifth digit.  Additionally, patient has some thickening noted to the base of the fourth MCP.  Did not slightly flex but has better range of motion and can almost fully extend the finger.  No overlying warmth, erythema.  Neurological: He is alert.  Sensation intact along major nerve distributions of BUE  Skin: Skin is warm and dry.  Psychiatric: He has a normal mood and affect. His speech is normal and behavior is normal.  Nursing note and vitals reviewed.    ED Treatments / Results  Labs (all labs ordered are listed, but only abnormal results are displayed) Labs Reviewed - No data to display  EKG None  Radiology Dg Hand Complete Left  Result Date: 08/07/2017 CLINICAL DATA:  Cannot straighten fingers.  No pain or injury.  EXAM: LEFT HAND - COMPLETE 3+ VIEW COMPARISON:  None. FINDINGS: Relative flexion of the fourth and fifth fingers without underlying fracture or dislocation. No acute soft tissue finding. Nonspecific intercarpal cystic change. No erosive changes or joint narrowing. Arterial calcification. IMPRESSION: No acute osseous finding. Electronically Signed   By: Marnee Spring M.D.   On: 08/07/2017 20:25    Procedures Procedures (including critical care time)  Medications Ordered in ED Medications - No data to display   Initial Impression / Assessment and Plan / ED Course  I have reviewed the triage vital signs and the nursing notes.  Pertinent labs & imaging results that were available during my care of the patient were reviewed by me and considered in my medical decision making (see chart for details).     67 year old male who presents for evaluation of difficulty moving left fourth and fifth digit.  Reports that symptoms began approximately 30 minutes prior to ED arrival.  No preceding trauma, injury, fall.  Patient reports some "swelling" at the base of the fourth and fifth digit.  Has not had any fevers, warmth, erythema. Patient is afebrile, non-toxic  appearing, sitting comfortably on examination table. Vital signs reviewed and stable. Patient is neurovascularly intact.  On exam, fifth digit is flexed, most notably at PIP and there appears to be some thickening at the base of the MCP.  Additionally, fourth fingers slightly flexed, most can extend better than the fifth digit.  Consider trigger finger versus Dupuytren's contracture.  History/physical exam is not concerning for flexor tenosynovitis as there is overlying warmth, erythema.  Patient with no fever.  X-rays ordered at triage for further evaluation.  X-rays reviewed.  Negative for any acute bony abnormality.  Discussed with patient that this may be trigger finger versus Dupuytren's contracture.  Encouraged at home supportive care measures.  Will plan to have patient follow-up with orthopedics for further interventions. Modified splint placed for supportive care. Patient had ample opportunity for questions and discussion. All patient's questions were answered with full understanding. Strict return precautions discussed. Patient expresses understanding and agreement to plan.   Final Clinical Impressions(s) / ED Diagnoses   Final diagnoses:  Trigger little finger of left hand    ED Discharge Orders    None       Rosana Hoes 08/08/17 0125    Marily Memos, MD 08/08/17 2250

## 2017-08-16 ENCOUNTER — Other Ambulatory Visit: Payer: Self-pay | Admitting: "Endocrinology

## 2017-08-21 ENCOUNTER — Telehealth: Payer: Self-pay | Admitting: "Endocrinology

## 2017-08-21 ENCOUNTER — Other Ambulatory Visit: Payer: Self-pay | Admitting: "Endocrinology

## 2017-08-21 MED ORDER — INSULIN GLARGINE 300 UNIT/ML ~~LOC~~ SOPN: 40.0000 [IU] | PEN_INJECTOR | Freq: Every day | SUBCUTANEOUS | 2 refills | Status: DC

## 2017-08-21 NOTE — Telephone Encounter (Signed)
Whichever one covered by his insurance is a good alternative to [place] including Lantus, Toujeo, Levemir, or  Hospital doctor.  I guess we will not know until we prescribe. He did not have any prior issues with the other kinds.  I will send a prescription for Toujeo.

## 2017-08-21 NOTE — Telephone Encounter (Signed)
Insurance will not cover tresiba. Wasn't sure what you may want to change to or if this pt had issues with others insulins?

## 2017-08-21 NOTE — Telephone Encounter (Signed)
Javier Walker is asking for a refill on insulin degludec (TRESIBA FLEXTOUCH) 100 UNIT/ML SOPN FlexTouch Pen 90 days sent to [facility]

## 2017-08-22 ENCOUNTER — Other Ambulatory Visit: Payer: Self-pay

## 2017-08-22 MED ORDER — INSULIN DEGLUDEC 100 UNIT/ML ~~LOC~~ SOPN: 40.0000 [IU] | PEN_INJECTOR | Freq: Every day | SUBCUTANEOUS | 3 refills | Status: DC

## 2017-08-25 ENCOUNTER — Telehealth: Payer: Self-pay | Admitting: "Endocrinology

## 2017-08-25 ENCOUNTER — Other Ambulatory Visit: Payer: Self-pay | Admitting: "Endocrinology

## 2017-08-25 MED ORDER — INSULIN PEN NEEDLE 31G X 8 MM MISC: 1.0000 | 3 refills | Status: DC

## 2017-08-25 NOTE — Telephone Encounter (Signed)
Javier Walker is asking for a Rx for the needles that goes on the end of his Javier Walker Pens called to [facility] [place], please advise?

## 2017-08-28 MED ORDER — INSULIN PEN NEEDLE 31G X 8 MM MISC: 1.0000 | 3 refills | Status: DC

## 2017-08-31 ENCOUNTER — Other Ambulatory Visit: Payer: Self-pay

## 2017-08-31 MED ORDER — METFORMIN HCL 500 MG PO TABS: ORAL_TABLET | ORAL | 3 refills | Status: DC

## 2017-09-09 ENCOUNTER — Encounter: Payer: Self-pay | Admitting: "Endocrinology

## 2017-10-09 ENCOUNTER — Encounter: Payer: Self-pay | Admitting: "Endocrinology

## 2017-10-13 DIAGNOSIS — E118 Type 2 diabetes mellitus with unspecified complications: Secondary | ICD-10-CM | POA: Diagnosis not present

## 2017-10-13 DIAGNOSIS — E1165 Type 2 diabetes mellitus with hyperglycemia: Secondary | ICD-10-CM | POA: Diagnosis not present

## 2017-10-14 LAB — COMPLETE METABOLIC PANEL WITH GFR
AG Ratio: 1.5 (calc) (ref 1.0–2.5)
ALKALINE PHOSPHATASE (APISO): 37 U/L — AB (ref 40–115)
ALT: 36 U/L (ref 9–46)
AST: 25 U/L (ref 10–35)
Albumin: 4.1 g/dL (ref 3.6–5.1)
BUN/Creatinine Ratio: 18 (calc) (ref 6–22)
BUN: 23 mg/dL (ref 7–25)
CALCIUM: 9.2 mg/dL (ref 8.6–10.3)
CO2: 28 mmol/L (ref 20–32)
Chloride: 101 mmol/L (ref 98–110)
Creat: 1.27 mg/dL — ABNORMAL HIGH (ref 0.70–1.25)
GFR, Est African American: 68 mL/min/{1.73_m2} (ref >=60)
GFR, Est Non African American: 58 mL/min/{1.73_m2} — ABNORMAL LOW (ref >=60)
GLUCOSE: 168 mg/dL — AB (ref 65–99)
Globulin: 2.8 g/dL (calc) (ref 1.9–3.7)
Potassium: 4.7 mmol/L (ref 3.5–5.3)
Sodium: 136 mmol/L (ref 135–146)
Total Bilirubin: 0.8 mg/dL (ref 0.2–1.2)
Total Protein: 6.9 g/dL (ref 6.1–8.1)

## 2017-10-14 LAB — HEMOGLOBIN A1C
Hgb A1c MFr Bld: 7.7 % of total Hgb — ABNORMAL HIGH (ref <5.7)
MEAN PLASMA GLUCOSE: 174 (calc)
eAG (mmol/L): 9.7 (calc)

## 2017-11-02 ENCOUNTER — Encounter: Payer: Self-pay | Admitting: "Endocrinology

## 2017-11-02 ENCOUNTER — Ambulatory Visit (INDEPENDENT_AMBULATORY_CARE_PROVIDER_SITE_OTHER): Payer: BLUE CROSS/BLUE SHIELD | Admitting: "Endocrinology

## 2017-11-02 ENCOUNTER — Other Ambulatory Visit: Payer: Self-pay | Admitting: "Endocrinology

## 2017-11-02 VITALS — BP 137/73 | HR 71 | Ht 71.0 in | Wt 230.0 lb

## 2017-11-02 DIAGNOSIS — N183 Chronic kidney disease, stage 3 (moderate): Secondary | ICD-10-CM

## 2017-11-02 DIAGNOSIS — E782 Mixed hyperlipidemia: Secondary | ICD-10-CM

## 2017-11-02 DIAGNOSIS — Z794 Long term (current) use of insulin: Secondary | ICD-10-CM | POA: Diagnosis not present

## 2017-11-02 DIAGNOSIS — E1122 Type 2 diabetes mellitus with diabetic chronic kidney disease: Secondary | ICD-10-CM

## 2017-11-02 DIAGNOSIS — I10 Essential (primary) hypertension: Secondary | ICD-10-CM | POA: Diagnosis not present

## 2017-11-02 MED ORDER — METFORMIN HCL 500 MG PO TABS: ORAL_TABLET | ORAL | 2 refills | Status: DC

## 2017-11-02 MED ORDER — INSULIN GLARGINE 300 UNIT/ML ~~LOC~~ SOPN: 40.0000 [IU] | PEN_INJECTOR | Freq: Every day | SUBCUTANEOUS | 2 refills | Status: DC

## 2017-11-02 MED ORDER — INSULIN PEN NEEDLE 31G X 8 MM MISC: 1.0000 | 3 refills | Status: DC

## 2017-11-02 NOTE — Progress Notes (Signed)
Endocrinology follow-up note  Subjective:    Patient ID: Javier SchatzWilliam Joaquin, male    DOB: 05-04-1950. Patient is being seen in f/u for management of diabetes, Hypertension, hyperlipidemia.   Past Medical History:  Diagnosis Date  . Diabetes mellitus without complication (HCC)   . GERD (gastroesophageal reflux disease)    occ-no meds  . Hives    patient has hives with anxiety  . Hyperlipemia   . Hypertension   . Shortness of breath    Past Surgical History:  Procedure Laterality Date  . CHOLECYSTECTOMY N/A 08/26/2016   Procedure: CHOLECYSTECTOMY;  Surgeon: Franky MachoJenkins, Mark, MD;  Location: [facility];  Service: General;  Laterality: N/A;  . COLON SURGERY  2011   gangrene lower colon with colostomy  . COLOSTOMY REVERSAL  2011  . ERCP N/A 08/17/2016   Procedure: ENDOSCOPIC RETROGRADE CHOLANGIOPANCREATOGRAPHY (ERCP) Biliary sphincterotomy and stone extraction;  Surgeon: Malissa Hippoehman, Najeeb U, MD;  Location: [facility];  Service: Endoscopy;  Laterality: N/A;  . OPEN REDUCTION INTERNAL FIXATION (ORIF) TIBIA/FIBULA FRACTURE Right 08/30/2012   Procedure: OPEN REDUCTION INTERNAL FIXATION (ORIF) TIBIA/FIBULA FRACTURE;  Surgeon: Toni ArthursJohn Hewitt, MD;  Location: [facility];  Service: Orthopedics;  Laterality: Right;  . WOUND DEBRIDEMENT  2011   abd-   Social History   Socioeconomic History  . Marital status: Married    Spouse name: Not on file  . Number of children: Not on file  . Years of education: Not on file  . Highest education level: Not on file  Occupational History  . Not on file  Social Needs  . Financial resource strain: Not on file  . Food insecurity:    Worry: Not on file    Inability: Not on file  . Transportation needs:    Medical: Not on file    Non-medical: Not on file  Tobacco Use  . Smoking status: Former Smoker    Packs/day: 2.00    Years: 15.00    Pack years: 30.00    Types: Cigarettes    Last attempt to quit: 08/27/1988    Years since quitting: 29.2  .  Smokeless tobacco: Never Used  Substance and Sexual Activity  . Alcohol use: Yes    Comment: rarely  . Drug use: No  . Sexual activity: Yes    Birth control/protection: None  Lifestyle  . Physical activity:    Days per week: Not on file    Minutes per session: Not on file  . Stress: Not on file  Relationships  . Social connections:    Talks on phone: Not on file    Gets together: Not on file    Attends religious service: Not on file    Active member of club or organization: Not on file    Attends meetings of clubs or organizations: Not on file    Relationship status: Not on file  Other Topics Concern  . Not on file  Social History Narrative  . Not on file   Outpatient Encounter Medications as of 11/02/2017  Medication Sig  . aspirin 81 MG chewable tablet Chew 1 tablet (81 mg total) by mouth daily. Intended to hold for next 3 days, and resume on 08/23/2016  . fenofibrate (TRICOR) 145 MG tablet Take 1 tablet (145 mg total) by mouth daily.  . Insulin Glargine (TOUJEO SOLOSTAR) 300 UNIT/ML SOPN Inject 40 Units into the skin at bedtime.  . Insulin Pen Needle (B-D ULTRAFINE III SHORT PEN) 31G X 8 MM MISC 1 each by Does  not apply route as directed.  [name] lisinopril (PRINIVIL,ZESTRIL) 10 MG tablet Take 1 tablet (10 mg total) by mouth daily.  . metFORMIN (GLUCOPHAGE) 500 MG tablet TAKE 1 TABLET BY MOUTH TWICE DAILY WITH A MEAL  . Omega-3 Fatty Acids (FISH OIL) 1200 MG CAPS Take 2,400 mg by mouth daily.  [name] oxyCODONE-acetaminophen (PERCOCET/ROXICET) 5-325 MG tablet Take 1-2 tablets by mouth every 4 (four) hours as needed for moderate pain.  . pantoprazole (PROTONIX) 40 MG tablet Take 1 tablet (40 mg total) by mouth 2 (two) times daily.  [name] saccharomyces boulardii (FLORASTOR) 250 MG capsule Take 1 capsule (250 mg total) by mouth 2 (two) times daily.  . [DISCONTINUED] Insulin Glargine (TOUJEO SOLOSTAR) 300 UNIT/ML SOPN Inject 40 Units into the skin at bedtime.  . [DISCONTINUED] Insulin Pen Needle  (B-D ULTRAFINE III SHORT PEN) 31G X 8 MM MISC 1 each by Does not apply route as directed.  . [DISCONTINUED] metFORMIN (GLUCOPHAGE) 500 MG tablet TAKE 1 TABLET BY MOUTH TWICE DAILY WITH A MEAL   No facility-administered encounter medications on file as of 11/02/2017.    ALLERGIES: Allergies  Allergen Reactions  . Augmentin [Amoxicillin-Pot Clavulanate] Rash   VACCINATION STATUS:  There is no immunization history on file for this patient.  Diabetes  He presents for his follow-up diabetic visit. He has type 2 diabetes mellitus. Onset time: He was diagnosed at approximate age of 61 years. His disease course has been improving. There are no hypoglycemic associated symptoms. Pertinent negatives for hypoglycemia include no confusion, headaches, pallor or seizures. Associated symptoms include blurred vision. Pertinent negatives for diabetes include no chest pain, no fatigue, no polydipsia, no polyphagia, no polyuria and no weakness. There are no hypoglycemic complications. Symptoms are improving. There are no diabetic complications. Risk factors for coronary artery disease include diabetes mellitus, dyslipidemia, hypertension, male sex, obesity, tobacco exposure and sedentary lifestyle. Current diabetic treatment includes oral agent (dual therapy). He is compliant with treatment most of the time. His weight is stable. He is following a generally unhealthy diet. When asked about meal planning, he reported none. He has not had a previous visit with a dietitian. He never participates in exercise. His breakfast blood glucose range is generally 140-180 mg/dl. His bedtime blood glucose range is generally 140-180 mg/dl. His overall blood glucose range is 140-180 mg/dl. An ACE inhibitor/angiotensin II receptor blocker is being taken. Eye exam is current.  Hyperlipidemia  This is a chronic problem. The current episode started more than 1 year ago. The problem is uncontrolled. Exacerbating diseases include diabetes  and obesity. Pertinent negatives include no chest pain, myalgias or shortness of breath. Current antihyperlipidemic treatment includes statins. Risk factors for coronary artery disease include diabetes mellitus, dyslipidemia, hypertension, male sex, obesity and a sedentary lifestyle.  Hypertension  This is a chronic problem. The current episode started more than 1 year ago. The problem is controlled. Associated symptoms include blurred vision. Pertinent negatives include no chest pain, headaches, neck pain, palpitations or shortness of breath. Risk factors for coronary artery disease include dyslipidemia, obesity, male gender, sedentary lifestyle and smoking/tobacco exposure. Past treatments include ACE inhibitors.   Review of Systems  Constitutional: Positive for unexpected weight change. Negative for chills, fatigue and fever.  HENT: Negative for dental problem, mouth sores and trouble swallowing.   Eyes: Positive for blurred vision. Negative for visual disturbance.  Respiratory: Negative for cough, choking, chest tightness, shortness of breath and wheezing.   Cardiovascular: Negative for chest pain, palpitations and leg swelling.  Gastrointestinal: Negative for abdominal distention, abdominal pain, constipation, diarrhea, nausea and vomiting.  Endocrine: Negative for polydipsia, polyphagia and polyuria.  Genitourinary: Negative for dysuria, flank pain, hematuria and urgency.  Musculoskeletal: Negative for back pain, gait problem, myalgias and neck pain.  Skin: Negative for pallor, rash and wound.  Neurological: Negative for seizures, syncope, weakness, numbness and headaches.  Psychiatric/Behavioral: Negative.  Negative for confusion and dysphoric mood.    Objective:    BP 137/73   Pulse 71   Ht 5\' 11"  (1.803 m)   Wt 230 lb (104.3 kg)   BMI 32.08 kg/m   Wt Readings from Last 3 Encounters:  11/02/17 230 lb (104.3 kg)  08/07/17 228 lb (103.4 kg)  08/03/17 228 lb (103.4 kg)     Physical Exam  Constitutional: He is oriented to person, place, and time. He appears well-developed. He is cooperative. No distress.  HENT:  Head: Normocephalic and atraumatic.  Eyes: EOM are normal.  Neck: Normal range of motion. Neck supple. No tracheal deviation present. No thyromegaly present.  Cardiovascular: Normal rate, S1 normal and S2 normal. Exam reveals no gallop.  No murmur heard. Pulses:      Dorsalis pedis pulses are 1+ on the right side, and 1+ on the left side.       Posterior tibial pulses are 1+ on the right side, and 1+ on the left side.  Pulmonary/Chest: Effort normal. No respiratory distress. He has no wheezes.  Abdominal: He exhibits no distension. There is no tenderness. There is no guarding and no CVA tenderness.  Musculoskeletal: He exhibits deformity. He exhibits no edema.       Right shoulder: He exhibits no swelling and no deformity.  Dupuytren's contracture of left upper extremity  Neurological: He is alert and oriented to person, place, and time. He has normal strength and normal reflexes. No cranial nerve deficit or sensory deficit. Gait normal.  Skin: Skin is warm and dry. No rash noted. No cyanosis. Nails show no clubbing.  Psychiatric: He has a normal mood and affect. His speech is normal. Cognition and memory are normal.   CMP  Lipid Panel     Component Value Date/Time   CHOL 150 04/26/2016 0936   TRIG 189 (H) 04/26/2016 0936   HDL 27 (L) 04/26/2016 0936   CHOLHDL 5.6 (H) 04/26/2016 0936   VLDL 38 (H) 04/26/2016 0936   LDLCALC 85 04/26/2016 0936   Recent Results (from the past 2160 hour(s))  COMPLETE METABOLIC PANEL WITH GFR     Status: Abnormal   Collection Time: 10/13/17  7:33 AM  Result Value Ref Range   Glucose, Bld 168 (H) 65 - 99 mg/dL    Comment: .            Fasting reference interval . For someone without known diabetes, a glucose value >125 mg/dL indicates that they may have diabetes and this should be confirmed with a follow-up  test. .    BUN 23 7 - 25 mg/dL   Creat 1.61 (H) 0.96 - 1.25 mg/dL    Comment: For patients >43 years of age, the reference limit for Creatinine is approximately 13% higher for people identified as African-American. .    GFR, Est Non African American 58 (L) > OR = 60 mL/min/1.12m2   GFR, Est African American 68 > OR = 60 mL/min/1.59m2   BUN/Creatinine Ratio 18 6 - 22 (calc)   Sodium 136 135 - 146 mmol/L   Potassium 4.7 3.5 - 5.3 mmol/L  Chloride 101 98 - 110 mmol/L   CO2 28 20 - 32 mmol/L   Calcium 9.2 8.6 - 10.3 mg/dL   Total Protein 6.9 6.1 - 8.1 g/dL   Albumin 4.1 3.6 - 5.1 g/dL   Globulin 2.8 1.9 - 3.7 g/dL (calc)   AG Ratio 1.5 1.0 - 2.5 (calc)   Total Bilirubin 0.8 0.2 - 1.2 mg/dL   Alkaline phosphatase (APISO) 37 (L) 40 - 115 U/L   AST 25 10 - 35 U/L   ALT 36 9 - 46 U/L  Hemoglobin A1c     Status: Abnormal   Collection Time: 10/13/17  7:33 AM  Result Value Ref Range   Hgb A1c MFr Bld 7.7 (H) <5.7 % of total Hgb    Comment: For someone without known diabetes, a hemoglobin A1c value of 6.5% or greater indicates that they may have  diabetes and this should be confirmed with a follow-up  test. . For someone with known diabetes, a value <7% indicates  that their diabetes is well controlled and a value  greater than or equal to 7% indicates suboptimal  control. A1c targets should be individualized based on  duration of diabetes, age, comorbid conditions, and  other considerations. . Currently, no consensus exists regarding use of hemoglobin A1c for diagnosis of diabetes for children. .    Mean Plasma Glucose 174 (calc)   eAG (mmol/L) 9.7 (calc)      Assessment & Plan:   1. Uncontrolled type 2 diabetes mellitus with CKD - Patient has currently uncontrolled symptomatic type 2 DM since  67 years of age. -  He returns with significantly improved glycemic profile both fasting and postprandial.  He has responded to his basal insulin, with A1c improvement to 7.7%  from 9.5%.   - His diabetes is complicated by worsening CKD, obesity/sedentary life and patient remains at a high risk for more acute and chronic complications of diabetes which include CAD, CVA, CKD, retinopathy, and neuropathy. These are all discussed in detail with the patient.  - I have counseled the patient on diet management and weight loss, by adopting a carbohydrate restricted/protein rich diet.  -  Suggestion is made for him to avoid simple carbohydrates  from his diet including Cakes, Sweet Desserts / Pastries, Ice Cream, Soda (diet and regular), Sweet Tea, Candies, Chips, Cookies, Store Bought Juices, Alcohol in Excess of  1-2 drinks a day, Artificial Sweeteners, and "Sugar-free" Products. This will help patient to have stable blood glucose profile and potentially avoid unintended weight gain.  - I encouraged the patient to switch to  unprocessed or minimally processed complex starch and increased protein intake (animal or plant source), fruits, and vegetables.  - Patient is advised to stick to a routine mealtimes to eat 3 meals  a day and avoid unnecessary snacks ( to snack only to correct hypoglycemia).   - I have approached patient with the following individualized plan to manage diabetes and patient agrees:   -Has responded to his basal insulin very well with controlled fasting and postprandial glycemic profile   -I advised him to continue [place] to 40 units nightly associated with strict monitoring of blood glucose 2 times a day-before breakfast and at bedtime.   -I advised him to continue low-dose metformin at 500 mg p.o. twice daily.    - Patient specific target  A1c;  LDL, HDL, Triglycerides, and  Waist Circumference were discussed in detail.  2) BP/HTN: His blood pressure is not  controlled to target.  I advised him to continue lisinopril 10 mg by mouth every morning. He has not been consistent with this medication.  3) Lipids/HPL: Controlled with LDL of 85,   he is taken  off of statins due to transaminitis. I advised him to stay off of statins until next fasting lipid panel, but will continue on fenofibrate 160 mg by mouth daily.   4)  Weight/Diet: CDE Consult in progress  , exercise, and detailed carbohydrates information provided.  5) Chronic Care/Health Maintenance:  -Patient is on ACEI and Statin medications and encouraged to continue to follow up with Ophthalmology, Podiatrist at least yearly or according to recommendations, and advised to   stay away from smoking. I have recommended yearly flu vaccine and pneumonia vaccination at least every 5 years; moderate intensity exercise for up to 150 minutes weekly; and  sleep for at least 7 hours a day.  - Time spent with the patient: 25 min, of which >50% was spent in reviewing his blood glucose logs , discussing his hypo- and hyper-glycemic episodes, reviewing his current and  previous labs and insulin doses and developing a plan to avoid hypo- and hyper-glycemia. Please refer to Patient Instructions for Blood Glucose Monitoring and Insulin/Medications Dosing Guide"  in media tab for additional information. Javier Walker participated in the discussions, expressed understanding, and voiced agreement with the above plans.  All questions were answered to his satisfaction. he is encouraged to contact clinic should he have any questions or concerns prior to his return visit.   Follow up plan: - Return in about 4 months (around 03/05/2018) for meter, and logs, follow up with pre-visit labs, meter, and logs.  Marquis Lunch, MD Phone: [phone]  Fax: [phone]  This note was partially dictated with voice recognition software. Similar sounding words can be transcribed inadequately or may not  be corrected upon review.  11/02/2017, 5:47 PM

## 2017-11-02 NOTE — Patient Instructions (Signed)

## 2017-11-21 ENCOUNTER — Other Ambulatory Visit: Payer: Self-pay | Admitting: "Endocrinology

## 2017-12-19 ENCOUNTER — Other Ambulatory Visit: Payer: Self-pay | Admitting: "Endocrinology

## 2017-12-20 ENCOUNTER — Other Ambulatory Visit: Payer: Self-pay

## 2017-12-20 MED ORDER — FENOFIBRATE 145 MG PO TABS: 145.0000 mg | ORAL_TABLET | Freq: Every day | ORAL | 3 refills | Status: DC

## 2018-01-29 ENCOUNTER — Other Ambulatory Visit: Payer: Self-pay | Admitting: "Endocrinology

## 2018-02-27 DIAGNOSIS — E1165 Type 2 diabetes mellitus with hyperglycemia: Secondary | ICD-10-CM | POA: Diagnosis not present

## 2018-02-27 DIAGNOSIS — E1122 Type 2 diabetes mellitus with diabetic chronic kidney disease: Secondary | ICD-10-CM | POA: Diagnosis not present

## 2018-02-27 DIAGNOSIS — E782 Mixed hyperlipidemia: Secondary | ICD-10-CM | POA: Diagnosis not present

## 2018-02-27 DIAGNOSIS — N183 Chronic kidney disease, stage 3 (moderate): Secondary | ICD-10-CM | POA: Diagnosis not present

## 2018-02-27 DIAGNOSIS — Z794 Long term (current) use of insulin: Secondary | ICD-10-CM | POA: Diagnosis not present

## 2018-02-28 LAB — LIPID PANEL
CHOL/HDL RATIO: 7.9 (calc) — AB (ref <5.0)
Cholesterol: 173 mg/dL (ref <200)
HDL: 22 mg/dL — ABNORMAL LOW (ref >40)
NON-HDL CHOLESTEROL (CALC): 151 mg/dL — AB (ref <130)
Triglycerides: 582 mg/dL — ABNORMAL HIGH (ref <150)

## 2018-02-28 LAB — COMPLETE METABOLIC PANEL WITH GFR
AG Ratio: 1.3 (calc) (ref 1.0–2.5)
ALBUMIN MSPROF: 4.1 g/dL (ref 3.6–5.1)
ALT: 25 U/L (ref 9–46)
AST: 27 U/L (ref 10–35)
Alkaline phosphatase (APISO): 39 U/L — ABNORMAL LOW (ref 40–115)
BUN: 20 mg/dL (ref 7–25)
CO2: 26 mmol/L (ref 20–32)
CREATININE: 1.22 mg/dL (ref 0.70–1.25)
Calcium: 9.5 mg/dL (ref 8.6–10.3)
Chloride: 101 mmol/L (ref 98–110)
GFR, EST AFRICAN AMERICAN: 71 mL/min/{1.73_m2} (ref >=60)
GFR, EST NON AFRICAN AMERICAN: 61 mL/min/{1.73_m2} (ref >=60)
GLOBULIN: 3.1 g/dL (ref 1.9–3.7)
GLUCOSE: 192 mg/dL — AB (ref 65–99)
Potassium: 4.5 mmol/L (ref 3.5–5.3)
SODIUM: 136 mmol/L (ref 135–146)
TOTAL PROTEIN: 7.2 g/dL (ref 6.1–8.1)
Total Bilirubin: 0.5 mg/dL (ref 0.2–1.2)

## 2018-02-28 LAB — MICROALBUMIN / CREATININE URINE RATIO
Creatinine, Urine: 132 mg/dL (ref 20–320)
MICROALB UR: 1 mg/dL
MICROALB/CREAT RATIO: 8 ug/mg{creat} (ref <30)

## 2018-02-28 LAB — TSH: TSH: 3.24 mIU/L (ref 0.40–4.50)

## 2018-02-28 LAB — VITAMIN D 25 HYDROXY (VIT D DEFICIENCY, FRACTURES): Vit D, 25-Hydroxy: 10 ng/mL — ABNORMAL LOW (ref 30–100)

## 2018-02-28 LAB — T4, FREE: FREE T4: 1 ng/dL (ref 0.8–1.8)

## 2018-02-28 LAB — HEMOGLOBIN A1C
EAG (MMOL/L): 11.1 (calc)
Hgb A1c MFr Bld: 8.6 % of total Hgb — ABNORMAL HIGH (ref <5.7)
MEAN PLASMA GLUCOSE: 200 (calc)

## 2018-03-12 ENCOUNTER — Encounter: Payer: Self-pay | Admitting: "Endocrinology

## 2018-03-12 ENCOUNTER — Ambulatory Visit (INDEPENDENT_AMBULATORY_CARE_PROVIDER_SITE_OTHER): Payer: Medicare Other | Admitting: "Endocrinology

## 2018-03-12 VITALS — BP 136/78 | HR 73 | Ht 71.0 in | Wt 234.0 lb

## 2018-03-12 DIAGNOSIS — Z794 Long term (current) use of insulin: Secondary | ICD-10-CM

## 2018-03-12 DIAGNOSIS — N183 Chronic kidney disease, stage 3 (moderate): Secondary | ICD-10-CM | POA: Diagnosis not present

## 2018-03-12 DIAGNOSIS — E782 Mixed hyperlipidemia: Secondary | ICD-10-CM

## 2018-03-12 DIAGNOSIS — E1122 Type 2 diabetes mellitus with diabetic chronic kidney disease: Secondary | ICD-10-CM | POA: Diagnosis not present

## 2018-03-12 DIAGNOSIS — E559 Vitamin D deficiency, unspecified: Secondary | ICD-10-CM

## 2018-03-12 DIAGNOSIS — I10 Essential (primary) hypertension: Secondary | ICD-10-CM | POA: Diagnosis not present

## 2018-03-12 MED ORDER — VITAMIN D3 125 MCG (5000 UT) PO CAPS: 5000.0000 [IU] | ORAL_CAPSULE | Freq: Every day | ORAL | 0 refills | Status: DC

## 2018-03-12 MED ORDER — INSULIN DEGLUDEC 100 UNIT/ML ~~LOC~~ SOPN: 50.0000 [IU] | PEN_INJECTOR | Freq: Every day | SUBCUTANEOUS | 3 refills | Status: DC

## 2018-03-12 NOTE — Patient Instructions (Signed)

## 2018-03-12 NOTE — Progress Notes (Signed)
Endocrinology follow-up note  Subjective:    Patient ID: Javier Walker, male    DOB: 13-Aug-1950. Patient is being seen in f/u for management of diabetes, Hypertension, hyperlipidemia.   Past Medical History:  Diagnosis Date  . Diabetes mellitus without complication (HCC)   . GERD (gastroesophageal reflux disease)    occ-no meds  . Hives    patient has hives with anxiety  . Hyperlipemia   . Hypertension   . Shortness of breath    Past Surgical History:  Procedure Laterality Date  . CHOLECYSTECTOMY N/A 08/26/2016   Procedure: CHOLECYSTECTOMY;  Surgeon: Franky MachoJenkins, Mark, MD;  Location: [facility];  Service: General;  Laterality: N/A;  . COLON SURGERY  2011   gangrene lower colon with colostomy  . COLOSTOMY REVERSAL  2011  . ERCP N/A 08/17/2016   Procedure: ENDOSCOPIC RETROGRADE CHOLANGIOPANCREATOGRAPHY (ERCP) Biliary sphincterotomy and stone extraction;  Surgeon: Malissa Hippoehman, Najeeb U, MD;  Location: [facility];  Service: Endoscopy;  Laterality: N/A;  . OPEN REDUCTION INTERNAL FIXATION (ORIF) TIBIA/FIBULA FRACTURE Right 08/30/2012   Procedure: OPEN REDUCTION INTERNAL FIXATION (ORIF) TIBIA/FIBULA FRACTURE;  Surgeon: Toni ArthursJohn Hewitt, MD;  Location: [facility];  Service: Orthopedics;  Laterality: Right;  . WOUND DEBRIDEMENT  2011   abd-   Social History   Socioeconomic History  . Marital status: Married    Spouse name: Not on file  . Number of children: Not on file  . Years of education: Not on file  . Highest education level: Not on file  Occupational History  . Not on file  Social Needs  . Financial resource strain: Not on file  . Food insecurity:    Worry: Not on file    Inability: Not on file  . Transportation needs:    Medical: Not on file    Non-medical: Not on file  Tobacco Use  . Smoking status: Former Smoker    Packs/day: 2.00    Years: 15.00    Pack years: 30.00    Types: Cigarettes    Last attempt to quit: 08/27/1988    Years since quitting: 29.5  .  Smokeless tobacco: Never Used  Substance and Sexual Activity  . Alcohol use: Yes    Comment: rarely  . Drug use: No  . Sexual activity: Yes    Birth control/protection: None  Lifestyle  . Physical activity:    Days per week: Not on file    Minutes per session: Not on file  . Stress: Not on file  Relationships  . Social connections:    Talks on phone: Not on file    Gets together: Not on file    Attends religious service: Not on file    Active member of club or organization: Not on file    Attends meetings of clubs or organizations: Not on file    Relationship status: Not on file  Other Topics Concern  . Not on file  Social History Narrative  . Not on file   Outpatient Encounter Medications as of 03/12/2018  Medication Sig  . aspirin 81 MG chewable tablet Chew 1 tablet (81 mg total) by mouth daily. Intended to hold for next 3 days, and resume on 08/23/2016  . Cholecalciferol (VITAMIN D3) 125 MCG (5000 UT) CAPS Take 1 capsule (5,000 Units total) by mouth daily.  . fenofibrate (TRICOR) 145 MG tablet Take 1 tablet (145 mg total) by mouth daily.  . insulin degludec (TRESIBA FLEXTOUCH) 100 UNIT/ML SOPN FlexTouch Pen Inject 0.5 mLs (50 Units total)  into the skin at bedtime.  . Insulin Pen Needle (B-D ULTRAFINE III SHORT PEN) 31G X 8 MM MISC 1 each by Does not apply route as directed.  [name] lisinopril (PRINIVIL,ZESTRIL) 10 MG tablet TAKE 1 TABLET BY MOUTH ONCE DAILY  . metFORMIN (GLUCOPHAGE) 500 MG tablet TAKE 1 TABLET BY MOUTH TWICE DAILY WITH A MEAL  . Omega-3 Fatty Acids (FISH OIL) 1200 MG CAPS Take 2,400 mg by mouth daily.  [name] saccharomyces boulardii (FLORASTOR) 250 MG capsule Take 1 capsule (250 mg total) by mouth 2 (two) times daily.  . [DISCONTINUED] oxyCODONE-acetaminophen (PERCOCET/ROXICET) 5-325 MG tablet Take 1-2 tablets by mouth every 4 (four) hours as needed for moderate pain.  . [DISCONTINUED] pantoprazole (PROTONIX) 40 MG tablet Take 1 tablet (40 mg total) by mouth 2 (two) times  daily.  . [DISCONTINUED] TOUJEO SOLOSTAR 300 UNIT/ML SOPN INJECT 40 UNITS INTO THE SKIN AT BEDTIME.  . [DISCONTINUED] TRESIBA FLEXTOUCH 100 UNIT/ML SOPN FlexTouch Pen INJECT 0.4 MLS (40 UNITS TOTAL) INTO THE SKIN AT BEDTIME.   No facility-administered encounter medications on file as of 03/12/2018.    ALLERGIES: Allergies  Allergen Reactions  . Augmentin [Amoxicillin-Pot Clavulanate] Rash   VACCINATION STATUS:  There is no immunization history on file for this patient.  Diabetes  He presents for his follow-up diabetic visit. He has type 2 diabetes mellitus. Onset time: He was diagnosed at approximate age of 42 years. His disease course has been worsening. There are no hypoglycemic associated symptoms. Pertinent negatives for hypoglycemia include no confusion, headaches, pallor or seizures. Pertinent negatives for diabetes include no blurred vision, no chest pain, no fatigue, no polydipsia, no polyphagia, no polyuria and no weakness. There are no hypoglycemic complications. Symptoms are worsening. There are no diabetic complications. Risk factors for coronary artery disease include diabetes mellitus, dyslipidemia, hypertension, male sex, obesity, tobacco exposure and sedentary lifestyle. Current diabetic treatment includes oral agent (dual therapy). He is compliant with treatment most of the time. His weight is stable. He is following a generally unhealthy diet. When asked about meal planning, he reported none. He has not had a previous visit with a dietitian. He never participates in exercise. His breakfast blood glucose range is generally 140-180 mg/dl. His bedtime blood glucose range is generally 140-180 mg/dl. His overall blood glucose range is 140-180 mg/dl. An ACE inhibitor/angiotensin II receptor blocker is being taken. Eye exam is current.  Hyperlipidemia  This is a chronic problem. The current episode started more than 1 year ago. The problem is uncontrolled. Exacerbating diseases include  diabetes and obesity. Pertinent negatives include no chest pain, myalgias or shortness of breath. Current antihyperlipidemic treatment includes statins. Risk factors for coronary artery disease include diabetes mellitus, dyslipidemia, hypertension, male sex, obesity and a sedentary lifestyle.  Hypertension  This is a chronic problem. The current episode started more than 1 year ago. The problem is controlled. Pertinent negatives include no blurred vision, chest pain, headaches, neck pain, palpitations or shortness of breath. Risk factors for coronary artery disease include dyslipidemia, obesity, male gender, sedentary lifestyle and smoking/tobacco exposure. Past treatments include ACE inhibitors.   Review of Systems  Constitutional: Negative for chills, fatigue, fever and unexpected weight change.  HENT: Negative for dental problem, mouth sores and trouble swallowing.   Eyes: Negative for blurred vision and visual disturbance.  Respiratory: Negative for cough, choking, chest tightness, shortness of breath and wheezing.   Cardiovascular: Negative for chest pain, palpitations and leg swelling.  Gastrointestinal: Negative for abdominal distention, abdominal pain, constipation,  diarrhea, nausea and vomiting.  Endocrine: Negative for polydipsia, polyphagia and polyuria.  Genitourinary: Negative for dysuria, flank pain, hematuria and urgency.  Musculoskeletal: Negative for back pain, gait problem, myalgias and neck pain.  Skin: Negative for pallor, rash and wound.  Neurological: Negative for seizures, syncope, weakness, numbness and headaches.  Psychiatric/Behavioral: Negative for confusion and dysphoric mood.    Objective:    BP 136/78   Pulse 73   Ht 5\' 11"  (1.803 m)   Wt 234 lb (106.1 kg)   BMI 32.64 kg/m   Wt Readings from Last 3 Encounters:  03/12/18 234 lb (106.1 kg)  11/02/17 230 lb (104.3 kg)  08/07/17 228 lb (103.4 kg)    Physical Exam  Constitutional: He is oriented to person,  place, and time. He appears well-developed. He is cooperative. No distress.  HENT:  Head: Normocephalic and atraumatic.  Eyes: EOM are normal.  Neck: Normal range of motion. Neck supple. No tracheal deviation present. No thyromegaly present.  Cardiovascular: Normal rate, S1 normal and S2 normal. Exam reveals no gallop.  No murmur heard. Pulses:      Dorsalis pedis pulses are 1+ on the right side, and 1+ on the left side.       Posterior tibial pulses are 1+ on the right side, and 1+ on the left side.  Pulmonary/Chest: Effort normal. No respiratory distress. He has no wheezes.  Abdominal: He exhibits no distension. There is no tenderness. There is no guarding and no CVA tenderness.  Musculoskeletal: He exhibits no edema or deformity.       Right shoulder: He exhibits no swelling and no deformity.  Dupuytren's contracture of left upper extremity  Neurological: He is alert and oriented to person, place, and time. He has normal strength and normal reflexes. No cranial nerve deficit or sensory deficit. Gait normal.  Skin: Skin is warm and dry. No rash noted. No cyanosis. Nails show no clubbing.  Psychiatric: He has a normal mood and affect. His speech is normal. Cognition and memory are normal.   CMP  Lipid Panel     Component Value Date/Time   CHOL 173 02/27/2018 0709   TRIG 582 (H) 02/27/2018 0709   HDL 22 (L) 02/27/2018 0709   CHOLHDL 7.9 (H) 02/27/2018 0709   VLDL 38 (H) 04/26/2016 0936   LDLCALC  02/27/2018 0709     Comment:     . LDL cholesterol not calculated. Triglyceride levels greater than 400 mg/dL invalidate calculated LDL results. . Reference range: <100 . Desirable range <100 mg/dL for primary prevention;   <70 mg/dL for patients with CHD or diabetic patients  with > or = 2 CHD risk factors. [name] LDL-C is now calculated using the Martin-Hopkins  calculation, which is a validated novel method providing  better accuracy than the Friedewald equation in the  estimation of  LDL-C.  Horald Pollen et al. Lenox Ahr. 6295;284(13): 2061-2068  (http://education.[facility].com/faq/FAQ164)    Recent Results (from the past 2160 hour(s))  COMPLETE METABOLIC PANEL WITH GFR     Status: Abnormal   Collection Time: 02/27/18  7:09 AM  Result Value Ref Range   Glucose, Bld 192 (H) 65 - 99 mg/dL    Comment: .            Fasting reference interval . For someone without known diabetes, a glucose value >125 mg/dL indicates that they may have diabetes and this should be confirmed with a follow-up test. .    BUN 20 7 - 25 mg/dL  Creat 1.22 0.70 - 1.25 mg/dL    Comment: For patients >36 years of age, the reference limit for Creatinine is approximately 13% higher for people identified as African-American. .    GFR, Est Non African American 61 > OR = 60 mL/min/1.68m2   GFR, Est African American 71 > OR = 60 mL/min/1.63m2   BUN/Creatinine Ratio NOT APPLICABLE 6 - 22 (calc)   Sodium 136 135 - 146 mmol/L   Potassium 4.5 3.5 - 5.3 mmol/L   Chloride 101 98 - 110 mmol/L   CO2 26 20 - 32 mmol/L   Calcium 9.5 8.6 - 10.3 mg/dL   Total Protein 7.2 6.1 - 8.1 g/dL   Albumin 4.1 3.6 - 5.1 g/dL   Globulin 3.1 1.9 - 3.7 g/dL (calc)   AG Ratio 1.3 1.0 - 2.5 (calc)   Total Bilirubin 0.5 0.2 - 1.2 mg/dL   Alkaline phosphatase (APISO) 39 (L) 40 - 115 U/L   AST 27 10 - 35 U/L   ALT 25 9 - 46 U/L  Hemoglobin A1c     Status: Abnormal   Collection Time: 02/27/18  7:09 AM  Result Value Ref Range   Hgb A1c MFr Bld 8.6 (H) <5.7 % of total Hgb    Comment: For someone without known diabetes, a hemoglobin A1c value of 6.5% or greater indicates that they may have  diabetes and this should be confirmed with a follow-up  test. . For someone with known diabetes, a value <7% indicates  that their diabetes is well controlled and a value  greater than or equal to 7% indicates suboptimal  control. A1c targets should be individualized based on  duration of diabetes, age, comorbid conditions, and   other considerations. . Currently, no consensus exists regarding use of hemoglobin A1c for diagnosis of diabetes for children. .    Mean Plasma Glucose 200 (calc)   eAG (mmol/L) 11.1 (calc)  Lipid panel     Status: Abnormal   Collection Time: 02/27/18  7:09 AM  Result Value Ref Range   Cholesterol 173 <200 mg/dL   HDL 22 (L) >16 mg/dL   Triglycerides 109 (H) <150 mg/dL    Comment: . If a non-fasting specimen was collected, consider repeat triglyceride testing on a fasting specimen if clinically indicated.  Perry Mount et al. J. of Clin. Lipidol. 2015;9:129-169. . . There is increased risk of pancreatitis when the  triglyceride concentration is very high  (> or = 500 mg/dL, especially if > or = 6045 mg/dL).  Perry Mount et al. J. of Clin. Lipidol. 2015;9:129-169. [name]    LDL Cholesterol (Calc)  mg/dL (calc)    Comment: . LDL cholesterol not calculated. Triglyceride levels greater than 400 mg/dL invalidate calculated LDL results. . Reference range: <100 . Desirable range <100 mg/dL for primary prevention;   <70 mg/dL for patients with CHD or diabetic patients  with > or = 2 CHD risk factors. [name] LDL-C is now calculated using the Martin-Hopkins  calculation, which is a validated novel method providing  better accuracy than the Friedewald equation in the  estimation of LDL-C.  Horald Pollen et al. Lenox Ahr. 4098;119(14): 2061-2068  (http://education.[facility].com/faq/FAQ164)    Total CHOL/HDL Ratio 7.9 (H) <5.0 (calc)   Non-HDL Cholesterol (Calc) 151 (H) <130 mg/dL (calc)    Comment: For patients with diabetes plus 1 major ASCVD risk  factor, treating to a non-HDL-C goal of <100 mg/dL  (LDL-C of <78 mg/dL) is considered a therapeutic  option.   Microalbumin / creatinine urine  ratio     Status: None   Collection Time: 02/27/18  7:09 AM  Result Value Ref Range   Creatinine, Urine 132 20 - 320 mg/dL   Microalb, Ur 1.0 mg/dL    Comment: Reference Range Not established     Microalb Creat Ratio 8 <30 mcg/mg creat    Comment: . The ADA defines abnormalities in albumin excretion as follows: [name] Category         Result (mcg/mg creatinine) . Normal                    <30 Microalbuminuria         30-299  Clinical albuminuria   > OR = 300 . The ADA recommends that at least two of three specimens collected within a 3-6 month period be abnormal before considering a patient to be within a diagnostic category.   VITAMIN D 25 Hydroxy (Vit-D Deficiency, Fractures)     Status: Abnormal   Collection Time: 02/27/18  7:09 AM  Result Value Ref Range   Vit D, 25-Hydroxy 10 (L) 30 - 100 ng/mL    Comment: Vitamin D Status         25-OH Vitamin D: . Deficiency:                    <20 ng/mL Insufficiency:             20 - 29 ng/mL Optimal:                 > or = 30 ng/mL . For 25-OH Vitamin D testing on patients on  D2-supplementation and patients for whom quantitation  of D2 and D3 fractions is required, the [facility]ure[facility](TM) 25-OH VIT D, (D2,D3), LC/MS/MS is recommended: order  code 16109 (patients >58yrs). . For more information on this test, go to: http://education.[facility].com/faq/FAQ163 (This link is being provided for  informational/educational purposes only.)   TSH     Status: None   Collection Time: 02/27/18  7:09 AM  Result Value Ref Range   TSH 3.24 0.40 - 4.50 mIU/L  T4, free     Status: None   Collection Time: 02/27/18  7:09 AM  Result Value Ref Range   Free T4 1.0 0.8 - 1.8 ng/dL      Assessment & Plan:   1. Uncontrolled type 2 diabetes mellitus with CKD - Patient has currently uncontrolled symptomatic type 2 DM since  67 years of age. -  He returns with significantly above target glycemic profile at fasting and A1c of 8.6% increasing from 7.7%.    - His diabetes is complicated by worsening CKD, obesity/sedentary life and patient remains at a high risk for more acute and chronic complications of diabetes which include CAD, CVA, CKD,  retinopathy, and neuropathy. These are all discussed in detail with the patient.  - I have counseled the patient on diet management and weight loss, by adopting a carbohydrate restricted/protein rich diet. -He admits to dietary discretions including consumption of butter and sweets. -  Suggestion is made for him to avoid simple carbohydrates  from his diet including Cakes, Sweet Desserts / Pastries, Ice Cream, Soda (diet and regular), Sweet Tea, Candies, Chips, Cookies, Store Bought Juices, Alcohol in Excess of  1-2 drinks a day, Artificial Sweeteners, and "Sugar-free" Products. This will help patient to have stable blood glucose profile and potentially avoid unintended weight gain. - I encouraged the patient to switch to  unprocessed or minimally processed complex starch and  increased protein intake (animal or plant source), fruits, and vegetables.  - Patient is advised to stick to a routine mealtimes to eat 3 meals  a day and avoid unnecessary snacks ( to snack only to correct hypoglycemia).   - I have approached patient with the following individualized plan to manage diabetes and patient agrees:   -Based on his presentation with above target glycemic profile, he will require higher dose of insulin in order for him to achieve control of diabetes to target.   -He does have a room on titration of his basal insulin before considering prandial insulin.   -I discussed and increased his Evaristo Bury to 50 units nightly associated with strict monitoring of blood glucose 2 times a day-before breakfast and at bedtime.   -His renal function is back to normal, advised to continue low-dose metformin at 500 mg p.o. twice daily.    - Patient specific target  A1c;  LDL, HDL, Triglycerides, and  Waist Circumference were discussed in detail.  2) BP/HTN: His blood pressure is not  controlled to target.   I advised him to continue lisinopril 10 mg by mouth every morning. He has not been consistent with this  medication.  3) Lipids/HPL: His previsit fasting lipid panel showed hypertriglyceridemia 582 increasing from 189.  He was taken off of statins due to transaminitis.  He is liver enzymes are back to normal now.  He will benefit from a higher dose of omega-3 fatty acids.  I discussed and increased his omega-3 fatty acids to 2 g p.o. twice daily along with his fenofibrate.   4)  Weight/Diet: CDE Consult in progress  , exercise, and detailed carbohydrates information provided.  5) vitamin D deficiency -I discussed and initiated vitamin D D3 5000 units daily for the next 90 days.  6) Chronic Care/Health Maintenance:  -Patient is on ACEI and Statin medications and encouraged to continue to follow up with Ophthalmology, Podiatrist at least yearly or according to recommendations, and advised to   stay away from smoking. I have recommended yearly flu vaccine and pneumonia vaccination at least every 5 years; moderate intensity exercise for up to 150 minutes weekly; and  sleep for at least 7 hours a day.  - Time spent with the patient: 25 min, of which >50% was spent in reviewing his blood glucose logs , discussing his hypo- and hyper-glycemic episodes, reviewing his current and  previous labs and insulin doses and developing a plan to avoid hypo- and hyper-glycemia. Please refer to Patient Instructions for Blood Glucose Monitoring and Insulin/Medications Dosing Guide"  in media tab for additional information. Javier Walker participated in the discussions, expressed understanding, and voiced agreement with the above plans.  All questions were answered to his satisfaction. he is encouraged to contact clinic should he have any questions or concerns prior to his return visit.   Follow up plan: - Return in about 4 months (around 07/12/2018) for Follow up with Pre-visit Labs, Meter, and Logs.  Marquis Lunch, MD Phone: [phone]  Fax: [phone]  This note was partially dictated with voice recognition  software. Similar sounding words can be transcribed inadequately or may not  be corrected upon review.  03/12/2018, 4:23 PM

## 2018-04-24 ENCOUNTER — Other Ambulatory Visit: Payer: Self-pay | Admitting: "Endocrinology

## 2018-05-07 ENCOUNTER — Other Ambulatory Visit: Payer: Self-pay | Admitting: "Endocrinology

## 2018-05-14 ENCOUNTER — Other Ambulatory Visit: Payer: Self-pay | Admitting: "Endocrinology

## 2018-06-03 ENCOUNTER — Inpatient Hospital Stay (HOSPITAL_COMMUNITY)
Admission: EM | Admit: 2018-06-03 | Discharge: 2018-06-04 | DRG: 065 | Disposition: A | Discharge status: Home / Self Care | Payer: BLUE CROSS/BLUE SHIELD | Attending: Family Medicine | Admitting: Family Medicine

## 2018-06-03 ENCOUNTER — Other Ambulatory Visit: Payer: Self-pay

## 2018-06-03 ENCOUNTER — Encounter (HOSPITAL_COMMUNITY): Payer: Self-pay | Admitting: Emergency Medicine

## 2018-06-03 ENCOUNTER — Emergency Department (HOSPITAL_COMMUNITY): Payer: BLUE CROSS/BLUE SHIELD

## 2018-06-03 DIAGNOSIS — Z87891 Personal history of nicotine dependence: Secondary | ICD-10-CM | POA: Diagnosis not present

## 2018-06-03 DIAGNOSIS — Z794 Long term (current) use of insulin: Secondary | ICD-10-CM | POA: Diagnosis not present

## 2018-06-03 DIAGNOSIS — N183 Chronic kidney disease, stage 3 unspecified: Secondary | ICD-10-CM

## 2018-06-03 DIAGNOSIS — Z8249 Family history of ischemic heart disease and other diseases of the circulatory system: Secondary | ICD-10-CM | POA: Diagnosis not present

## 2018-06-03 DIAGNOSIS — I129 Hypertensive chronic kidney disease with stage 1 through stage 4 chronic kidney disease, or unspecified chronic kidney disease: Secondary | ICD-10-CM | POA: Diagnosis present

## 2018-06-03 DIAGNOSIS — G8194 Hemiplegia, unspecified affecting left nondominant side: Secondary | ICD-10-CM | POA: Diagnosis present

## 2018-06-03 DIAGNOSIS — R29701 NIHSS score 1: Secondary | ICD-10-CM | POA: Diagnosis present

## 2018-06-03 DIAGNOSIS — I10 Essential (primary) hypertension: Secondary | ICD-10-CM

## 2018-06-03 DIAGNOSIS — R1310 Dysphagia, unspecified: Secondary | ICD-10-CM | POA: Diagnosis present

## 2018-06-03 DIAGNOSIS — E1165 Type 2 diabetes mellitus with hyperglycemia: Secondary | ICD-10-CM | POA: Diagnosis present

## 2018-06-03 DIAGNOSIS — Z79899 Other long term (current) drug therapy: Secondary | ICD-10-CM | POA: Diagnosis not present

## 2018-06-03 DIAGNOSIS — Z9049 Acquired absence of other specified parts of digestive tract: Secondary | ICD-10-CM

## 2018-06-03 DIAGNOSIS — E1122 Type 2 diabetes mellitus with diabetic chronic kidney disease: Secondary | ICD-10-CM | POA: Diagnosis present

## 2018-06-03 DIAGNOSIS — I639 Cerebral infarction, unspecified: Secondary | ICD-10-CM | POA: Diagnosis not present

## 2018-06-03 DIAGNOSIS — R55 Syncope and collapse: Secondary | ICD-10-CM | POA: Diagnosis not present

## 2018-06-03 DIAGNOSIS — I63233 Cerebral infarction due to unspecified occlusion or stenosis of bilateral carotid arteries: Secondary | ICD-10-CM | POA: Diagnosis not present

## 2018-06-03 DIAGNOSIS — E782 Mixed hyperlipidemia: Secondary | ICD-10-CM | POA: Diagnosis present

## 2018-06-03 DIAGNOSIS — Z880 Allergy status to penicillin: Secondary | ICD-10-CM

## 2018-06-03 LAB — PROTIME-INR
INR: 1.09
Prothrombin Time: 14 seconds (ref 11.4–15.2)

## 2018-06-03 LAB — CBC
HCT: 43.2 % (ref 39.0–52.0)
Hemoglobin: 13.8 g/dL (ref 13.0–17.0)
MCH: 28.2 pg (ref 26.0–34.0)
MCHC: 31.9 g/dL (ref 30.0–36.0)
MCV: 88.2 fL (ref 80.0–100.0)
Platelets: 232 10*3/uL (ref 150–400)
RBC: 4.9 MIL/uL (ref 4.22–5.81)
RDW: 13.4 % (ref 11.5–15.5)
WBC: 9.2 10*3/uL (ref 4.0–10.5)
nRBC: 0 % (ref 0.0–0.2)

## 2018-06-03 LAB — COMPREHENSIVE METABOLIC PANEL
ALT: 37 U/L (ref 0–44)
AST: 34 U/L (ref 15–41)
Albumin: 3.8 g/dL (ref 3.5–5.0)
Alkaline Phosphatase: 34 U/L — ABNORMAL LOW (ref 38–126)
Anion gap: 8 (ref 5–15)
BUN: 20 mg/dL (ref 8–23)
CO2: 26 mmol/L (ref 22–32)
CREATININE: 1.25 mg/dL — AB (ref 0.61–1.24)
Calcium: 9.4 mg/dL (ref 8.9–10.3)
Chloride: 102 mmol/L (ref 98–111)
GFR calc Af Amer: >60 mL/min (ref >60)
GFR calc non Af Amer: 59 mL/min — ABNORMAL LOW (ref >60)
Glucose, Bld: 176 mg/dL — ABNORMAL HIGH (ref 70–99)
Potassium: 4.4 mmol/L (ref 3.5–5.1)
Sodium: 136 mmol/L (ref 135–145)
Total Bilirubin: 0.9 mg/dL (ref 0.3–1.2)
Total Protein: 7.6 g/dL (ref 6.5–8.1)

## 2018-06-03 LAB — DIFFERENTIAL
Abs Immature Granulocytes: 0.02 10*3/uL (ref 0.00–0.07)
Basophils Absolute: 0 10*3/uL (ref 0.0–0.1)
Basophils Relative: 0 %
Eosinophils Absolute: 0.4 10*3/uL (ref 0.0–0.5)
Eosinophils Relative: 4 %
Immature Granulocytes: 0 %
Lymphocytes Relative: 41 %
Lymphs Abs: 3.8 10*3/uL (ref 0.7–4.0)
Monocytes Absolute: 0.7 10*3/uL (ref 0.1–1.0)
Monocytes Relative: 8 %
Neutro Abs: 4.2 10*3/uL (ref 1.7–7.7)
Neutrophils Relative %: 47 %

## 2018-06-03 LAB — GLUCOSE, CAPILLARY
Glucose-Capillary: 102 mg/dL — ABNORMAL HIGH (ref 70–99)
Glucose-Capillary: 161 mg/dL — ABNORMAL HIGH (ref 70–99)

## 2018-06-03 LAB — LIPID PANEL
Cholesterol: 197 mg/dL (ref 0–200)
HDL: 24 mg/dL — ABNORMAL LOW (ref >40)
LDL Cholesterol: 119 mg/dL — ABNORMAL HIGH (ref 0–99)
Total CHOL/HDL Ratio: 8.2 RATIO
Triglycerides: 271 mg/dL — ABNORMAL HIGH (ref <150)
VLDL: 54 mg/dL — ABNORMAL HIGH (ref 0–40)

## 2018-06-03 LAB — HEMOGLOBIN A1C
HEMOGLOBIN A1C: 8.4 % — AB (ref 4.8–5.6)
Mean Plasma Glucose: 194.38 mg/dL

## 2018-06-03 LAB — TSH: TSH: 1.819 u[IU]/mL (ref 0.350–4.500)

## 2018-06-03 LAB — CBG MONITORING, ED
Glucose-Capillary: 164 mg/dL — ABNORMAL HIGH (ref 70–99)
Glucose-Capillary: 144 mg/dL — ABNORMAL HIGH (ref 70–99)

## 2018-06-03 LAB — APTT: APTT: 31 s (ref 24–36)

## 2018-06-03 MED ORDER — SODIUM CHLORIDE 0.9% FLUSH: 3.0000 mL | Freq: Once | INTRAVENOUS | Status: DC

## 2018-06-03 MED ORDER — ALBUTEROL SULFATE (2.5 MG/3ML) 0.083% IN NEBU: 2.5000 mg | INHALATION_SOLUTION | RESPIRATORY_TRACT | Status: DC | PRN

## 2018-06-03 MED ORDER — ASPIRIN 325 MG PO TABS: 325.0000 mg | ORAL_TABLET | Freq: Every day | ORAL | Status: DC

## 2018-06-03 MED ORDER — CLOPIDOGREL BISULFATE 75 MG PO TABS
75.0000 mg | ORAL_TABLET | Freq: Every day | ORAL | Status: DC
  Administered 2018-06-04: 75 mg via ORAL
  Filled 2018-06-03: qty 1

## 2018-06-03 MED ORDER — ATORVASTATIN CALCIUM 40 MG PO TABS
40.0000 mg | ORAL_TABLET | Freq: Every day | ORAL | Status: DC
  Administered 2018-06-04: 40 mg via ORAL
  Filled 2018-06-03: qty 1

## 2018-06-03 MED ORDER — SODIUM CHLORIDE 0.9 % IV SOLN: 250.0000 mL | INTRAVENOUS | Status: DC | PRN

## 2018-06-03 MED ORDER — INSULIN ASPART 100 UNIT/ML ~~LOC~~ SOLN
0.0000 [IU] | Freq: Three times a day (TID) | SUBCUTANEOUS | Status: DC
  Filled 2018-06-03: qty 1

## 2018-06-03 MED ORDER — SODIUM CHLORIDE 0.9 % IV SOLN: INTRAVENOUS | Status: DC

## 2018-06-03 MED ORDER — ONDANSETRON HCL 4 MG/2ML IJ SOLN: 4.0000 mg | Freq: Four times a day (QID) | INTRAMUSCULAR | Status: DC | PRN

## 2018-06-03 MED ORDER — ONDANSETRON HCL 4 MG PO TABS: 4.0000 mg | ORAL_TABLET | Freq: Four times a day (QID) | ORAL | Status: DC | PRN

## 2018-06-03 MED ORDER — ACETAMINOPHEN 650 MG RE SUPP: 650.0000 mg | Freq: Four times a day (QID) | RECTAL | Status: DC | PRN

## 2018-06-03 MED ORDER — HEPARIN SODIUM (PORCINE) 5000 UNIT/ML IJ SOLN
5000.0000 [IU] | Freq: Three times a day (TID) | INTRAMUSCULAR | Status: DC
  Administered 2018-06-03 – 2018-06-04 (×4): 5000 [IU] via SUBCUTANEOUS
  Filled 2018-06-03 (×4): qty 1

## 2018-06-03 MED ORDER — ACETAMINOPHEN 325 MG PO TABS: 650.0000 mg | ORAL_TABLET | Freq: Four times a day (QID) | ORAL | Status: DC | PRN

## 2018-06-03 MED ORDER — FENOFIBRATE 160 MG PO TABS
160.0000 mg | ORAL_TABLET | Freq: Every day | ORAL | Status: DC
  Administered 2018-06-04: 160 mg via ORAL
  Filled 2018-06-03: qty 1

## 2018-06-03 MED ORDER — VITAMIN D 25 MCG (1000 UNIT) PO TABS
5000.0000 [IU] | ORAL_TABLET | Freq: Every day | ORAL | Status: DC
  Administered 2018-06-04: 5000 [IU] via ORAL
  Filled 2018-06-03: qty 5

## 2018-06-03 MED ORDER — INSULIN ASPART 100 UNIT/ML ~~LOC~~ SOLN: 0.0000 [IU] | Freq: Every day | SUBCUTANEOUS | Status: DC

## 2018-06-03 MED ORDER — SODIUM CHLORIDE 0.9% FLUSH: 3.0000 mL | INTRAVENOUS | Status: DC | PRN

## 2018-06-03 MED ORDER — SODIUM CHLORIDE 0.9% FLUSH: 3.0000 mL | Freq: Two times a day (BID) | INTRAVENOUS | Status: DC

## 2018-06-03 MED ORDER — POLYETHYLENE GLYCOL 3350 17 G PO PACK: 17.0000 g | PACK | Freq: Every day | ORAL | Status: DC | PRN

## 2018-06-03 MED ORDER — ASPIRIN 325 MG PO TABS
325.0000 mg | ORAL_TABLET | Freq: Once | ORAL | Status: AC
  Administered 2018-06-03: 325 mg via ORAL
  Filled 2018-06-03: qty 1

## 2018-06-03 MED ORDER — TRAZODONE HCL 50 MG PO TABS: 50.0000 mg | ORAL_TABLET | Freq: Every evening | ORAL | Status: DC | PRN

## 2018-06-03 MED ORDER — OMEGA-3-ACID ETHYL ESTERS 1 G PO CAPS
2000.0000 mg | ORAL_CAPSULE | Freq: Every day | ORAL | Status: DC
  Administered 2018-06-04: 2000 mg via ORAL
  Filled 2018-06-03: qty 2

## 2018-06-03 MED ORDER — ASPIRIN 81 MG PO CHEW: 81.0000 mg | CHEWABLE_TABLET | Freq: Every day | ORAL | Status: DC

## 2018-06-03 NOTE — ED Provider Notes (Addendum)
[facility] EMERGENCY DEPARTMENT Provider Note   CSN: [id number] Arrival date & time: 06/03/18  0909    History   Chief Complaint Chief Complaint  Patient presents with  . Numbness  . Weakness    HPI Javier Walker is a 68 y.o. male with a history of diabetes, hypertension, hyperlipidemia and GERD presenting with left sided weakness including his arm and leg which started 2 mornings ago.  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  He got off the road, slept for 4 hours and woke with continued weakness and difficulty ambulating.  He returned home today and presents here.  He no longer has weakness in his leg, but reports continued weakness and difficulty controlling his left hand, describing he kept hitting his head when trying to brush his hair this morning and describes a continued tingling sensation in his left hand.  Patient is right-handed.  He denies any pain including headache, chest pain, also denies palpitations, dizziness, visual disturbance or difficulty speaking.  He has had no treatments prior to arrival.  Denies previous similar symptoms.     The history is provided by the patient.    Past Medical History:  Diagnosis Date  . Diabetes mellitus without complication (HCC)   . GERD (gastroesophageal reflux disease)    occ-no meds  . Hives    patient has hives with anxiety  . Hyperlipemia   . Hypertension   . Shortness of breath     Patient Active Problem List   Diagnosis Date Noted  . CVA (cerebral vascular accident)/Rt Thalamic Stroke 06/03/2018  . Vitamin D deficiency 03/12/2018  . Mixed hyperlipidemia 12/07/2016  . S/P cholecystectomy 08/26/2016  . Calculus of gallbladder without cholecystitis without obstruction   . Cholangitis 08/19/2016  . Calculus of gallbladder and bile duct with obstruction without cholecystitis   . Sepsis (HCC) 08/16/2016  . AKI (acute kidney injury) (HCC) 08/16/2016  .  Dehydration 08/16/2016  . Elevated LFTs 08/16/2016  . Jaundice 08/16/2016  . UTI (urinary tract infection) 08/16/2016  . Type 2 diabetes mellitus with stage 3 chronic kidney disease, with long-term current use of insulin (HCC) 03/01/2016  . Essential hypertension, benign 03/01/2016  . Class 2 severe obesity due to excess calories with serious comorbidity and body mass index (BMI) of 38.0 to 38.9 in adult [facility]) 03/01/2016    Past Surgical History:  Procedure Laterality Date  . CHOLECYSTECTOMY N/A 08/26/2016   Procedure: CHOLECYSTECTOMY;  Surgeon: Franky Macho, MD;  Location: [facility];  Service: General;  Laterality: N/A;  . COLON SURGERY  2011   gangrene lower colon with colostomy  . COLOSTOMY REVERSAL  2011  . ERCP N/A 08/17/2016   Procedure: ENDOSCOPIC RETROGRADE CHOLANGIOPANCREATOGRAPHY (ERCP) Biliary sphincterotomy and stone extraction;  Surgeon: Malissa Hippo, MD;  Location: [facility];  Service: Endoscopy;  Laterality: N/A;  . OPEN REDUCTION INTERNAL FIXATION (ORIF) TIBIA/FIBULA FRACTURE Right 08/30/2012   Procedure: OPEN REDUCTION INTERNAL FIXATION (ORIF) TIBIA/FIBULA FRACTURE;  Surgeon: Toni Arthurs, MD;  Location: [facility];  Service: Orthopedics;  Laterality: Right;  . WOUND DEBRIDEMENT  2011   abd-        Home Medications    Prior to Admission medications   Medication Sig Start Date End Date Taking? Authorizing Provider  Cholecalciferol (VITAMIN D3) 125 MCG (5000 UT) CAPS Take 1 capsule (5,000 Units total) by mouth daily. 03/12/18   Roma Kayser, MD  fenofibrate (  TRICOR) 145 MG tablet TAKE 1 TABLET BY MOUTH ONCE DAILY 04/25/18   Roma Kayser, MD  insulin degludec (TRESIBA FLEXTOUCH) 100 UNIT/ML SOPN FlexTouch Pen Inject 0.5 mLs (50 Units total) into the skin at bedtime. 03/12/18   Roma Kayser, MD  lisinopril (PRINIVIL,ZESTRIL) 10 MG tablet TAKE 1 TABLET BY MOUTH ONCE DAILY 05/08/18   Roma Kayser, MD  metFORMIN (GLUCOPHAGE)  500 MG tablet TAKE 1 TABLET BY MOUTH TWICE DAILY WITH A MEAL 11/02/17   Nida, Denman George, MD  Omega-3 Fatty Acids (FISH OIL) 1200 MG CAPS Take 2,400 mg by mouth daily.    [provider]    Family History Family History  Problem Relation Age of Onset  . Heart attack Mother   . Heart attack Father     Social History Social History   Tobacco Use  . Smoking status: Former Smoker    Packs/day: 2.00    Years: 15.00    Pack years: 30.00    Types: Cigarettes    Last attempt to quit: 08/27/1988    Years since quitting: 29.7  . Smokeless tobacco: Never Used  Substance Use Topics  . Alcohol use: Yes    Comment: rarely  . Drug use: No     Allergies   Augmentin [amoxicillin-pot clavulanate]   Review of Systems Review of Systems  Constitutional: Negative for fever.  HENT: Negative for congestion, sore throat and trouble swallowing.   Eyes: Negative.  Negative for visual disturbance.  Respiratory: Negative for chest tightness and shortness of breath.   Cardiovascular: Negative for chest pain and palpitations.  Gastrointestinal: Negative for abdominal pain and nausea.  Genitourinary: Negative.   Musculoskeletal: Negative for arthralgias, joint swelling and neck pain.  Skin: Negative.  Negative for rash and wound.  Neurological: Positive for weakness and numbness. Negative for dizziness, light-headedness and headaches.  Psychiatric/Behavioral: Negative.      Physical Exam Updated Vital Signs BP 139/66   Pulse 77   Resp 13   SpO2 95%   Physical Exam Vitals signs and nursing note reviewed.  Constitutional:      Appearance: He is well-developed.  HENT:     Head: Normocephalic and atraumatic.  Eyes:     Extraocular Movements: Extraocular movements intact.     Conjunctiva/sclera: Conjunctivae normal.     Pupils: Pupils are equal, round, and reactive to light.  Neck:     Musculoskeletal: Normal range of motion and neck supple.     Vascular: No carotid bruit.   Cardiovascular:     Rate and Rhythm: Normal rate and regular rhythm.     Heart sounds: Normal heart sounds.  Pulmonary:     Effort: Pulmonary effort is normal.     Breath sounds: Normal breath sounds. No wheezing.  Abdominal:     General: Bowel sounds are normal.     Palpations: Abdomen is soft.     Tenderness: There is no abdominal tenderness.  Musculoskeletal: Normal range of motion.     Comments: Left hand contractures 4th and 5th fingers (chronic).  Lymphadenopathy:     Cervical: No cervical adenopathy.  Skin:    General: Skin is warm and dry.     Findings: No rash.  Neurological:     Mental Status: He is alert and oriented to person, place, and time.     GCS: GCS eye subscore is 4. GCS verbal subscore is 5. GCS motor subscore is 6.     Sensory: No sensory deficit.  Gait: Gait normal.     Comments: Normal heel-shin, equal grip strength.  Decreased sensation to fine touch left dorsal hand. Cranial nerves III-XII intact, tongue midline, EOM's intact.   No pronator drift.   Psychiatric:        Speech: Speech normal.        Behavior: Behavior normal.        Thought Content: Thought content normal.      ED Treatments / Results  Labs (all labs ordered are listed, but only abnormal results are displayed) Labs Reviewed  COMPREHENSIVE METABOLIC PANEL - Abnormal; Notable for the following components:      Result Value   Glucose, Bld 176 (*)    Creatinine, Ser 1.25 (*)    Alkaline Phosphatase 34 (*)    GFR calc non Af Amer 59 (*)    All other components within normal limits  CBG MONITORING, ED - Abnormal; Notable for the following components:   Glucose-Capillary 164 (*)    All other components within normal limits  PROTIME-INR  APTT  CBC  DIFFERENTIAL    EKG EKG Interpretation  Date/Time:  Sunday June 03 2018 09:20:20 EST Ventricular Rate:  76 PR Interval:    QRS Duration: 92 QT Interval:  358 QTC Calculation: 403 R Axis:   113 Text Interpretation:   Sinus rhythm Consider RVH w/ secondary repol abnormality Baseline wander Artifact When compared with ECG of 08/16/2016 No significant change was found Confirmed by Samuel Jester [phone]) on 06/03/2018 9:58:33 AM   Radiology Ct Head Wo Contrast  Result Date: 06/03/2018 CLINICAL DATA:  Possible stroke.  Left-sided weakness EXAM: CT HEAD WITHOUT CONTRAST TECHNIQUE: Contiguous axial images were obtained from the base of the skull through the vertex without intravenous contrast. COMPARISON:  None. FINDINGS: Brain: Hypodensity right thalamus compatible infarct of indeterminate age. Hypodensity left lateral basal ganglia compatible with infarct which is likely chronic. Negative for hemorrhage or mass.  Ventricle size normal. Vascular: Negative for hyperdense vessel Skull: Negative Sinuses/Orbits: Mild mucosal edema paranasal sinuses. Other: None IMPRESSION: Infarct right thalamus could be subacute or chronic. Infarct left lateral basal ganglia probably chronic. Negative for hemorrhage. Electronically Signed   By: Marlan Palau M.D.   On: 06/03/2018 10:45    Procedures Procedures (including critical care time)  Medications Ordered in ED Medications  sodium chloride flush (NS) 0.9 % injection 3 mL (has no administration in time range)  fenofibrate tablet 160 mg (has no administration in time range)  Fish Oil CAPS 2,400 mg (has no administration in time range)  Vitamin D3 CAPS 5,000 Units (has no administration in time range)  aspirin tablet 325 mg (has no administration in time range)  aspirin chewable tablet 81 mg (has no administration in time range)  clopidogrel (PLAVIX) tablet 75 mg (has no administration in time range)  atorvastatin (LIPITOR) tablet 40 mg (has no administration in time range)     Initial Impression / Assessment and Plan / ED Course  I have reviewed the triage vital signs and the nursing notes.  Pertinent labs & imaging results that were available during my care of the  patient were reviewed by me and considered in my medical decision making (see chart for details).        Patient with possible TIA versus evolving stroke.  CT imaging and labs were reviewed and discussed with patient and his wife.  They are agreeable to admission for further evaluation of his symptoms.  Discussed with Dr. Mariea Clonts accepts pt for admission.  Final Clinical Impressions(s) / ED Diagnoses   Final diagnoses:  Cerebrovascular accident (CVA), unspecified mechanism [facility])    ED Discharge Orders    None       Victoriano Lain 06/03/18 1129    Samuel Jester, DO 06/06/18 1504   CRITICAL CARE Performed by: Burgess Amor Total critical care time: 40 minutes Critical care time was exclusive of separately billable procedures and treating other patients. Critical care was necessary to treat or prevent imminent or life-threatening deterioration. Critical care was time spent personally by me on the following activities: development of treatment plan with patient and/or surrogate as well as nursing, discussions with consultants, evaluation of patient's response to treatment, examination of patient, obtaining history from patient or surrogate, ordering and performing treatments and interventions, ordering and review of laboratory studies, ordering and review of radiographic studies, pulse oximetry and re-evaluation of patient's condition.    Burgess Amor, PA-C 06/14/18 1722    Samuel Jester, DO 06/16/18 [id number]

## 2018-06-03 NOTE — H&P (Signed)
Patient Demographics:    Javier Walker, is a 68 y.o. male  MRN: [id number]   DOB - 03/26/1951  Admit Date - 06/03/2018  Outpatient Primary MD for the patient is Assunta Found, MD   Assessment & Plan:    Principal Problem:   CVA (cerebral vascular accident)/Rt Thalamic Stroke Active Problems:   Type 2 diabetes mellitus with stage 3 chronic kidney disease, with long-term current use of insulin (HCC)   Essential hypertension, benign   Mixed hyperlipidemia    1) subacute right thalamic infarct/CVA -patient presented more than 48 hours after onset of symptoms, place on telemetry monitored unit, treat empirically with aspirin and plavix (patient was on aspirin 81 mg PTA, suspect aspirin monotherapy failure), give Lipitor, CT head suggestive of subacute right thalamic infarct and old left basal ganglia strokes, MRI of the brain pending, echocardiogram to rule out intracardiac thrombus  and to evaluate EF is pending, consider carotid artery Dopplers or CTA  or MRA Head/Neck to rule out or large Vessel occlusion or hemodynamically significant stenosis . Neurology consult pending.  PT/OT eval pending, speech eval pending, We will allow some permissive hypertension in view of possible acute stroke, avoid precipitous drop in blood pressure. Use Hydralazine 10mg    iv every 4 hrs as needed for systolic blood pressure over 210 mmhg , TSH is 1.8, A1c is pending and fasting lipid profile shows LDL of 119, HDL of 24 and triglycerides of 271   2)DM2--- patient A1c was 8.6, hold Tresiba due to swallowing difficulties oral intake will be unreliable, Use Novolog/Humalog Sliding scale insulin with Accu-Cheks/Fingersticks as ordered, with repeat A1c    3)HTN-- Allow some permissive Hypertension due to acute stroke, avoid very aggressive,  rapid BP control at this time.  Hold lisinopril, may use IV hydralazine with parameters  4)CKD III--- renal function appears to be stable at this time with a creatinine of 1.25, suspect CKD secondary to underlying hypertension and diabetes, avoid nephrotoxic agents  With History of - Reviewed by me  Past Medical History:  Diagnosis Date  . Diabetes mellitus without complication (HCC)   . GERD (gastroesophageal reflux disease)    occ-no meds  . Hives    patient has hives with anxiety  . Hyperlipemia   . Hypertension   . Shortness of breath       Past Surgical History:  Procedure Laterality Date  . CHOLECYSTECTOMY N/A 08/26/2016   Procedure: CHOLECYSTECTOMY;  Surgeon: Franky Macho, MD;  Location: [facility];  Service: General;  Laterality: N/A;  . COLON SURGERY  2011   gangrene lower colon with colostomy  . COLOSTOMY REVERSAL  2011  . ERCP N/A 08/17/2016   Procedure: ENDOSCOPIC RETROGRADE CHOLANGIOPANCREATOGRAPHY (ERCP) Biliary sphincterotomy and stone extraction;  Surgeon: Malissa Hippo, MD;  Location[facility];  Service: Endoscopy;  Laterality: N/A;  . OPEN REDUCTION INTERNAL FIXATION (ORIF) TIBIA/FIBULA FRACTURE Right 08/30/2012   Procedure: OPEN REDUCTION INTERNAL FIXATION (ORIF) TIBIA/FIBULA  FRACTURE;  Surgeon: Toni Arthurs, MD;  Location: [facility];  Service: Orthopedics;  Laterality: Right;  . WOUND DEBRIDEMENT  2011   abd-      Chief Complaint  Patient presents with  . Numbness  . Weakness      HPI:    Javier Walker  is a 68 y.o. male past medical history relevant for poorly controlled diabetes, obesity,  hypertension and dyslipidemia presents to the ED with more than 48-hour history of left-sided numbness and weakness, gait disturbance and facial asymmetry.... 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 on 06/01/2018 he noticed left-sided weakness, facial numbness and gait disturbance----  Around 4 AM on  06/02/2018 his symptoms somewhat got worse-----especially gait  When he arrived back in [place] his wife noticed that his gait was off  Additional history obtained from patient's wife at bedside--- No chest pains, no palpitations, no dizziness, no leg pains, no leg swelling no pleuritic symptoms  No headaches, no visual disturbance, he denies significant speech or swallowing disturbance  No fevers, no chills, no cough or other URI symptoms  In ED--- CT head was suggestive of subacute right thalamic infarct and old left basal ganglia infarcts  BP was somewhat elevated     Review of systems:    In addition to the HPI above,   A full Review of  Systems was done, all other systems reviewed are negative except as noted above in HPI , .    Social History:  Reviewed by me    Social History   Tobacco Use  . Smoking status: Former Smoker    Packs/day: 2.00    Years: 15.00    Pack years: 30.00    Types: Cigarettes    Last attempt to quit: 08/27/1988    Years since quitting: 29.7  . Smokeless tobacco: Never Used  Substance Use Topics  . Alcohol use: Yes    Comment: rarely       Family History :  Reviewed by me  Family History  Problem Relation Age of Onset  . Heart attack Mother   . Heart attack Father     Home Medications:   Prior to Admission medications   Medication Sig Start Date End Date Taking? Authorizing Provider  Cholecalciferol (VITAMIN D3) 125 MCG (5000 UT) CAPS Take 1 capsule (5,000 Units total) by mouth daily. 03/12/18  Yes Nida, Denman George, MD  fenofibrate (TRICOR) 145 MG tablet TAKE 1 TABLET BY MOUTH ONCE DAILY 04/25/18  Yes Nida, Denman George, MD  insulin degludec (TRESIBA FLEXTOUCH) 100 UNIT/ML SOPN FlexTouch Pen Inject 0.5 mLs (50 Units total) into the skin at bedtime. 03/12/18  Yes Nida, Denman George, MD  lisinopril (PRINIVIL,ZESTRIL) 10 MG tablet TAKE 1 TABLET BY MOUTH ONCE DAILY 05/08/18  Yes Nida, Denman George, MD  metFORMIN  (GLUCOPHAGE) 500 MG tablet TAKE 1 TABLET BY MOUTH TWICE DAILY WITH A MEAL 11/02/17  Yes Nida, Denman George, MD  Omega-3 Fatty Acids (FISH OIL) 1200 MG CAPS Take 2,400 mg by mouth daily.   Yes [provider]     Allergies:     Allergies  Allergen Reactions  . Augmentin [Amoxicillin-Pot Clavulanate] Rash     Physical Exam:   Vitals  Blood pressure (!) 142/74, pulse 72, resp. rate 20, SpO2 93 %.  Physical Examination: General appearance - alert, well appearing, and in no distress  Mental status - alert, oriented to person, place,  and time,  Eyes - sclera anicteric Neck - supple, no JVD elevation , Chest - clear  to auscultation bilaterally, symmetrical air movement,  Heart - S1 and S2 normal, regular  Abdomen - soft, nontender, nondistended, no masses or organomegaly Extremities - no pedal edema noted, intact peripheral pulses  Skin - warm, dry Neuro--- Neurologial Exam:screening mental status exam normal, neck supple without rigidity--- Mental Status: Patient is awake, alert, oriented to person, place, month, year, and situation. Patient is able to give a clear and coherent history. No signs of aphasia or neglect Cranial Nerves: II: Visual Fields are full. Pupils are equal, round, and reactive to light.   III,IV, VI: EOMI without ptosis or diploplia.  V: Facial sensation is  diminished on the left VII: Facial movement with subtle weakness and subtle droop on the left VIII: hearing is intact to voice X: Uvula elevates symmetrically XI: Shoulder shrug is symmetric. XII: tongue is midline without atrophy or fasciculations.  Motor: Tone is normal. Bulk is normal.  Left-sided hemiparesis with muscle strength left lower extremity 4/5 left upper extremity 4/5, muscle strength is 5/5 on the right Sensory: Sensation is normal to pinprick and to touch Cerebellar: FNF without ataxia     Data Review:    CBC Recent Labs  Lab 06/03/18 0930  WBC 9.2  HGB 13.8  HCT  43.2  PLT 232  MCV 88.2  MCH 28.2  MCHC 31.9  RDW 13.4  LYMPHSABS 3.8  MONOABS 0.7  EOSABS 0.4  BASOSABS 0.0   ------------------------------------------------------------------------------------------------------------------  Chemistries  Recent Labs  Lab 06/03/18 0930  NA 136  K 4.4  CL 102  CO2 26  GLUCOSE 176*  BUN 20  CREATININE 1.25*  CALCIUM 9.4  AST 34  ALT 37  ALKPHOS 34*  BILITOT 0.9   ------------------------------------------------------------------------------------------------------------------ CrCl cannot be calculated (Unknown ideal weight.). ------------------------------------------------------------------------------------------------------------------ Recent Labs    06/03/18 0930  TSH 1.819     Coagulation profile Recent Labs  Lab 06/03/18 0930  INR 1.09   ---------------------------------------------------------------------------------------------------------------  Urinalysis    Component Value Date/Time   COLORURINE YELLOW 08/16/2016 0813   APPEARANCEUR CLOUDY (A) 08/16/2016 0813   LABSPEC 1.020 08/16/2016 0813   PHURINE 5.0 08/16/2016 0813   GLUCOSEU 500 (A) 08/16/2016 0813   HGBUR SMALL (A) 08/16/2016 0813   BILIRUBINUR MODERATE (A) 08/16/2016 0813   KETONESUR TRACE (A) 08/16/2016 0813   PROTEINUR 100 (A) 08/16/2016 0813   UROBILINOGEN 0.2 05/19/2009 1333   NITRITE NEGATIVE 08/16/2016 0813   LEUKOCYTESUR MODERATE (A) 08/16/2016 0813    ----------------------------------------------------------------------------------------------------------------   Imaging Results:    Ct Head Wo Contrast  Result Date: 06/03/2018 CLINICAL DATA:  Possible stroke.  Left-sided weakness EXAM: CT HEAD WITHOUT CONTRAST TECHNIQUE: Contiguous axial images were obtained from the base of the skull through the vertex without intravenous contrast. COMPARISON:  None. FINDINGS: Brain: Hypodensity right thalamus compatible infarct of indeterminate age.  Hypodensity left lateral basal ganglia compatible with infarct which is likely chronic. Negative for hemorrhage or mass.  Ventricle size normal. Vascular: Negative for hyperdense vessel Skull: Negative Sinuses/Orbits: Mild mucosal edema paranasal sinuses. Other: None IMPRESSION: Infarct right thalamus could be subacute or chronic. Infarct left lateral basal ganglia probably chronic. Negative for hemorrhage. Electronically Signed   By: Marlan Palau M.D.   On: 06/03/2018 10:45    Radiological Exams on Admission: Ct Head Wo Contrast  Result Date: 06/03/2018 CLINICAL DATA:  Possible stroke.  Left-sided weakness EXAM: CT HEAD WITHOUT CONTRAST TECHNIQUE: Contiguous axial images  were obtained from the base of the skull through the vertex without intravenous contrast. COMPARISON:  None. FINDINGS: Brain: Hypodensity right thalamus compatible infarct of indeterminate age. Hypodensity left lateral basal ganglia compatible with infarct which is likely chronic. Negative for hemorrhage or mass.  Ventricle size normal. Vascular: Negative for hyperdense vessel Skull: Negative Sinuses/Orbits: Mild mucosal edema paranasal sinuses. Other: None IMPRESSION: Infarct right thalamus could be subacute or chronic. Infarct left lateral basal ganglia probably chronic. Negative for hemorrhage. Electronically Signed   By: Marlan Palau M.D.   On: 06/03/2018 10:45    DVT Prophylaxis -SCD /Heparin AM Labs Ordered, also please review Full Orders  Family Communication: Admission, patients condition and plan of care including tests being ordered have been discussed with the patient and wife who indicate understanding and agree with the plan   Code Status - Full Code  Likely DC to home  Condition   stable  Shon Hale M.D on 06/03/2018 at 3:55 PM Go to [email or web address] -  for contact info  [facility] - Office  [phone]

## 2018-06-03 NOTE — ED Notes (Signed)
Pt still reports slight tingling in left hand. Ambulating to BR independently

## 2018-06-03 NOTE — ED Triage Notes (Signed)
Patient complains of left sided weakness that started Friday night at 9 pm. States trouble walking due to left leg weakness. Patient states numbness in left hand started at same time as weakness. Patient is alert x 4.

## 2018-06-03 NOTE — ED Notes (Signed)
ED TO INPATIENT HANDOFF REPORT  Name/Age/Gender Javier Walker 68 y.o. male  Code Status    Code Status Orders  (From admission, onward)         Start     Ordered   06/03/18 1139  Full code  Continuous     06/03/18 1138        Code Status History    Date Active Date Inactive Code Status Order ID Comments User Context   08/26/2016 1320 08/27/2016 1500 Full Code [id number]  Franky Macho, MD Inpatient   08/16/2016 1714 08/19/2016 1610 Full Code [id number]  Erick Blinks, MD Inpatient   08/30/2012 1758 08/31/2012 1034 Full Code [id number]  Toni Arthurs, MD Inpatient      Home/SNF/Other Home  Chief Complaint Numbness  Level of Care/Admitting Diagnosis ED Disposition    ED Disposition Condition Comment   Admit  Hospital Area: [facility] [[id number]]  Level of Care: Telemetry [5]  Diagnosis: CVA (cerebral vascular accident) [facility]) [[id number]]  Admitting Physician: Marylyn Ishihara  Attending Physician: Marylyn Ishihara  Estimated length of stay: 3 - 4 days  Certification:: I certify this patient will need inpatient services for at least 2 midnights  Bed request comments: tele  PT Class (Do Not Modify): Inpatient [101]  PT Acc Code (Do Not Modify): Private [1]       Medical History Past Medical History:  Diagnosis Date  . Diabetes mellitus without complication (HCC)   . GERD (gastroesophageal reflux disease)    occ-no meds  . Hives    patient has hives with anxiety  . Hyperlipemia   . Hypertension   . Shortness of breath     Allergies Allergies  Allergen Reactions  . Augmentin [Amoxicillin-Pot Clavulanate] Rash    IV Location/Drains/Wounds Patient Lines/Drains/Airways Status   Active Line/Drains/Airways    Name:   Placement date:   Placement time:   Site:   Days:   Peripheral IV 06/03/18 Right Antecubital   06/03/18    1016    Antecubital   less than 1          Labs/Imaging Results for orders placed or performed during the hospital  encounter of 06/03/18 (from the past 48 hour(s))  CBG monitoring, ED     Status: Abnormal   Collection Time: 06/03/18  9:25 AM  Result Value Ref Range   Glucose-Capillary 164 (H) 70 - 99 mg/dL  Protime-INR     Status: None   Collection Time: 06/03/18  9:30 AM  Result Value Ref Range   Prothrombin Time 14.0 11.4 - 15.2 seconds   INR 1.09     Comment: Performed at [facility], [address] [place], [place] [address]  APTT     Status: None   Collection Time: 06/03/18  9:30 AM  Result Value Ref Range   aPTT 31 24 - 36 seconds    Comment: Performed at [facility], [address], [place], [place] [address]  CBC     Status: None   Collection Time: 06/03/18  9:30 AM  Result Value Ref Range   WBC 9.2 4.0 - 10.5 K/uL   RBC 4.90 4.22 - 5.81 MIL/uL   Hemoglobin 13.8 13.0 - 17.0 g/dL   HCT 38.7 56.4 - 33.2 %   MCV 88.2 80.0 - 100.0 fL   MCH 28.2 26.0 - 34.0 pg   MCHC 31.9 30.0 - 36.0 g/dL   RDW 95.1 88.4 - 16.6 %   Platelets 232 150 - 400 K/uL  nRBC 0.0 0.0 - 0.2 %    Comment: Performed at [facility], [address], [place], [place] [address]  Differential     Status: None   Collection Time: 06/03/18  9:30 AM  Result Value Ref Range   Neutrophils Relative % 47 %   Neutro Abs 4.2 1.7 - 7.7 K/uL   Lymphocytes Relative 41 %   Lymphs Abs 3.8 0.7 - 4.0 K/uL   Monocytes Relative 8 %   Monocytes Absolute 0.7 0.1 - 1.0 K/uL   Eosinophils Relative 4 %   Eosinophils Absolute 0.4 0.0 - 0.5 K/uL   Basophils Relative 0 %   Basophils Absolute 0.0 0.0 - 0.1 K/uL   Immature Granulocytes 0 %   Abs Immature Granulocytes 0.02 0.00 - 0.07 K/uL    Comment: Performed at [facility], [address], [place], [place] [address]  Comprehensive metabolic panel     Status: Abnormal   Collection Time: 06/03/18  9:30 AM  Result Value Ref Range   Sodium 136 135 - 145 mmol/L   Potassium 4.4 3.5 - 5.1 mmol/L   Chloride 102 98 - 111 mmol/L   CO2 26 22 - 32 mmol/L   Glucose, Bld 176 (H) 70 - 99 mg/dL    BUN 20 8 - 23 mg/dL   Creatinine, Ser 3.61 (H) 0.61 - 1.24 mg/dL   Calcium 9.4 8.9 - 22.4 mg/dL   Total Protein 7.6 6.5 - 8.1 g/dL   Albumin 3.8 3.5 - 5.0 g/dL   AST 34 15 - 41 U/L   ALT 37 0 - 44 U/L   Alkaline Phosphatase 34 (L) 38 - 126 U/L   Total Bilirubin 0.9 0.3 - 1.2 mg/dL   GFR calc non Af Amer 59 (L) >60 mL/min   GFR calc Af Amer >60 >60 mL/min   Anion gap 8 5 - 15    Comment: Performed at [facility], [address], [place], [place] [address]  Lipid panel     Status: Abnormal   Collection Time: 06/03/18  9:30 AM  Result Value Ref Range   Cholesterol 197 0 - 200 mg/dL   Triglycerides 005 (H) <150 mg/dL   HDL 24 (L) >11 mg/dL   Total CHOL/HDL Ratio 8.2 RATIO   VLDL 54 (H) 0 - 40 mg/dL   LDL Cholesterol 021 (H) 0 - 99 mg/dL    Comment:        Total Cholesterol/HDL:CHD Risk Coronary Heart Disease Risk Table                     Men   Women  1/2 Average Risk   3.4   3.3  Average Risk       5.0   4.4  2 X Average Risk   9.6   7.1  3 X Average Risk  23.4   11.0        Use the calculated Patient Ratio above and the CHD Risk Table to determine the patient's CHD Risk.        ATP III CLASSIFICATION (LDL):  <100     mg/dL   Optimal  117-356  mg/dL   Near or Above                    Optimal  130-159  mg/dL   Borderline  701-410  mg/dL   High  >301     mg/dL   Very High Performed at [facility], [address],  [place], [place] [address]   TSH     Status: None   Collection Time: 06/03/18  9:30 AM  Result Value Ref Range   TSH 1.819 0.350 - 4.500 uIU/mL    Comment: Performed by a 3rd Generation assay with a functional sensitivity of <=0.01 uIU/mL. Performed at [facility], [address], [place], [place] [address]   CBG monitoring, ED     Status: Abnormal   Collection Time: 06/03/18  1:18 PM  Result Value Ref Range   Glucose-Capillary 144 (H) 70 - 99 mg/dL   Ct Head Wo Contrast  Result Date: 06/03/2018 CLINICAL DATA:  Possible stroke.  Left-sided weakness EXAM:  CT HEAD WITHOUT CONTRAST TECHNIQUE: Contiguous axial images were obtained from the base of the skull through the vertex without intravenous contrast. COMPARISON:  None. FINDINGS: Brain: Hypodensity right thalamus compatible infarct of indeterminate age. Hypodensity left lateral basal ganglia compatible with infarct which is likely chronic. Negative for hemorrhage or mass.  Ventricle size normal. Vascular: Negative for hyperdense vessel Skull: Negative Sinuses/Orbits: Mild mucosal edema paranasal sinuses. Other: None IMPRESSION: Infarct right thalamus could be subacute or chronic. Infarct left lateral basal ganglia probably chronic. Negative for hemorrhage. Electronically Signed   By: Marlan Palau M.D.   On: 06/03/2018 10:45    Pending Labs Unresulted Labs (From admission, onward)    Start     Ordered   06/03/18 1139  HIV antibody (Routine Testing)  Once,   R     06/03/18 1138   06/03/18 1130  Hemoglobin A1c  Add-on,   R     06/03/18 1129          Vitals/Pain Today's Vitals   06/03/18 1152 06/03/18 1200 06/03/18 1230 06/03/18 1300  BP: (!) 146/75 (!) 145/73 (!) 153/92 (!) 142/74  Pulse: 70 73 79 72  Resp: 18 17 19 20   SpO2: 95% 96% 93% 93%  PainSc:        Isolation Precautions No active isolations  Medications Medications  sodium chloride flush (NS) 0.9 % injection 3 mL (3 mLs Intravenous Not Given 06/03/18 1141)  fenofibrate tablet 160 mg (160 mg Oral Not Given 06/03/18 1153)  Fish Oil CAPS 2,400 mg (2,400 mg Oral Not Given 06/03/18 1153)  Vitamin D3 CAPS 5,000 Units (5,000 Units Oral Not Given 06/03/18 1153)  clopidogrel (PLAVIX) tablet 75 mg (75 mg Oral Not Given 06/03/18 1154)  atorvastatin (LIPITOR) tablet 40 mg (40 mg Oral Not Given 06/03/18 1154)  sodium chloride flush (NS) 0.9 % injection 3 mL (3 mLs Intravenous Not Given 06/03/18 1154)  sodium chloride flush (NS) 0.9 % injection 3 mL (has no administration in time range)  0.9 %  sodium chloride infusion (has no administration  in time range)  acetaminophen (TYLENOL) tablet 650 mg (has no administration in time range)    Or  acetaminophen (TYLENOL) suppository 650 mg (has no administration in time range)  traZODone (DESYREL) tablet 50 mg (has no administration in time range)  polyethylene glycol (MIRALAX / GLYCOLAX) packet 17 g (has no administration in time range)  ondansetron (ZOFRAN) tablet 4 mg (has no administration in time range)    Or  ondansetron (ZOFRAN) injection 4 mg (has no administration in time range)  albuterol (PROVENTIL) (2.5 MG/3ML) 0.083% nebulizer solution 2.5 mg (has no administration in time range)  heparin injection 5,000 Units (5,000 Units Subcutaneous Given 06/03/18 1324)  0.9 %  sodium chloride infusion ( Intravenous New Bag/Given 06/03/18 1329)  insulin aspart (novoLOG) injection 0-6 Units (  0 Units Subcutaneous Not Given 06/03/18 1328)  insulin aspart (novoLOG) injection 0-5 Units (has no administration in time range)  aspirin tablet 325 mg (325 mg Oral Given 06/03/18 1324)    Mobility walks

## 2018-06-04 ENCOUNTER — Inpatient Hospital Stay (HOSPITAL_COMMUNITY): Payer: BLUE CROSS/BLUE SHIELD

## 2018-06-04 DIAGNOSIS — I639 Cerebral infarction, unspecified: Secondary | ICD-10-CM

## 2018-06-04 LAB — GLUCOSE, CAPILLARY
Glucose-Capillary: 113 mg/dL — ABNORMAL HIGH (ref 70–99)
Glucose-Capillary: 123 mg/dL — ABNORMAL HIGH (ref 70–99)
Glucose-Capillary: 149 mg/dL — ABNORMAL HIGH (ref 70–99)

## 2018-06-04 LAB — ECHOCARDIOGRAM COMPLETE
Height: 71 in
Weight: 3728.42 oz

## 2018-06-04 MED ORDER — METFORMIN HCL 500 MG PO TABS: ORAL_TABLET | ORAL | 2 refills | Status: DC

## 2018-06-04 MED ORDER — ASPIRIN EC 81 MG PO TBEC: 81.0000 mg | DELAYED_RELEASE_TABLET | Freq: Every day | ORAL | 0 refills | Status: AC

## 2018-06-04 MED ORDER — CLOPIDOGREL BISULFATE 75 MG PO TABS: 75.0000 mg | ORAL_TABLET | Freq: Every day | ORAL | 3 refills | Status: DC

## 2018-06-04 MED ORDER — LISINOPRIL 10 MG PO TABS: 10.0000 mg | ORAL_TABLET | Freq: Every day | ORAL | 3 refills | Status: DC

## 2018-06-04 MED ORDER — ACETAMINOPHEN 325 MG PO TABS: 650.0000 mg | ORAL_TABLET | Freq: Four times a day (QID) | ORAL | 1 refills | Status: AC | PRN

## 2018-06-04 MED ORDER — ATORVASTATIN CALCIUM 40 MG PO TABS: 40.0000 mg | ORAL_TABLET | Freq: Every evening | ORAL | 4 refills | Status: AC

## 2018-06-04 NOTE — Progress Notes (Signed)
Discharge instructions reviewed with patient and wife.  Both verbalized understanding of stroke education and discharge education.  Patient discharged home with wife in stable condition.

## 2018-06-04 NOTE — Progress Notes (Signed)
SLP Cancellation Note  Patient Details Name: Javier Walker MRN: [id number] DOB: Oct 12, 1950   Cancelled treatment:       Reason Eval/Treat Not Completed: SLP screened, no needs identified, will sign off; Pt passed the Yale swallow screen with nursing and has been consuming a regular diet with thin liquids without incident. Pt observed drinking thin water via straw sips in room without signs of symptoms of aspiration. SLP screened Pt in room. Pt denies any changes in swallowing, speech, language, or cognition. MRI shows: 1 cm acute infarction in the right thalamus. Mild chronic small vessel insults elsewhere as described. No evidence of hemorrhage or mass effect.[name] SLE was not ordered, BSE was ordered however will be deferred at this time. Reconsult if indicated. SLP will sign off. Above discussed with RN and MD.  Thank you,  Havery Moros, CCC-SLP [phone]     Connee Ikner 06/04/2018, 1:29 PM

## 2018-06-04 NOTE — Progress Notes (Signed)
OT Screen Note  Patient Details Name: Aqeel Relyea MRN: [id number] DOB: Aug 19, 1950   Cancelled Treatment:    Reason Eval/Treat Not Completed: OT screened, no needs identified, will sign off. Patient is functioning at baseline independent level. No reports of numbness, tingling or weakness on left UE. MMT: 5/5. Functional fine and gross motor coordination. No follow up OT services needed.   Limmie Patricia, OTR/L,CBIS  [phone]  06/04/2018, 9:03 AM

## 2018-06-04 NOTE — Discharge Instructions (Signed)
1)Take Plavix 75 mg with Aspirin 81 daily until seen by Neurologist 2)Take Lipitor/atorvastatin 40 mg every evening for cholesterol and stroke prevention 3)Please take all your medications as prescribed, call your primary care physician or your neurologist if any concerns or side effects 4) follow-up with neurologist Dr. Beryle Beams in 3 to 4 weeks for recheck----- Neurologist in [place], [place] Address: [address], [place], [place] [address] Phone: [phone]  5)You are taking aspirin and Plavix which are blood thinners so please Avoid ibuprofen/Advil/Aleve/Motrin/Goody Powders/Naproxen/BC powders/Meloxicam/Diclofenac/Indomethacin and other Nonsteroidal anti-inflammatory medications as these will make you more likely to bleed and can cause stomach ulcers, can also cause Kidney problems.   6) your diabetes is out of control with A1c of 8.4------ you need to eat better (avoid sugars/sweets and avoid excessive amounts of starch/carbohydrates ) and exercise regularly in order to improve your diabetic control

## 2018-06-04 NOTE — Progress Notes (Signed)
*  PRELIMINARY RESULTS* Echocardiogram 2D Echocardiogram has been performed.  Javier Walker 06/04/2018, 10:25 AM

## 2018-06-04 NOTE — Discharge Summary (Signed)
Javier Walker, is a 68 y.o. male  DOB 1950/08/23  MRN [id number].  Admission date:  06/03/2018  Admitting Physician  Shon Hale, MD  Discharge Date:  06/04/2018   Primary MD  Assunta Found, MD  Recommendations for primary care physician for things to follow:   1)Take Plavix 75 mg with Aspirin 81 daily until seen by Neurologist 2)Take Lipitor/atorvastatin 40 mg every evening for cholesterol and stroke prevention 3)Please take all your medications as prescribed, call your primary care physician or your neurologist if any concerns or side effects 4) follow-up with neurologist Dr. Beryle Beams in 3 to 4 weeks for recheck----- Neurologist in [place], [place] Address: [address], [place], [place] [address] Phone: [phone]  5)You are taking aspirin and Plavix which are blood thinners so please Avoid ibuprofen/Advil/Aleve/Motrin/Goody Powders/Naproxen/BC powders/Meloxicam/Diclofenac/Indomethacin and other Nonsteroidal anti-inflammatory medications as these will make you more likely to bleed and can cause stomach ulcers, can also cause Kidney problems.   6) your diabetes is out of control with A1c of 8.4------ you need to eat better (avoid sugars/sweets and avoid excessive amounts of starch/carbohydrates ) and exercise regularly in order to improve your diabetic control   Admission Diagnosis  Syncope and collapse [R55] Cerebrovascular accident (CVA), unspecified mechanism (HCC) [I63.9]   Discharge Diagnosis  Syncope and collapse [R55] Cerebrovascular accident (CVA), unspecified mechanism (HCC) [I63.9]    Principal Problem:   CVA (cerebral vascular accident)/Rt Thalamic Stroke Active Problems:   Type 2 diabetes mellitus with stage 3 chronic kidney disease, with long-term current use of insulin (HCC)   Essential hypertension, benign   Mixed hyperlipidemia      Past Medical  History:  Diagnosis Date  . Diabetes mellitus without complication (HCC)   . GERD (gastroesophageal reflux disease)    occ-no meds  . Hives    patient has hives with anxiety  . Hyperlipemia   . Hypertension   . Shortness of breath     Past Surgical History:  Procedure Laterality Date  . CHOLECYSTECTOMY N/A 08/26/2016   Procedure: CHOLECYSTECTOMY;  Surgeon: Franky Macho, MD;  Location: [facility];  Service: General;  Laterality: N/A;  . COLON SURGERY  2011   gangrene lower colon with colostomy  . COLOSTOMY REVERSAL  2011  . ERCP N/A 08/17/2016   Procedure: ENDOSCOPIC RETROGRADE CHOLANGIOPANCREATOGRAPHY (ERCP) Biliary sphincterotomy and stone extraction;  Surgeon: Malissa Hippo, MD;  Location: [facility];  Service: Endoscopy;  Laterality: N/A;  . OPEN REDUCTION INTERNAL FIXATION (ORIF) TIBIA/FIBULA FRACTURE Right 08/30/2012   Procedure: OPEN REDUCTION INTERNAL FIXATION (ORIF) TIBIA/FIBULA FRACTURE;  Surgeon: Toni Arthurs, MD;  Location: [facility];  Service: Orthopedics;  Laterality: Right;  . WOUND DEBRIDEMENT  2011   abd-     HPI  from the history and physical done on the day of admission:    Javier Walker  is a 68 y.o. male past medical history relevant for poorly controlled diabetes, obesity,  hypertension and dyslipidemia presents to the ED with more than 48-hour history  of left-sided numbness and weakness, gait disturbance and facial asymmetry.... 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 on 06/01/2018 he noticed left-sided weakness, facial numbness and gait disturbance----  Around 4 AM on 06/02/2018 his symptoms somewhat got worse-----especially gait  When he arrived back in [place] his wife noticed that his gait was off  Additional history obtained from patient's wife at bedside--- No chest pains, no palpitations, no dizziness, no leg pains, no leg swelling no pleuritic symptoms  No headaches, no visual  disturbance, he denies significant speech or swallowing disturbance  No fevers, no chills, no cough or other URI symptoms  In ED--- CT head was suggestive of subacute right thalamic infarct and old left basal ganglia infarcts  BP was somewhat elevated   Hospital Course:     1)Subacute Right Thalamic infarct/CVA -patient presented more than 48 hours after onset of symptoms, no significant arrhythmias noted on telemetry monitored unit,  treated  with aspirin and plavix (patient was on aspirin 81 mg PTA, suspect aspirin monotherapy failure),  treated with Lipitor, CT head and MRI brain, MRA head with 1 cm acute infarction in the right thalamus and old left basal ganglia strokes, no large or medium vessel occlusion,,  patient will continue aspirin 81 mg and Plavix 35 mg daily until seen by neurologist in about 3 weeks at which time consideration to should be given to dropping the aspirin and continue Plavix monotherapy .  Discussed with on-call neurologist Dr. Beryle Beams--- who will see patient as outpatient in about 3 weeks also. carotid artery Dopplers without hemodynamically significant stenosis.  Physical therapy eval appreciated, echocardiogram without intracardiac thrombus, EF is 50 to 55%, with grade 2 diastolic dysfunction, no regional wall motion abnormalities.  Initially we allowed some permissive hypertension due to acute stroke, may resume antihypertensives in a.m.,  TSH is 1.8, A1c is pending and fasting lipid profile shows LDL of 119, HDL of 24 and triglycerides of 271--- Even if his lipid panel is within desired limits, patient should still take Statin for it's Pleiotropic effects (beyond cholesterol lowering benefits).[name]   2)DM2--- patient A1c was 8.6, repeat A1c is 8.4, reflecting poor diabetic control, need to be more aggressive with lifestyle and dietary modifications emphasized to patient and wife restart home insulin regimen and metformin, follow-up with PCP for ongoing diabetic  management    3)HTN-- we allowed some permissive Hypertension initially due to acute stroke,  may resume BP meds in a.m.  4)CKD III--- renal function appears to be stable at this time with a creatinine of 1.25, suspect CKD secondary to underlying hypertension and diabetes, avoid nephrotoxic agents   Patient was admitted with neurological deficits, clinical exam and neuro imaging studies confirmed acute CVA/stroke----given patient's presenting symptoms, clinical exam, diagnosis and comorbid conditions patient meets criteria for inpatient stay however patient improved above and beyond what would be expected given his initial exam and diagnosis..... Patient was discharged home with less than 2 midnight stay due to dramatic medical improvement above and beyond the usual expected course for his acute frontoparietal stroke and neuro deficits given his comorbid conditions    Discharge Condition: stable  Follow UP--- neurologist Dr. Gerilyn Pilgrim   Consults obtained -phone consultation with neurologist  Diet and Activity recommendation:  As advised  Discharge Instructions    Discharge Instructions    Call MD for:  difficulty breathing, headache or visual disturbances   Complete by:  As directed  Call MD for:  persistant dizziness or light-headedness   Complete by:  As directed    Call MD for:  persistant nausea and vomiting   Complete by:  As directed    Call MD for:  severe uncontrolled pain   Complete by:  As directed    Call MD for:  temperature >100.4   Complete by:  As directed    Diet - low sodium heart healthy   Complete by:  As directed    Diet Carb Modified   Complete by:  As directed    Discharge instructions   Complete by:  As directed    1)Take Plavix 75 mg with Aspirin 81 daily until seen by Neurologist 2)Take Lipitor/atorvastatin 40 mg every evening for cholesterol and stroke prevention 3)Please take all your medications as prescribed, call your primary care physician or  your neurologist if any concerns or side effects 4) follow-up with neurologist Dr. Beryle Beams in 3 to 4 weeks for recheck----- Neurologist in [place], [place] Address: [address], [place], [place] [address] Phone: [phone]  5)You are taking aspirin and Plavix which are blood thinners so please Avoid ibuprofen/Advil/Aleve/Motrin/Goody Powders/Naproxen/BC powders/Meloxicam/Diclofenac/Indomethacin and other Nonsteroidal anti-inflammatory medications as these will make you more likely to bleed and can cause stomach ulcers, can also cause Kidney problems.   6) your diabetes is out of control with A1c of 8.4------ you need to eat better (avoid sugars/sweets and avoid excessive amounts of starch/carbohydrates ) and exercise regularly in order to improve your diabetic control   Increase activity slowly   Complete by:  As directed         Discharge Medications     Allergies as of 06/04/2018      Reactions   Augmentin [amoxicillin-pot Clavulanate] Rash      Medication List    TAKE these medications   acetaminophen 325 MG tablet Commonly known as:  TYLENOL Take 2 tablets (650 mg total) by mouth every 6 (six) hours as needed for mild pain, fever or headache (or Fever >/= 101).   aspirin EC 81 MG tablet Take 1 tablet (81 mg total) by mouth daily with breakfast. Take with Plavix 75 mg daily until seen by neurologist   atorvastatin 40 MG tablet Commonly known as:  LIPITOR Take 1 tablet (40 mg total) by mouth every evening.   clopidogrel 75 MG tablet Commonly known as:  PLAVIX Take 1 tablet (75 mg total) by mouth daily. Take with aspirin 81 mg daily until seen by neurologist Start taking on:  June 05, 2018   fenofibrate 145 MG tablet Commonly known as:  TRICOR TAKE 1 TABLET BY MOUTH ONCE DAILY   Fish Oil 1200 MG Caps Take 2,400 mg by mouth daily.   insulin degludec 100 UNIT/ML Sopn FlexTouch Pen Commonly known as:  TRESIBA FLEXTOUCH Inject 0.5 mLs  (50 Units total) into the skin at bedtime.   lisinopril 10 MG tablet Commonly known as:  PRINIVIL,ZESTRIL Take 1 tablet (10 mg total) by mouth daily. Restart on 06/06/18 What changed:  additional instructions   metFORMIN 500 MG tablet Commonly known as:  GLUCOPHAGE TAKE 1 TABLET BY MOUTH TWICE DAILY WITH A MEAL--- Restart on 06/06/18 What changed:  additional instructions   Vitamin D3 125 MCG (5000 UT) Caps Take 1 capsule (5,000 Units total) by mouth daily.       Major procedures and Radiology Reports - PLEASE review detailed and final reports for all details, in brief -   Ct  Head Wo Contrast  Result Date: 06/03/2018 CLINICAL DATA:  Possible stroke.  Left-sided weakness EXAM: CT HEAD WITHOUT CONTRAST TECHNIQUE: Contiguous axial images were obtained from the base of the skull through the vertex without intravenous contrast. COMPARISON:  None. FINDINGS: Brain: Hypodensity right thalamus compatible infarct of indeterminate age. Hypodensity left lateral basal ganglia compatible with infarct which is likely chronic. Negative for hemorrhage or mass.  Ventricle size normal. Vascular: Negative for hyperdense vessel Skull: Negative Sinuses/Orbits: Mild mucosal edema paranasal sinuses. Other: None IMPRESSION: Infarct right thalamus could be subacute or chronic. Infarct left lateral basal ganglia probably chronic. Negative for hemorrhage. Electronically Signed   By: Marlan Palau M.D.   On: 06/03/2018 10:45   Mr Maxine Glenn Head Wo Contrast  Result Date: 06/04/2018 CLINICAL DATA:  Left-sided weakness over the last 3 days. EXAM: MRI HEAD WITHOUT CONTRAST MRA HEAD WITHOUT CONTRAST TECHNIQUE: Multiplanar, multiecho pulse sequences of the brain and surrounding structures were obtained without intravenous contrast. Angiographic images of the head were obtained using MRA technique without contrast. COMPARISON:  CT 06/03/2018 FINDINGS: MRI HEAD FINDINGS Brain: Diffusion imaging shows a 1 cm acute infarction in the  right thalamus. No other acute infarction. Brainstem and cerebellum are normal. Cerebral hemispheres elsewhere show mild chronic small-vessel ischemic change of the white matter an old lacunar infarction in the left caudate and left external capsule. No large vessel territory infarction. No mass lesion, hemorrhage, hydrocephalus or extra-axial collection. Vascular: Major vessels at the base of the brain show flow. Skull and upper cervical spine: Negative Sinuses/Orbits: Seasonal mucosal inflammatory changes of the sinuses. Orbits negative. Other: None MRA HEAD FINDINGS Both internal carotid arteries are patent through the skull base and siphon regions. The anterior and middle cerebral vessels are patent. The right A1 segment is diminutive, presumably congenital, though possibly acquired. Diminished signal in the left middle cerebral artery presumed secondary to a segment downward vessel course. Both vertebral arteries are patent to the basilar. No basilar stenosis. Posterior circulation branch vessels are patent. Right PCA takes fetal origin from the anterior circulation. There is atherosclerotic narrowing and irregularity the PCA branches. IMPRESSION: 1 cm acute infarction in the right thalamus. Mild chronic small vessel insults elsewhere as described. No evidence of hemorrhage or mass effect. No large or medium vessel occlusion is identified. The patient does have atherosclerotic change of the more distal MCA branch vessels. Electronically Signed   By: Paulina Fusi M.D.   On: 06/04/2018 09:00   Mr Brain Wo Contrast  Result Date: 06/04/2018 CLINICAL DATA:  Left-sided weakness over the last 3 days. EXAM: MRI HEAD WITHOUT CONTRAST MRA HEAD WITHOUT CONTRAST TECHNIQUE: Multiplanar, multiecho pulse sequences of the brain and surrounding structures were obtained without intravenous contrast. Angiographic images of the head were obtained using MRA technique without contrast. COMPARISON:  CT 06/03/2018 FINDINGS: MRI  HEAD FINDINGS Brain: Diffusion imaging shows a 1 cm acute infarction in the right thalamus. No other acute infarction. Brainstem and cerebellum are normal. Cerebral hemispheres elsewhere show mild chronic small-vessel ischemic change of the white matter an old lacunar infarction in the left caudate and left external capsule. No large vessel territory infarction. No mass lesion, hemorrhage, hydrocephalus or extra-axial collection. Vascular: Major vessels at the base of the brain show flow. Skull and upper cervical spine: Negative Sinuses/Orbits: Seasonal mucosal inflammatory changes of the sinuses. Orbits negative. Other: None MRA HEAD FINDINGS Both internal carotid arteries are patent through the skull base and siphon regions. The anterior and middle cerebral vessels are patent.  The right A1 segment is diminutive, presumably congenital, though possibly acquired. Diminished signal in the left middle cerebral artery presumed secondary to a segment downward vessel course. Both vertebral arteries are patent to the basilar. No basilar stenosis. Posterior circulation branch vessels are patent. Right PCA takes fetal origin from the anterior circulation. There is atherosclerotic narrowing and irregularity the PCA branches. IMPRESSION: 1 cm acute infarction in the right thalamus. Mild chronic small vessel insults elsewhere as described. No evidence of hemorrhage or mass effect. No large or medium vessel occlusion is identified. The patient does have atherosclerotic change of the more distal MCA branch vessels. Electronically Signed   By: Paulina Fusi M.D.   On: 06/04/2018 09:00   US Carotid Bilateral  Result Date: 06/04/2018 CLINICAL DATA:  68 year old male with left-sided weakness 4 days previously secondary to acute right thalamic infarct. EXAM: BILATERAL CAROTID DUPLEX ULTRASOUND TECHNIQUE: Wallace Cullens scale imaging, color Doppler and duplex ultrasound were performed of bilateral carotid and vertebral arteries in the neck.  COMPARISON:  Brain MRI 06/04/2018 FINDINGS: Criteria: Quantification of carotid stenosis is based on velocity parameters that correlate the residual internal carotid diameter with NASCET-based stenosis levels, using the diameter of the distal internal carotid lumen as the denominator for stenosis measurement. The following velocity measurements were obtained: RIGHT ICA: 81/12 cm/sec CCA: 62/8 cm/sec SYSTOLIC ICA/CCA RATIO:  1.3 ECA:  82 cm/sec LEFT ICA: 96/24 cm/sec CCA: 67/15 cm/sec SYSTOLIC ICA/CCA RATIO:  1.4 ECA:  78 cm/sec RIGHT CAROTID ARTERY: Focal moderate heterogeneous atherosclerotic plaque in the proximal internal carotid artery. By peak systolic velocity criteria, the estimated stenosis remains less than 50%. RIGHT VERTEBRAL ARTERY:  Patent with normal antegrade flow. LEFT CAROTID ARTERY: Moderate focal heterogeneous atherosclerotic plaque in the proximal internal carotid artery. By peak systolic velocity criteria, the estimated stenosis remains less than 50%. LEFT VERTEBRAL ARTERY:  Patent with antegrade flow. IMPRESSION: 1. Mild (1-49%) stenosis proximal right internal carotid artery secondary to moderate focal heterogeneous atherosclerotic plaque. 2. Mild (1-49%) stenosis proximal left internal carotid artery secondary to moderate focal heterogeneous atherosclerotic plaque. 3. Vertebral arteries are patent with normal antegrade flow. Signed, Sterling Big, MD, RPVI [facility] Electronically Signed   By: Malachy Moan M.D.   On: 06/04/2018 11:24    Micro Results    Today   Subjective    Donivan Thammavong today has no new complaints, neuro deficits appears to be improving, as per wife at bedside patient is getting close to his baseline          Patient has been seen and examined prior to discharge   Objective   Blood pressure 121/69, pulse 71, temperature 97.7 F (36.5 C), temperature source Oral, resp. rate 19, height  5\' 11"  (1.803 m), weight 105.7 kg, SpO2 97 %.   Intake/Output Summary (Last 24 hours) at 06/04/2018 1657 Last data filed at 06/04/2018 1300 Gross per 24 hour  Intake 1456.1 ml  Output -  Net 1456.1 ml    Exam Physical Examination: General appearance - alert and in no distress  Mental status - alert, oriented to person, place, and time,  Eyes - sclera anicteric Neck - supple, no JVD elevation , Chest - clear  to auscultation bilaterally, symmetrical air movement,  Heart - S1 and S2 normal, regular  Abdomen - soft, nontender, nondistended, no masses or organomegaly Extremities - no pedal edema noted, intact peripheral pulses  Skin - warm, dry Neuro--- Neurologial Exam:screening mental status exam normal, neck  supple without rigidity--- Mental Status: Patient is awake, alert, oriented to person, place, month, year, and situation. Patient is able to give a clear and coherent history. No signs of aphasia or neglect Cranial Nerves: II: Visual Fields are full. Pupils are equal, round, and reactive to light.  III,IV, VI: EOMI without ptosis or diploplia.  V: Facial sensation isimproved on the left VII: Facial movement with improved weakness and mostly resolved droop on the left VIII: hearing is intact to voice X: Uvula elevates symmetrically XI: Shoulder shrug is symmetric. XII: tongue is midline without atrophy or fasciculations.  Motor: Tone is normal. Bulk is normal.  Left-sided hemiparesis with muscle strength left lower extremity is improved significantly, left upper extremity muscle strength is improved significantly,  muscle strength is 5/5 on the right Sensory: Sensation isnormal to pinprick and to touch Cerebellar: FNFwithout ataxia    Data Review   CBC w Diff:  Lab Results  Component Value Date   WBC 9.2 06/03/2018   HGB 13.8 06/03/2018   HCT 43.2 06/03/2018   PLT 232 06/03/2018   LYMPHOPCT 41 06/03/2018   MONOPCT 8 06/03/2018   EOSPCT 4 06/03/2018    BASOPCT 0 06/03/2018    CMP:  Lab Results  Component Value Date   NA 136 06/03/2018   K 4.4 06/03/2018   CL 102 06/03/2018   CO2 26 06/03/2018   BUN 20 06/03/2018   CREATININE 1.25 (H) 06/03/2018   CREATININE 1.22 02/27/2018   PROT 7.6 06/03/2018   ALBUMIN 3.8 06/03/2018   BILITOT 0.9 06/03/2018   ALKPHOS 34 (L) 06/03/2018   AST 34 06/03/2018   ALT 37 06/03/2018  .   Total Discharge time is about 33 minutes  Shon Hale M.D on 06/04/2018 at 4:57 PM  Go to [email or web address] -  for contact info  [facility] - Office  [phone]

## 2018-06-04 NOTE — Evaluation (Signed)
Physical Therapy Evaluation Patient Details Name: Javier Walker MRN: [id number] DOB: 10/06/1950 Today's Date: 06/04/2018   History of Present Illness  Javier Walker  is a 68 y.o. male past medical history relevant for poorly controlled diabetes, obesity,  hypertension and dyslipidemia presents to the ED with more than 48-hour history of left-sided numbness and weakness, gait disturbance and facial asymmetry....    Clinical Impression  Patient functioning at baseline for functional mobility and gait and patient states all symptoms have resolved.  Plan:  Patient discharged from physical therapy to care of nursing for ambulation daily as tolerated for length of stay.    Follow Up Recommendations No PT follow up    Equipment Recommendations  None recommended by PT    Recommendations for Other Services       Precautions / Restrictions Precautions Precautions: None Restrictions Weight Bearing Restrictions: No      Mobility  Bed Mobility Overal bed mobility: Independent                Transfers Overall transfer level: Independent                  Ambulation/Gait Ambulation/Gait assistance: Modified independent (Device/Increase time) Gait Distance (Feet): 200 Feet Assistive device: None Gait Pattern/deviations: WFL(Within Functional Limits) Gait velocity: near normal   General Gait Details: no loss of balance on level, inclined or declined surfaces, patient speed of cadence slightly decreased due to old ankle fractures per patient  Stairs            Wheelchair Mobility    Modified Rankin (Stroke Patients Only)       Balance Overall balance assessment: No apparent balance deficits (not formally assessed)                                           Pertinent Vitals/Pain Pain Assessment: No/denies pain    Home Living Family/patient expects to be discharged to:: Private residence Living Arrangements: Spouse/significant  other Available Help at Discharge: Family Type of Home: House Home Access: Ramped entrance     Home Layout: One level Home Equipment: Environmental consultant - 4 wheels;Cane - single point;Bedside commode;Shower seat - built in      Prior Function Level of Independence: Independent         Comments: Tourist information centre manager, works as a Public librarian: Right    Extremity/Trunk Assessment   Upper Extremity Assessment Upper Extremity Assessment: Defer to OT evaluation    Lower Extremity Assessment Lower Extremity Assessment: Overall WFL for tasks assessed    Cervical / Trunk Assessment Cervical / Trunk Assessment: Normal  Communication   Communication: No difficulties  Cognition Arousal/Alertness: Awake/alert Behavior During Therapy: WFL for tasks assessed/performed Overall Cognitive Status: Within Functional Limits for tasks assessed                                        General Comments      Exercises     Assessment/Plan    PT Assessment Patent does not need any further PT services  PT Problem List         PT Treatment Interventions      PT Goals (Current goals can be found in the Care Plan section)  Acute Rehab  PT Goals Patient Stated Goal: return home PT Goal Formulation: With patient/family Time For Goal Achievement: 06/04/18 Potential to Achieve Goals: Good    Frequency     Barriers to discharge        Co-evaluation               AM-PAC PT "6 Clicks" Mobility  Outcome Measure Help needed turning from your back to your side while in a flat bed without using bedrails?: None Help needed moving from lying on your back to sitting on the side of a flat bed without using bedrails?: None Help needed moving to and from a bed to a chair (including a wheelchair)?: None Help needed standing up from a chair using your arms (e.g., wheelchair or bedside chair)?: None Help needed to walk in hospital room?: None Help  needed climbing 3-5 steps with a railing? : None 6 Click Score: 24    End of Session   Activity Tolerance: Patient tolerated treatment well Patient left: in bed;with call bell/phone within reach;with family/visitor present Nurse Communication: Mobility status PT Visit Diagnosis: Unsteadiness on feet (R26.81);Other abnormalities of gait and mobility (R26.89);Muscle weakness (generalized) (M62.81)    Time: 9794-8016 PT Time Calculation (min) (ACUTE ONLY): 15 min   Charges:   PT Evaluation $PT Eval Low Complexity: 1 Low PT Treatments $Gait Training: 8-22 mins        12:28 PM, 06/04/18 Ocie Bob, MPT Physical Therapist with [facility] [phone] office [phone] mobile phone

## 2018-06-05 LAB — HIV ANTIBODY (ROUTINE TESTING W REFLEX): HIV Screen 4th Generation wRfx: NONREACTIVE

## 2018-06-08 DIAGNOSIS — E1129 Type 2 diabetes mellitus with other diabetic kidney complication: Secondary | ICD-10-CM | POA: Diagnosis not present

## 2018-06-08 DIAGNOSIS — I639 Cerebral infarction, unspecified: Secondary | ICD-10-CM | POA: Diagnosis not present

## 2018-06-08 DIAGNOSIS — Z6833 Body mass index (BMI) 33.0-33.9, adult: Secondary | ICD-10-CM | POA: Diagnosis not present

## 2018-06-08 DIAGNOSIS — M720 Palmar fascial fibromatosis [Dupuytren]: Secondary | ICD-10-CM | POA: Diagnosis not present

## 2018-06-18 DIAGNOSIS — I6602 Occlusion and stenosis of left middle cerebral artery: Secondary | ICD-10-CM | POA: Diagnosis not present

## 2018-06-18 DIAGNOSIS — G466 Pure sensory lacunar syndrome: Secondary | ICD-10-CM | POA: Diagnosis not present

## 2018-06-18 DIAGNOSIS — E1159 Type 2 diabetes mellitus with other circulatory complications: Secondary | ICD-10-CM | POA: Diagnosis not present

## 2018-06-18 DIAGNOSIS — E782 Mixed hyperlipidemia: Secondary | ICD-10-CM | POA: Diagnosis not present

## 2018-07-02 DIAGNOSIS — E1122 Type 2 diabetes mellitus with diabetic chronic kidney disease: Secondary | ICD-10-CM | POA: Diagnosis not present

## 2018-07-02 DIAGNOSIS — N183 Chronic kidney disease, stage 3 (moderate): Secondary | ICD-10-CM | POA: Diagnosis not present

## 2018-07-02 DIAGNOSIS — Z794 Long term (current) use of insulin: Secondary | ICD-10-CM | POA: Diagnosis not present

## 2018-07-02 DIAGNOSIS — E559 Vitamin D deficiency, unspecified: Secondary | ICD-10-CM | POA: Diagnosis not present

## 2018-07-03 LAB — COMPLETE METABOLIC PANEL WITH GFR
AG Ratio: 1.5 (calc) (ref 1.0–2.5)
ALBUMIN MSPROF: 4.1 g/dL (ref 3.6–5.1)
ALKALINE PHOSPHATASE (APISO): 37 U/L (ref 35–144)
ALT: 26 U/L (ref 9–46)
AST: 22 U/L (ref 10–35)
BILIRUBIN TOTAL: 0.5 mg/dL (ref 0.2–1.2)
BUN: 16 mg/dL (ref 7–25)
CHLORIDE: 103 mmol/L (ref 98–110)
CO2: 27 mmol/L (ref 20–32)
CREATININE: 1.11 mg/dL (ref 0.70–1.25)
Calcium: 9.2 mg/dL (ref 8.6–10.3)
GFR, EST AFRICAN AMERICAN: 79 mL/min/{1.73_m2} (ref >=60)
GFR, Est Non African American: 68 mL/min/{1.73_m2} (ref >=60)
GLUCOSE: 162 mg/dL — AB (ref 65–99)
Globulin: 2.8 g/dL (calc) (ref 1.9–3.7)
Potassium: 4.4 mmol/L (ref 3.5–5.3)
SODIUM: 138 mmol/L (ref 135–146)
TOTAL PROTEIN: 6.9 g/dL (ref 6.1–8.1)

## 2018-07-03 LAB — HEMOGLOBIN A1C
EAG (MMOL/L): 10.9 (calc)
Hgb A1c MFr Bld: 8.5 % of total Hgb — ABNORMAL HIGH (ref <5.7)
Mean Plasma Glucose: 197 (calc)

## 2018-07-03 LAB — VITAMIN D 25 HYDROXY (VIT D DEFICIENCY, FRACTURES): Vit D, 25-Hydroxy: 43 ng/mL (ref 30–100)

## 2018-07-18 ENCOUNTER — Ambulatory Visit (INDEPENDENT_AMBULATORY_CARE_PROVIDER_SITE_OTHER): Payer: BLUE CROSS/BLUE SHIELD | Admitting: "Endocrinology

## 2018-07-18 ENCOUNTER — Other Ambulatory Visit: Payer: Self-pay

## 2018-07-18 ENCOUNTER — Encounter: Payer: Self-pay | Admitting: "Endocrinology

## 2018-07-18 ENCOUNTER — Other Ambulatory Visit: Payer: Self-pay | Admitting: "Endocrinology

## 2018-07-18 DIAGNOSIS — E1122 Type 2 diabetes mellitus with diabetic chronic kidney disease: Secondary | ICD-10-CM

## 2018-07-18 DIAGNOSIS — Z794 Long term (current) use of insulin: Secondary | ICD-10-CM

## 2018-07-18 DIAGNOSIS — E782 Mixed hyperlipidemia: Secondary | ICD-10-CM | POA: Diagnosis not present

## 2018-07-18 DIAGNOSIS — N183 Chronic kidney disease, stage 3 (moderate): Secondary | ICD-10-CM

## 2018-07-18 MED ORDER — INSULIN DEGLUDEC 100 UNIT/ML ~~LOC~~ SOPN: 60.0000 [IU] | PEN_INJECTOR | Freq: Every day | SUBCUTANEOUS | 3 refills | Status: DC

## 2018-07-18 NOTE — Progress Notes (Signed)
Endocrinology Telephone Visit Follow up Note -During COVID -19 Pandemic   Subjective:    Patient ID: Javier Walker, male    DOB: 1951-01-05. Patient is being engaged in telephone visit for follow-up of management of diabetes, Hypertension, hyperlipidemia.   Past Medical History:  Diagnosis Date  . Diabetes mellitus without complication (HCC)   . GERD (gastroesophageal reflux disease)    occ-no meds  . Hives    patient has hives with anxiety  . Hyperlipemia   . Hypertension   . Shortness of breath    Past Surgical History:  Procedure Laterality Date  . CHOLECYSTECTOMY N/A 08/26/2016   Procedure: CHOLECYSTECTOMY;  Surgeon: Franky Macho, MD;  Location: [facility];  Service: General;  Laterality: N/A;  . COLON SURGERY  2011   gangrene lower colon with colostomy  . COLOSTOMY REVERSAL  2011  . ERCP N/A 08/17/2016   Procedure: ENDOSCOPIC RETROGRADE CHOLANGIOPANCREATOGRAPHY (ERCP) Biliary sphincterotomy and stone extraction;  Surgeon: Malissa Hippo, MD;  Location: [facility];  Service: Endoscopy;  Laterality: N/A;  . OPEN REDUCTION INTERNAL FIXATION (ORIF) TIBIA/FIBULA FRACTURE Right 08/30/2012   Procedure: OPEN REDUCTION INTERNAL FIXATION (ORIF) TIBIA/FIBULA FRACTURE;  Surgeon: Toni Arthurs, MD;  Location: [facility];  Service: Orthopedics;  Laterality: Right;  . WOUND DEBRIDEMENT  2011   abd-   Social History   Socioeconomic History  . Marital status: Married    Spouse name: Not on file  . Number of children: Not on file  . Years of education: Not on file  . Highest education level: Not on file  Occupational History  . Not on file  Social Needs  . Financial resource strain: Patient refused  . Food insecurity:    Worry: Patient refused    Inability: Patient refused  . Transportation needs:    Medical: Patient refused    Non-medical: Patient refused  Tobacco Use  . Smoking status: Former Smoker   Packs/day: 2.00    Years: 15.00    Pack years: 30.00    Types: Cigarettes    Last attempt to quit: 08/27/1988    Years since quitting: 29.9  . Smokeless tobacco: Never Used  Substance and Sexual Activity  . Alcohol use: Yes    Comment: rarely  . Drug use: No  . Sexual activity: Yes    Birth control/protection: None  Lifestyle  . Physical activity:    Days per week: Patient refused    Minutes per session: Patient refused  . Stress: Patient refused  Relationships  . Social connections:    Talks on phone: Patient refused    Gets together: Patient refused    Attends religious service: Patient refused    Active member of club or organization: Patient refused    Attends meetings of clubs or organizations: Patient refused    Relationship status: Patient refused  Other Topics Concern  . Not on file  Social History Narrative  . Not on file   Outpatient Encounter Medications as of 07/18/2018  Medication Sig  . acetaminophen (TYLENOL) 325 MG tablet Take 2 tablets (650 mg total) by mouth every 6 (six) hours as needed for mild pain, fever or headache (or Fever >/= 101).  [name]  aspirin EC 81 MG tablet Take 1 tablet (81 mg total) by mouth daily with breakfast. Take with Plavix 75 mg daily until seen by neurologist  . atorvastatin (LIPITOR) 40 MG tablet Take 1 tablet (40 mg total) by mouth every evening.  . Cholecalciferol (VITAMIN D3) 125 MCG (5000 UT) CAPS Take 1 capsule (5,000 Units total) by mouth daily.  . clopidogrel (PLAVIX) 75 MG tablet Take 1 tablet (75 mg total) by mouth daily. Take with aspirin 81 mg daily until seen by neurologist  . fenofibrate (TRICOR) 145 MG tablet TAKE 1 TABLET BY MOUTH ONCE DAILY  . insulin degludec (TRESIBA FLEXTOUCH) 100 UNIT/ML SOPN FlexTouch Pen Inject 0.6 mLs (60 Units total) into the skin at bedtime.  [name] lisinopril (PRINIVIL,ZESTRIL) 10 MG tablet Take 1 tablet (10 mg total) by mouth daily. Restart on 06/06/18  . metFORMIN (GLUCOPHAGE) 500 MG tablet TAKE 1  TABLET BY MOUTH TWICE DAILY WITH A MEAL--- Restart on 06/06/18  . Omega-3 Fatty Acids (FISH OIL) 1200 MG CAPS Take 2,400 mg by mouth daily.  . [DISCONTINUED] insulin degludec (TRESIBA FLEXTOUCH) 100 UNIT/ML SOPN FlexTouch Pen Inject 0.5 mLs (50 Units total) into the skin at bedtime.   No facility-administered encounter medications on file as of 07/18/2018.    ALLERGIES: Allergies  Allergen Reactions  . Augmentin [Amoxicillin-Pot Clavulanate] Rash   VACCINATION STATUS:  There is no immunization history on file for this patient.  Diabetes  He presents for his follow-up diabetic visit. He has type 2 diabetes mellitus. Onset time: He was diagnosed at approximate age of 54 years. His disease course has been worsening. There are no hypoglycemic associated symptoms. Pertinent negatives for hypoglycemia include no confusion, pallor or seizures. Pertinent negatives for diabetes include no fatigue, no polydipsia, no polyphagia, no polyuria and no weakness. There are no hypoglycemic complications. Symptoms are worsening. There are no diabetic complications. Risk factors for coronary artery disease include diabetes mellitus, dyslipidemia, hypertension, male sex, obesity, tobacco exposure and sedentary lifestyle. Current diabetic treatment includes oral agent (dual therapy). He is compliant with treatment most of the time. He is following a generally unhealthy diet. When asked about meal planning, he reported none. He has not had a previous visit with a dietitian. He never participates in exercise. His breakfast blood glucose range is generally 140-180 mg/dl. His bedtime blood glucose range is generally 140-180 mg/dl. His overall blood glucose range is 140-180 mg/dl. An ACE inhibitor/angiotensin II receptor blocker is being taken. Eye exam is current.  Hyperlipidemia  This is a chronic problem. The current episode started more than 1 year ago. The problem is uncontrolled. Exacerbating diseases include diabetes and  obesity. Pertinent negatives include no myalgias. Current antihyperlipidemic treatment includes statins. Risk factors for coronary artery disease include diabetes mellitus, dyslipidemia, hypertension, male sex, obesity and a sedentary lifestyle.     Objective:    There were no vitals taken for this visit.  Wt Readings from Last 3 Encounters:  06/03/18 233 lb 0.4 oz (105.7 kg)  03/12/18 234 lb (106.1 kg)  11/02/17 230 lb (104.3 kg)    CMP  Lipid Panel     Component Value Date/Time   CHOL 197 06/03/2018 0930   TRIG 271 (H) 06/03/2018 0930   HDL 24 (L) 06/03/2018 0930   CHOLHDL 8.2 06/03/2018 0930   VLDL 54 (H) 06/03/2018 0930   LDLCALC 119 (H) 06/03/2018 0930   LDLCALC  02/27/2018 0709     Comment:     . LDL cholesterol not calculated. Triglyceride  levels greater than 400 mg/dL invalidate calculated LDL results. . Reference range: <100 . Desirable range <100 mg/dL for primary prevention;   <70 mg/dL for patients with CHD or diabetic patients  with > or = 2 CHD risk factors. [name] LDL-C is now calculated using the Martin-Hopkins  calculation, which is a validated novel method providing  better accuracy than the Friedewald equation in the  estimation of LDL-C.  Horald Pollen et al. Lenox Ahr. 1191;478(29): 2061-2068  (http://education.[facility].com/faq/FAQ164)    Recent Results (from the past 2160 hour(s))  CBG monitoring, ED     Status: Abnormal   Collection Time: 06/03/18  9:25 AM  Result Value Ref Range   Glucose-Capillary 164 (H) 70 - 99 mg/dL  Protime-INR     Status: None   Collection Time: 06/03/18  9:30 AM  Result Value Ref Range   Prothrombin Time 14.0 11.4 - 15.2 seconds   INR 1.09     Comment: Performed at [facility], [address] [place], [place] [address]  APTT     Status: None   Collection Time: 06/03/18  9:30 AM  Result Value Ref Range   aPTT 31 24 - 36 seconds    Comment: Performed at [facility], [address], [place], [place] [address]  CBC      Status: None   Collection Time: 06/03/18  9:30 AM  Result Value Ref Range   WBC 9.2 4.0 - 10.5 K/uL   RBC 4.90 4.22 - 5.81 MIL/uL   Hemoglobin 13.8 13.0 - 17.0 g/dL   HCT 84.6 96.2 - 95.2 %   MCV 88.2 80.0 - 100.0 fL   MCH 28.2 26.0 - 34.0 pg   MCHC 31.9 30.0 - 36.0 g/dL   RDW 84.1 32.4 - 40.1 %   Platelets 232 150 - 400 K/uL   nRBC 0.0 0.0 - 0.2 %    Comment: Performed at [facility], [address], [place], [place] [address]  Differential     Status: None   Collection Time: 06/03/18  9:30 AM  Result Value Ref Range   Neutrophils Relative % 47 %   Neutro Abs 4.2 1.7 - 7.7 K/uL   Lymphocytes Relative 41 %   Lymphs Abs 3.8 0.7 - 4.0 K/uL   Monocytes Relative 8 %   Monocytes Absolute 0.7 0.1 - 1.0 K/uL   Eosinophils Relative 4 %   Eosinophils Absolute 0.4 0.0 - 0.5 K/uL   Basophils Relative 0 %   Basophils Absolute 0.0 0.0 - 0.1 K/uL   Immature Granulocytes 0 %   Abs Immature Granulocytes 0.02 0.00 - 0.07 K/uL    Comment: Performed at [facility], [address], [place], [place] [address]  Comprehensive metabolic panel     Status: Abnormal   Collection Time: 06/03/18  9:30 AM  Result Value Ref Range   Sodium 136 135 - 145 mmol/L   Potassium 4.4 3.5 - 5.1 mmol/L   Chloride 102 98 - 111 mmol/L   CO2 26 22 - 32 mmol/L   Glucose, Bld 176 (H) 70 - 99 mg/dL   BUN 20 8 - 23 mg/dL   Creatinine, Ser 0.34 (H) 0.61 - 1.24 mg/dL   Calcium 9.4 8.9 - 74.2 mg/dL   Total Protein 7.6 6.5 - 8.1 g/dL   Albumin 3.8 3.5 - 5.0 g/dL   AST 34 15 - 41 U/L   ALT 37 0 - 44 U/L   Alkaline Phosphatase 34 (L) 38 - 126 U/L   Total Bilirubin  0.9 0.3 - 1.2 mg/dL   GFR calc non Af Amer 59 (L) >60 mL/min   GFR calc Af Amer >60 >60 mL/min   Anion gap 8 5 - 15    Comment: Performed at [facility], [address], [place], [place] [address]  Lipid panel     Status: Abnormal   Collection Time: 06/03/18  9:30 AM  Result Value Ref Range   Cholesterol 197 0 - 200 mg/dL   Triglycerides 505 (H) <150 mg/dL    HDL 24 (L) >69 mg/dL   Total CHOL/HDL Ratio 8.2 RATIO   VLDL 54 (H) 0 - 40 mg/dL   LDL Cholesterol 794 (H) 0 - 99 mg/dL    Comment:        Total Cholesterol/HDL:CHD Risk Coronary Heart Disease Risk Table                     Men   Women  1/2 Average Risk   3.4   3.3  Average Risk       5.0   4.4  2 X Average Risk   9.6   7.1  3 X Average Risk  23.4   11.0        Use the calculated Patient Ratio above and the CHD Risk Table to determine the patient's CHD Risk.        ATP III CLASSIFICATION (LDL):  <100     mg/dL   Optimal  801-655  mg/dL   Near or Above                    Optimal  130-159  mg/dL   Borderline  374-827  mg/dL   High  >078     mg/dL   Very High Performed at [facility], [address], [place], [place] [address]   TSH     Status: None   Collection Time: 06/03/18  9:30 AM  Result Value Ref Range   TSH 1.819 0.350 - 4.500 uIU/mL    Comment: Performed by a 3rd Generation assay with a functional sensitivity of <=0.01 uIU/mL. Performed at [facility], [address], [place], [place] [address]   Hemoglobin A1c     Status: Abnormal   Collection Time: 06/03/18  9:30 AM  Result Value Ref Range   Hgb A1c MFr Bld 8.4 (H) 4.8 - 5.6 %    Comment: (NOTE) Pre diabetes:          5.7%-6.4% Diabetes:              >6.4% Glycemic control for   <7.0% adults with diabetes    Mean Plasma Glucose 194.38 mg/dL    Comment: Performed at [facility], [address], [place], [place] [address]  CBG monitoring, ED     Status: Abnormal   Collection Time: 06/03/18  1:18 PM  Result Value Ref Range   Glucose-Capillary 144 (H) 70 - 99 mg/dL  Glucose, capillary     Status: Abnormal   Collection Time: 06/03/18  5:24 PM  Result Value Ref Range   Glucose-Capillary 102 (H) 70 - 99 mg/dL  Glucose, capillary     Status: Abnormal   Collection Time: 06/03/18  9:37 PM  Result Value Ref Range   Glucose-Capillary 161 (H) 70 - 99 mg/dL   Comment 1 Notify RN    Comment 2 Document in  Chart   HIV antibody (Routine Testing)     Status: None   Collection Time:  06/04/18  4:57 AM  Result Value Ref Range   HIV Screen 4th Generation wRfx Non Reactive Non Reactive    Comment: (NOTE) Performed At: [facility] [address] [place], [place] [id number] Jolene Schimke MD JX:[id number]   Glucose, capillary     Status: Abnormal   Collection Time: 06/04/18  7:58 AM  Result Value Ref Range   Glucose-Capillary 113 (H) 70 - 99 mg/dL  ECHOCARDIOGRAM COMPLETE     Status: None   Collection Time: 06/04/18 10:25 AM  Result Value Ref Range   Weight 3,728.42 oz   Height 71 in   BP 124/69 mmHg  Glucose, capillary     Status: Abnormal   Collection Time: 06/04/18 11:12 AM  Result Value Ref Range   Glucose-Capillary 123 (H) 70 - 99 mg/dL  Glucose, capillary     Status: Abnormal   Collection Time: 06/04/18  4:18 PM  Result Value Ref Range   Glucose-Capillary 149 (H) 70 - 99 mg/dL  Hemoglobin Z3Y     Status: Abnormal   Collection Time: 07/02/18  7:23 AM  Result Value Ref Range   Hgb A1c MFr Bld 8.5 (H) <5.7 % of total Hgb    Comment: For someone without known diabetes, a hemoglobin A1c value of 6.5% or greater indicates that they may have  diabetes and this should be confirmed with a follow-up  test. . For someone with known diabetes, a value <7% indicates  that their diabetes is well controlled and a value  greater than or equal to 7% indicates suboptimal  control. A1c targets should be individualized based on  duration of diabetes, age, comorbid conditions, and  other considerations. . Currently, no consensus exists regarding use of hemoglobin A1c for diagnosis of diabetes for children. .    Mean Plasma Glucose 197 (calc)   eAG (mmol/L) 10.9 (calc)  COMPLETE METABOLIC PANEL WITH GFR     Status: Abnormal   Collection Time: 07/02/18  7:23 AM  Result Value Ref Range   Glucose, Bld 162 (H) 65 - 99 mg/dL    Comment: .            Fasting reference interval . For  someone without known diabetes, a glucose value >125 mg/dL indicates that they may have diabetes and this should be confirmed with a follow-up test. .    BUN 16 7 - 25 mg/dL   Creat 8.65 7.84 - 6.96 mg/dL    Comment: For patients >31 years of age, the reference limit for Creatinine is approximately 13% higher for people identified as African-American. .    GFR, Est Non African American 68 > OR = 60 mL/min/1.27m2   GFR, Est African American 79 > OR = 60 mL/min/1.65m2   BUN/Creatinine Ratio NOT APPLICABLE 6 - 22 (calc)   Sodium 138 135 - 146 mmol/L   Potassium 4.4 3.5 - 5.3 mmol/L   Chloride 103 98 - 110 mmol/L   CO2 27 20 - 32 mmol/L   Calcium 9.2 8.6 - 10.3 mg/dL   Total Protein 6.9 6.1 - 8.1 g/dL   Albumin 4.1 3.6 - 5.1 g/dL   Globulin 2.8 1.9 - 3.7 g/dL (calc)   AG Ratio 1.5 1.0 - 2.5 (calc)   Total Bilirubin 0.5 0.2 - 1.2 mg/dL   Alkaline phosphatase (APISO) 37 35 - 144 U/L   AST 22 10 - 35 U/L   ALT 26 9 - 46 U/L  VITAMIN D 25 Hydroxy (Vit-D Deficiency, Fractures)  Status: None   Collection Time: 07/02/18  7:23 AM  Result Value Ref Range   Vit D, 25-Hydroxy 43 30 - 100 ng/mL    Comment: Vitamin D Status         25-OH Vitamin D: . Deficiency:                    <20 ng/mL Insufficiency:             20 - 29 ng/mL Optimal:                 > or = 30 ng/mL . For 25-OH Vitamin D testing on patients on  D2-supplementation and patients for whom quantitation  of D2 and D3 fractions is required, the QuestAssureD(TM) 25-OH VIT D, (D2,D3), LC/MS/MS is recommended: order  code [id number] (patients >689yrs). . For more information on this test, go to: http://education.quest[facility].com/faq/FAQ163 (This link is being provided for  informational/educational purposes only.)       Assessment & Plan:   1. Uncontrolled type 2 diabetes mellitus with CKD - Patient has currently uncontrolled symptomatic type 2 DM since  68 years of age. -  His previsit labs show A1c of 8.5%,  unchanged from his last visit A1c of 8.4% in February 2020.   - His diabetes is complicated by worsening CKD, obesity/sedentary life and patient remains at a high risk for more acute and chronic complications of diabetes which include CAD, CVA, CKD, retinopathy, and neuropathy. These are all discussed in detail with the patient.  - I have counseled the patient on diet management and weight loss, by adopting a carbohydrate restricted/protein rich diet. -He admits to dietary discretions including consumption of butter and sweets.  - Patient admits there is a room for improvement in his diet and drink choices. -  Suggestion is made for him to avoid simple carbohydrates  from his diet including Cakes, Sweet Desserts / Pastries, Ice Cream, Soda (diet and regular), Sweet Tea, Candies, Chips, Cookies, Store Bought Juices, Alcohol in Excess of  1-2 drinks a day, Artificial Sweeteners, and "Sugar-free" Products. This will help patient to have stable blood glucose profile and potentially avoid unintended weight gain.  - Patient is advised to stick to a routine mealtimes to eat 3 meals  a day and avoid unnecessary snacks ( to snack only to correct hypoglycemia).   - I have approached patient with the following individualized plan to manage diabetes and patient agrees:   -Based on his blood glucose profile, which are slightly above target, he will tolerate higher dose of basal insulin.   -He is advised to increase his [place] to 60 units nightly,   associated with strict monitoring of blood glucose 2 times a day-before breakfast and at bedtime.   -His renal function is back to normal, advised to continue low-dose metformin at 500 mg p.o. twice daily.    2) Lipids/HPL: His previsit fasting lipid panel showed hypertriglyceridemia 582 increasing from 189.  He was taken off of statins due to transaminitis.  He is liver enzymes are back to normal now.  He will benefit from a higher dose of omega-3 fatty acids.  I  discussed and increased his omega-3 fatty acids to 2 g p.o. twice daily along with his fenofibrate.   3) vitamin D deficiency -He is on ongoing supplement with vitamin D3, 5000 units daily.     - Patient Care Time Today:  25 min, of which >50% was spent in reviewing his  current  and  previous labs/studies, previous treatments, and medications doses and developing a plan for long-term care based on the latest recommendations for standards of care.  Javier Walker participated in the discussions, expressed understanding, and voiced agreement with the above plans.  All questions were answered to his satisfaction. he is encouraged to contact clinic should he have any questions or concerns prior to his return visit.  Follow up plan: - Return in about 4 months (around 11/17/2018) for Follow up with Pre-visit Labs, Meter, and Logs.  Marquis Lunch, MD Phone: [phone]  Fax: [phone]  This note was partially dictated with voice recognition software. Similar sounding words can be transcribed inadequately or may not  be corrected upon review.  07/18/2018, 12:22 PM

## 2018-07-23 ENCOUNTER — Ambulatory Visit: Payer: Medicare Other | Admitting: "Endocrinology

## 2018-08-02 ENCOUNTER — Other Ambulatory Visit: Payer: Self-pay | Admitting: "Endocrinology

## 2018-08-09 ENCOUNTER — Other Ambulatory Visit: Payer: Self-pay | Admitting: "Endocrinology

## 2018-08-14 ENCOUNTER — Other Ambulatory Visit: Payer: Self-pay

## 2018-08-14 MED ORDER — INSULIN DEGLUDEC 100 UNIT/ML ~~LOC~~ SOPN: 60.0000 [IU] | PEN_INJECTOR | Freq: Every day | SUBCUTANEOUS | 3 refills | Status: DC

## 2018-08-21 DIAGNOSIS — Z1389 Encounter for screening for other disorder: Secondary | ICD-10-CM | POA: Diagnosis not present

## 2018-08-21 DIAGNOSIS — Z0000 Encounter for general adult medical examination without abnormal findings: Secondary | ICD-10-CM | POA: Diagnosis not present

## 2018-08-21 DIAGNOSIS — Z681 Body mass index (BMI) 19 or less, adult: Secondary | ICD-10-CM | POA: Diagnosis not present

## 2018-08-22 IMAGING — CR DG CHEST 1V PORT
1 series · 1 of 1 positions shown · non-contrast
Comparison: 05/19/2009

CLINICAL DATA: Shortness of breath and abdominal pain

EXAM:
PORTABLE CHEST 1 VIEW

[portable]
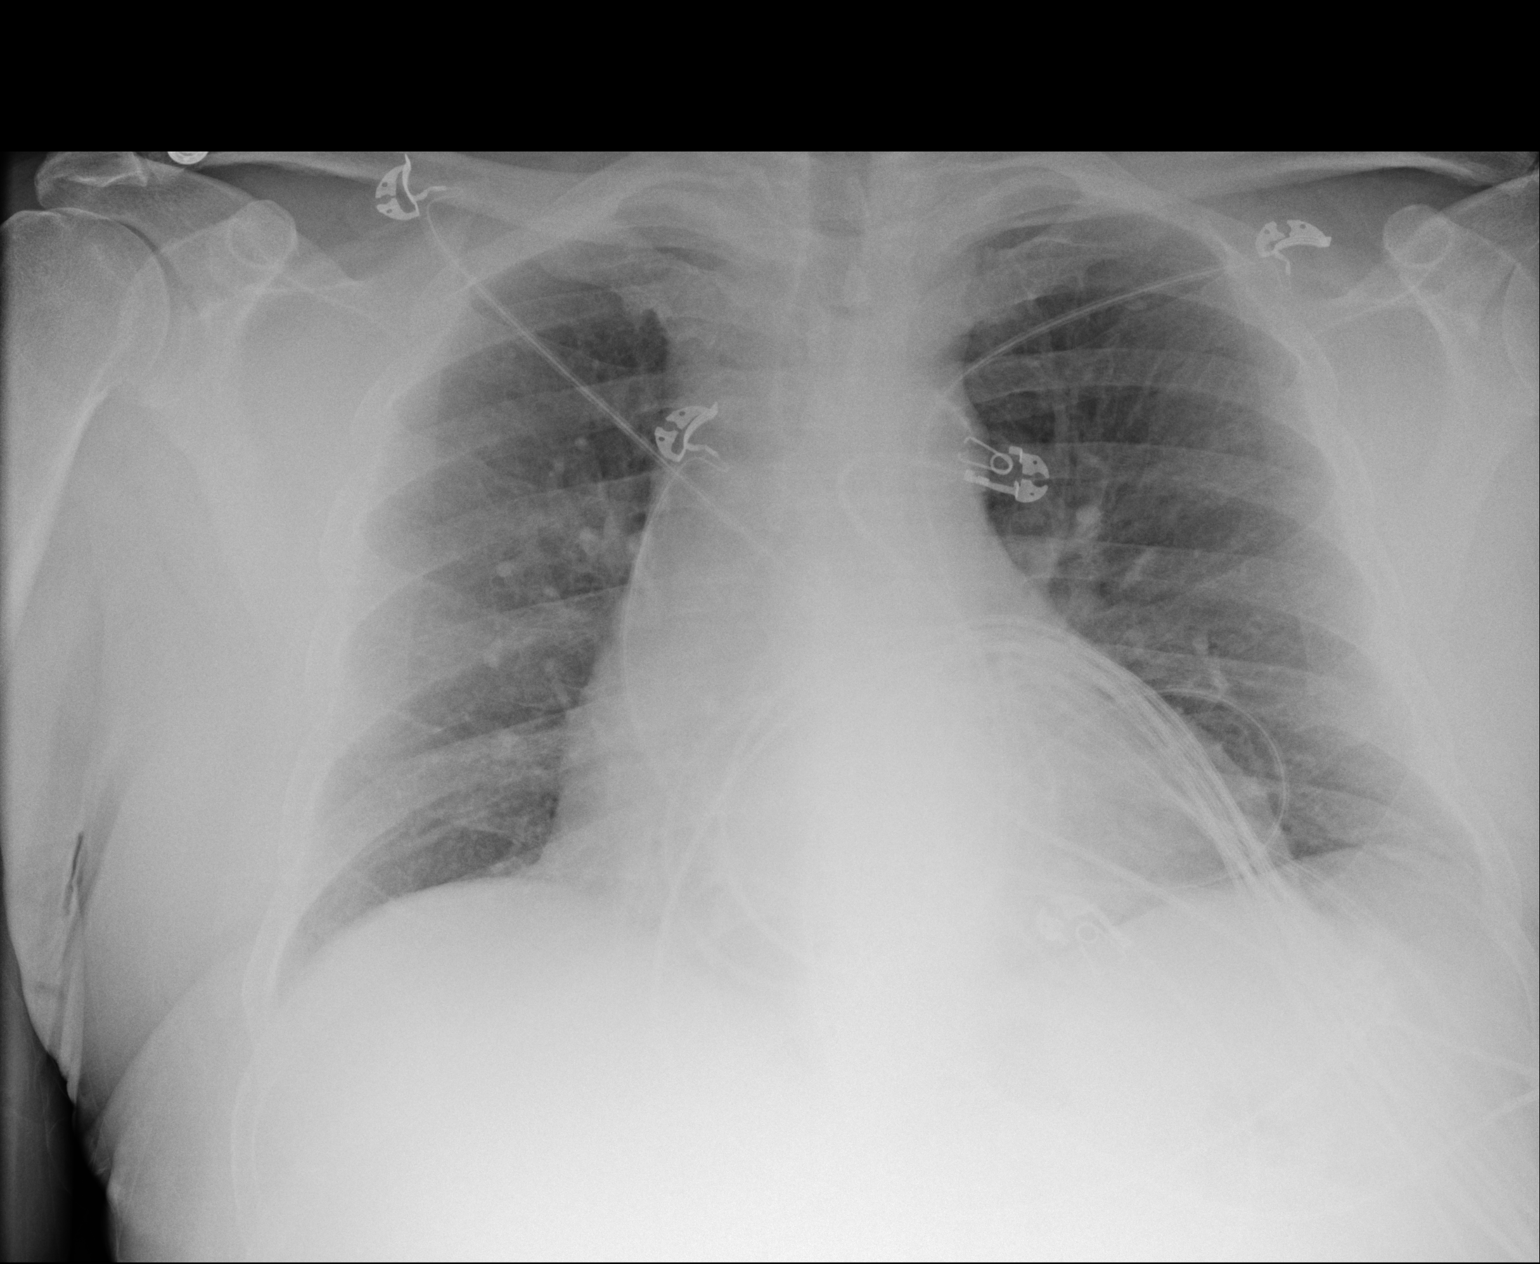

[1 of 1 positions shown; findings below may reference images not displayed]

FINDINGS: Cardiac shadow is at the upper limits of normal in size accentuated
by the portable technique. Lungs are clear bilaterally. No acute
bony abnormalities is noted.
IMPRESSION: No active disease.

## 2018-08-31 DIAGNOSIS — Z23 Encounter for immunization: Secondary | ICD-10-CM | POA: Diagnosis not present

## 2018-09-04 ENCOUNTER — Telehealth: Payer: Self-pay

## 2018-09-04 DIAGNOSIS — E1122 Type 2 diabetes mellitus with diabetic chronic kidney disease: Secondary | ICD-10-CM

## 2018-09-04 DIAGNOSIS — N183 Chronic kidney disease, stage 3 unspecified: Secondary | ICD-10-CM

## 2018-09-04 DIAGNOSIS — E782 Mixed hyperlipidemia: Secondary | ICD-10-CM

## 2018-09-04 DIAGNOSIS — E559 Vitamin D deficiency, unspecified: Secondary | ICD-10-CM

## 2018-09-04 MED ORDER — METFORMIN HCL 500 MG PO TABS: ORAL_TABLET | ORAL | 1 refills | Status: DC

## 2018-09-04 MED ORDER — VITAMIN D3 125 MCG (5000 UT) PO CAPS: 5000.0000 [IU] | ORAL_CAPSULE | Freq: Every day | ORAL | 0 refills | Status: AC

## 2018-09-04 MED ORDER — FENOFIBRATE 145 MG PO TABS: 145.0000 mg | ORAL_TABLET | Freq: Every day | ORAL | 0 refills | Status: DC

## 2018-09-04 MED ORDER — INSULIN DEGLUDEC 100 UNIT/ML ~~LOC~~ SOPN: 60.0000 [IU] | PEN_INJECTOR | Freq: Every day | SUBCUTANEOUS | 3 refills | Status: DC

## 2018-09-04 NOTE — Telephone Encounter (Signed)
LeighAnn Drew Lips, CMA  

## 2018-09-04 NOTE — Telephone Encounter (Signed)
LeighAnn Marnee Sherrard, CMA  

## 2018-09-11 DIAGNOSIS — I6602 Occlusion and stenosis of left middle cerebral artery: Secondary | ICD-10-CM | POA: Diagnosis not present

## 2018-09-11 DIAGNOSIS — E782 Mixed hyperlipidemia: Secondary | ICD-10-CM | POA: Diagnosis not present

## 2018-09-11 DIAGNOSIS — G466 Pure sensory lacunar syndrome: Secondary | ICD-10-CM | POA: Diagnosis not present

## 2018-09-11 DIAGNOSIS — E1159 Type 2 diabetes mellitus with other circulatory complications: Secondary | ICD-10-CM | POA: Diagnosis not present

## 2018-11-14 DIAGNOSIS — N183 Chronic kidney disease, stage 3 (moderate): Secondary | ICD-10-CM | POA: Diagnosis not present

## 2018-11-14 DIAGNOSIS — Z794 Long term (current) use of insulin: Secondary | ICD-10-CM | POA: Diagnosis not present

## 2018-11-14 DIAGNOSIS — E1122 Type 2 diabetes mellitus with diabetic chronic kidney disease: Secondary | ICD-10-CM | POA: Diagnosis not present

## 2018-11-15 LAB — COMPLETE METABOLIC PANEL WITH GFR
AG Ratio: 1.3 (calc) (ref 1.0–2.5)
ALT: 32 U/L (ref 9–46)
AST: 33 U/L (ref 10–35)
Albumin: 4.1 g/dL (ref 3.6–5.1)
Alkaline phosphatase (APISO): 33 U/L — ABNORMAL LOW (ref 35–144)
BUN: 21 mg/dL (ref 7–25)
CO2: 29 mmol/L (ref 20–32)
Calcium: 9.9 mg/dL (ref 8.6–10.3)
Chloride: 103 mmol/L (ref 98–110)
Creat: 1.17 mg/dL (ref 0.70–1.25)
GFR, Est African American: 74 mL/min/{1.73_m2} (ref >=60)
GFR, Est Non African American: 64 mL/min/{1.73_m2} (ref >=60)
Globulin: 3.1 g/dL (calc) (ref 1.9–3.7)
Glucose, Bld: 146 mg/dL — ABNORMAL HIGH (ref 65–99)
Potassium: 4.9 mmol/L (ref 3.5–5.3)
Sodium: 139 mmol/L (ref 135–146)
Total Bilirubin: 0.7 mg/dL (ref 0.2–1.2)
Total Protein: 7.2 g/dL (ref 6.1–8.1)

## 2018-11-15 LAB — HEMOGLOBIN A1C
Hgb A1c MFr Bld: 8.3 % of total Hgb — ABNORMAL HIGH (ref <5.7)
Mean Plasma Glucose: 192 (calc)
eAG (mmol/L): 10.6 (calc)

## 2018-11-21 ENCOUNTER — Other Ambulatory Visit: Payer: Self-pay | Admitting: "Endocrinology

## 2018-11-21 DIAGNOSIS — N183 Chronic kidney disease, stage 3 unspecified: Secondary | ICD-10-CM

## 2018-11-21 DIAGNOSIS — E1122 Type 2 diabetes mellitus with diabetic chronic kidney disease: Secondary | ICD-10-CM

## 2018-11-26 ENCOUNTER — Other Ambulatory Visit: Payer: Self-pay

## 2018-11-26 ENCOUNTER — Encounter: Payer: Self-pay | Admitting: "Endocrinology

## 2018-11-26 ENCOUNTER — Ambulatory Visit (INDEPENDENT_AMBULATORY_CARE_PROVIDER_SITE_OTHER): Payer: PPO | Admitting: "Endocrinology

## 2018-11-26 DIAGNOSIS — E1165 Type 2 diabetes mellitus with hyperglycemia: Secondary | ICD-10-CM | POA: Diagnosis not present

## 2018-11-26 DIAGNOSIS — E782 Mixed hyperlipidemia: Secondary | ICD-10-CM

## 2018-11-26 DIAGNOSIS — E559 Vitamin D deficiency, unspecified: Secondary | ICD-10-CM

## 2018-11-26 DIAGNOSIS — Z794 Long term (current) use of insulin: Secondary | ICD-10-CM | POA: Diagnosis not present

## 2018-11-26 DIAGNOSIS — N183 Chronic kidney disease, stage 3 unspecified: Secondary | ICD-10-CM

## 2018-11-26 DIAGNOSIS — I10 Essential (primary) hypertension: Secondary | ICD-10-CM

## 2018-11-26 DIAGNOSIS — E1122 Type 2 diabetes mellitus with diabetic chronic kidney disease: Secondary | ICD-10-CM

## 2018-11-26 MED ORDER — GLIPIZIDE ER 5 MG PO TB24: 5.0000 mg | ORAL_TABLET | Freq: Every day | ORAL | 3 refills | Status: DC

## 2018-11-26 MED ORDER — TRESIBA FLEXTOUCH 100 UNIT/ML ~~LOC~~ SOPN: PEN_INJECTOR | SUBCUTANEOUS | 3 refills | Status: DC

## 2018-11-26 MED ORDER — METFORMIN HCL 500 MG PO TABS: ORAL_TABLET | ORAL | 1 refills | Status: DC

## 2018-11-26 NOTE — Progress Notes (Signed)
11/26/2018                                                    Endocrinology Telehealth Visit Follow up Note -During COVID -19 Pandemic  This visit type was conducted due to national recommendations for restrictions regarding the COVID-19 Pandemic  in an effort to limit this patient's exposure and mitigate transmission of the corona virus.  Due to his co-morbid illnesses, Javier Walker is at  moderate to high risk for complications without adequate follow up.  This format is felt to be most appropriate for him at this time.  I connected with this patient on 11/26/2018   by telephone and verified that I am speaking with the correct person using two identifiers. Javier Walker, May 22, 1950. he has verbally consented to this visit. All issues noted in this document were discussed and addressed. The format was not optimal for physical exam.    Subjective:    Patient ID: Javier Walker, male    DOB: Aug 03, 1950. Patient is being engaged in telehealth via telephone for follow-up of management of diabetes, Hypertension, hyperlipidemia.   Past Medical History:  Diagnosis Date  . Diabetes mellitus without complication ([place])   . GERD (gastroesophageal reflux disease)    occ-no meds  . Hives    patient has hives with anxiety  . Hyperlipemia   . Hypertension   . Shortness of breath    Past Surgical History:  Procedure Laterality Date  . CHOLECYSTECTOMY N/A 08/26/2016   Procedure: CHOLECYSTECTOMY;  Surgeon: Aviva Signs, MD;  Location: [facility];  Service: General;  Laterality: N/A;  . COLON SURGERY  2011   gangrene lower colon with colostomy  . COLOSTOMY REVERSAL  2011  . ERCP N/A 08/17/2016   Procedure: ENDOSCOPIC RETROGRADE CHOLANGIOPANCREATOGRAPHY (ERCP) Biliary sphincterotomy and stone extraction;  Surgeon: Rogene Houston, MD;  Location: [facility];  Service: Endoscopy;  Laterality: N/A;  . OPEN REDUCTION INTERNAL FIXATION (ORIF) TIBIA/FIBULA FRACTURE Right 08/30/2012   Procedure: OPEN  REDUCTION INTERNAL FIXATION (ORIF) TIBIA/FIBULA FRACTURE;  Surgeon: Wylene Simmer, MD;  Location: [facility];  Service: Orthopedics;  Laterality: Right;  . WOUND DEBRIDEMENT  2011   abd-   Social History   Socioeconomic History  . Marital status: Married    Spouse name: Not on file  . Number of children: Not on file  . Years of education: Not on file  . Highest education level: Not on file  Occupational History  . Not on file  Social Needs  . Financial resource strain: Patient refused  . Food insecurity    Worry: Patient refused    Inability: Patient refused  . Transportation needs    Medical: Patient refused    Non-medical: Patient refused  Tobacco Use  . Smoking status: Former Smoker    Packs/day: 2.00    Years: 15.00    Pack years: 30.00    Types: Cigarettes    Quit date: 08/27/1988    Years since quitting: 30.2  . Smokeless tobacco: Never Used  Substance and Sexual Activity  . Alcohol use: Yes    Comment: rarely  . Drug use: No  . Sexual activity: Yes    Birth control/protection: None  Lifestyle  . Physical activity    Days per week: Patient refused    Minutes per session: Patient refused  .  Stress: Patient refused  Relationships  . Social Musicianconnections    Talks on phone: Patient refused    Gets together: Patient refused    Attends religious service: Patient refused    Active member of club or organization: Patient refused    Attends meetings of clubs or organizations: Patient refused    Relationship status: Patient refused  Other Topics Concern  . Not on file  Social History Narrative  . Not on file   Outpatient Encounter Medications as of 11/26/2018  Medication Sig  . acetaminophen (TYLENOL) 325 MG tablet Take 2 tablets (650 mg total) by mouth every 6 (six) hours as needed for mild pain, fever or headache (or Fever >/= 101).  [name]. aspirin EC 81 MG tablet Take 1 tablet (81 mg total) by mouth daily with breakfast. Take with Plavix 75 mg daily until  seen by neurologist  . atorvastatin (LIPITOR) 40 MG tablet Take 1 tablet (40 mg total) by mouth every evening.  . Cholecalciferol (VITAMIN D3) 125 MCG (5000 UT) CAPS Take 1 capsule (5,000 Units total) by mouth daily.  . clopidogrel (PLAVIX) 75 MG tablet Take 1 tablet (75 mg total) by mouth daily. Take with aspirin 81 mg daily until seen by neurologist  . fenofibrate (TRICOR) 145 MG tablet Take 1 tablet (145 mg total) by mouth daily.  [name]. glipiZIDE (GLUCOTROL XL) 5 MG 24 hr tablet Take 1 tablet (5 mg total) by mouth daily with breakfast.  . insulin degludec (TRESIBA FLEXTOUCH) 100 UNIT/ML SOPN FlexTouch Pen INJECT 60 UNITS INTO THE SKIN AT BEDTIME.  [name]. lisinopril (PRINIVIL,ZESTRIL) 10 MG tablet Take 1 tablet (10 mg total) by mouth daily. Restart on 06/06/18  . metFORMIN (GLUCOPHAGE) 500 MG tablet TAKE 1 TABLET BY MOUTH TWICE DAILY WITH A MEAL  . Omega-3 Fatty Acids (FISH OIL) 1200 MG CAPS Take 2,400 mg by mouth daily.  . [DISCONTINUED] metFORMIN (GLUCOPHAGE) 500 MG tablet TAKE 1 TABLET BY MOUTH TWICE DAILY WITH A MEAL  . [DISCONTINUED] TRESIBA FLEXTOUCH 100 UNIT/ML SOPN FlexTouch Pen INJECT 50 UNITS INTO THE SKIN AT BEDTIME.   No facility-administered encounter medications on file as of 11/26/2018.    ALLERGIES: Allergies  Allergen Reactions  . Augmentin [Amoxicillin-Pot Clavulanate] Rash   VACCINATION STATUS:  There is no immunization history on file for this patient.  Diabetes He presents for his follow-up diabetic visit. He has type 2 diabetes mellitus. Onset time: He was diagnosed at approximate age of 68 years. His disease course has been stable. There are no hypoglycemic associated symptoms. Pertinent negatives for hypoglycemia include no confusion, pallor or seizures. Pertinent negatives for diabetes include no fatigue, no polydipsia, no polyphagia, no polyuria and no weakness. There are no hypoglycemic complications. Symptoms are stable. There are no diabetic complications. Risk factors  for coronary artery disease include diabetes mellitus, dyslipidemia, hypertension, male sex, obesity, tobacco exposure and sedentary lifestyle. Current diabetic treatment includes oral agent (dual therapy). He is compliant with treatment most of the time. He is following a generally unhealthy diet. When asked about meal planning, he reported none. He has not had a previous visit with a dietitian. He never participates in exercise. His breakfast blood glucose range is generally 130-140 mg/dl. His overall blood glucose range is 130-140 mg/dl. An ACE inhibitor/angiotensin II receptor blocker is being taken. Eye exam is current.  Hyperlipidemia This is a chronic problem. The current episode started more than 1 year ago. The problem is uncontrolled. Exacerbating diseases include diabetes and obesity. Pertinent negatives include no  myalgias. Current antihyperlipidemic treatment includes statins. Risk factors for coronary artery disease include diabetes mellitus, dyslipidemia, hypertension, male sex, obesity and a sedentary lifestyle.     Objective:    There were no vitals taken for this visit.  Wt Readings from Last 3 Encounters:  06/03/18 233 lb 0.4 oz (105.7 kg)  03/12/18 234 lb (106.1 kg)  11/02/17 230 lb (104.3 kg)    CMP  Lipid Panel     Component Value Date/Time   CHOL 197 06/03/2018 0930   TRIG 271 (H) 06/03/2018 0930   HDL 24 (L) 06/03/2018 0930   CHOLHDL 8.2 06/03/2018 0930   VLDL 54 (H) 06/03/2018 0930   LDLCALC 119 (H) 06/03/2018 0930   LDLCALC  02/27/2018 0709     Comment:     . LDL cholesterol not calculated. Triglyceride levels greater than 400 mg/dL invalidate calculated LDL results. . Reference range: <100 . Desirable range <100 mg/dL for primary prevention;   <70 mg/dL for patients with CHD or diabetic patients  with > or = 2 CHD risk factors. [name]. LDL-C is now calculated using the Martin-Hopkins  calculation, which is a validated novel method providing  better accuracy  than the Friedewald equation in the  estimation of LDL-C.  Horald PollenMartin SS et al. Lenox AhrJAMA. 1610;960(452013;310(19): 2061-2068  (http://education.[facility].com/faq/FAQ164)    Recent Results (from the past 2160 hour(s))  Hemoglobin A1c     Status: Abnormal   Collection Time: 11/14/18  7:13 AM  Result Value Ref Range   Hgb A1c MFr Bld 8.3 (H) <5.7 % of total Hgb    Comment: For someone without known diabetes, a hemoglobin A1c value of 6.5% or greater indicates that they may have  diabetes and this should be confirmed with a follow-up  test. . For someone with known diabetes, a value <7% indicates  that their diabetes is well controlled and a value  greater than or equal to 7% indicates suboptimal  control. A1c targets should be individualized based on  duration of diabetes, age, comorbid conditions, and  other considerations. . Currently, no consensus exists regarding use of hemoglobin A1c for diagnosis of diabetes for children. .    Mean Plasma Glucose 192 (calc)   eAG (mmol/L) 10.6 (calc)  COMPLETE METABOLIC PANEL WITH GFR     Status: Abnormal   Collection Time: 11/14/18  7:13 AM  Result Value Ref Range   Glucose, Bld 146 (H) 65 - 99 mg/dL    Comment: .            Fasting reference interval . For someone without known diabetes, a glucose value >125 mg/dL indicates that they may have diabetes and this should be confirmed with a follow-up test. .    BUN 21 7 - 25 mg/dL   Creat 4.091.17 8.110.70 - 9.141.25 mg/dL    Comment: For patients >68 years of age, the reference limit for Creatinine is approximately 13% higher for people identified as African-American. .    GFR, Est Non African American 64 > OR = 60 mL/min/1.2273m2   GFR, Est African American 74 > OR = 60 mL/min/1.4673m2   BUN/Creatinine Ratio NOT APPLICABLE 6 - 22 (calc)   Sodium 139 135 - 146 mmol/L   Potassium 4.9 3.5 - 5.3 mmol/L   Chloride 103 98 - 110 mmol/L   CO2 29 20 - 32 mmol/L   Calcium 9.9 8.6 - 10.3 mg/dL   Total Protein  7.2 6.1 - 8.1 g/dL   Albumin 4.1 3.6 - 5.1 g/dL  Globulin 3.1 1.9 - 3.7 g/dL (calc)   AG Ratio 1.3 1.0 - 2.5 (calc)   Total Bilirubin 0.7 0.2 - 1.2 mg/dL   Alkaline phosphatase (APISO) 33 (L) 35 - 144 U/L   AST 33 10 - 35 U/L   ALT 32 9 - 46 U/L      Assessment & Plan:   1. Uncontrolled type 2 diabetes mellitus with CKD - Patient has currently uncontrolled symptomatic type 2 DM since  68 years of age. -  His previsit labs show A1c of 8.3%, staying unchanged at 8.5% during his last visit.    - His diabetes is complicated by worsening CKD, obesity/sedentary life and patient remains at a high risk for more acute and chronic complications of diabetes which include CAD, CVA, CKD, retinopathy, and neuropathy. These are all discussed in detail with the patient.  - I have counseled the patient on diet management and weight loss, by adopting a carbohydrate restricted/protein rich diet. -He admits to dietary discretions including consumption of butter and sweets. - he  admits there is a room for improvement in his diet and drink choices. -  Suggestion is made for him to avoid simple carbohydrates  from his diet including Cakes, Sweet Desserts / Pastries, Ice Cream, Soda (diet and regular), Sweet Tea, Candies, Chips, Cookies, Sweet Pastries,  Store Bought Juices, Alcohol in Excess of  1-2 drinks a day, Artificial Sweeteners, Coffee Creamer, and "Sugar-free" Products. This will help patient to have stable blood glucose profile and potentially avoid unintended weight gain.   - Patient is advised to stick to a routine mealtimes to eat 3 meals  a day and avoid unnecessary snacks ( to snack only to correct hypoglycemia).   - I have approached patient with the following individualized plan to manage diabetes and patient agrees:   -Based on his blood glucose profile, he would not tolerate any higher dose of Tresiba.  -He is advised to continue Tresiba 60 units nightly,  associated with strict  monitoring of blood glucose 2 times a day-before breakfast and at bedtime.   -His renal function is back to normal, advised to continue low-dose metformin at 500 mg p.o. twice daily.   -I discussed and added glipizide 5 mg XL p.o. daily at breakfast.  2) Lipids/HPL: His previsit fasting lipid panel showed hypertriglyceridemia 582 increasing from 189.  He was taken off of statins due to transaminitis.  He is liver enzymes are back to normal now.  He will benefit from a higher dose of omega-3 fatty acids.  He is advised to continue omega-3 fatty acids to 2 g p.o. twice daily along with his fenofibrate.   3) hypertension: He has well-controlled pressure.  He is advised to continue lisinopril 10 mg p.o. daily.  4) vitamin D deficiency -He is on ongoing supplement with vitamin D3, ongoing supplement with vitamin D3, daily.    - Patient Care Time Today:  25 min, of which >50% was spent in  counseling and the rest reviewing his  current and  previous labs/studies, previous treatments, his blood glucose readings, and medications' doses and developing a plan for long-term care based on the latest recommendations for standards of care.   Candis SchatzWilliam Moosman participated in the discussions, expressed understanding, and voiced agreement with the above plans.  All questions were answered to his satisfaction. he is encouraged to contact clinic should he have any questions or concerns prior to his return visit.   Follow up plan: - Return in about  4 months (around 03/28/2019) for Bring Meter and Logs- A1c in Office.  Marquis Lunch, MD Phone: [phone]  Fax: [phone]  This note was partially dictated with voice recognition software. Similar sounding words can be transcribed inadequately or may not  be corrected upon review.  11/26/2018, 4:59 PM

## 2018-11-30 ENCOUNTER — Other Ambulatory Visit: Payer: Self-pay | Admitting: "Endocrinology

## 2018-11-30 DIAGNOSIS — E782 Mixed hyperlipidemia: Secondary | ICD-10-CM

## 2019-01-03 ENCOUNTER — Telehealth: Payer: Self-pay | Admitting: "Endocrinology

## 2019-01-03 ENCOUNTER — Other Ambulatory Visit: Payer: Self-pay | Admitting: "Endocrinology

## 2019-01-03 MED ORDER — LISINOPRIL 10 MG PO TABS: 10.0000 mg | ORAL_TABLET | Freq: Every day | ORAL | 3 refills | Status: DC

## 2019-01-03 NOTE — Telephone Encounter (Signed)
Pt's wife is calling for a refill on lisinopril (PRINIVIL,ZESTRIL) 10 MG tablet. She said that Dr Dorris Fetch has written this RX before for him. Please advise

## 2019-01-03 NOTE — Telephone Encounter (Signed)
Noted  

## 2019-01-03 NOTE — Telephone Encounter (Signed)
Dr Dorris Fetch have you been refilling the Lisinopril for Mr Frenette?

## 2019-01-03 NOTE — Telephone Encounter (Signed)
Yes , we can help with that. I will take care of it.

## 2019-03-26 ENCOUNTER — Other Ambulatory Visit: Payer: Self-pay | Admitting: "Endocrinology

## 2019-03-26 DIAGNOSIS — E782 Mixed hyperlipidemia: Secondary | ICD-10-CM

## 2019-03-28 ENCOUNTER — Ambulatory Visit: Payer: PPO | Admitting: "Endocrinology

## 2019-04-17 ENCOUNTER — Ambulatory Visit (INDEPENDENT_AMBULATORY_CARE_PROVIDER_SITE_OTHER): Payer: PPO | Admitting: "Endocrinology

## 2019-04-17 ENCOUNTER — Encounter: Payer: Self-pay | Admitting: "Endocrinology

## 2019-04-17 ENCOUNTER — Other Ambulatory Visit: Payer: Self-pay

## 2019-04-17 VITALS — BP 157/82 | HR 69 | Ht 71.0 in | Wt 238.0 lb

## 2019-04-17 DIAGNOSIS — E1122 Type 2 diabetes mellitus with diabetic chronic kidney disease: Secondary | ICD-10-CM | POA: Diagnosis not present

## 2019-04-17 DIAGNOSIS — N183 Chronic kidney disease, stage 3 unspecified: Secondary | ICD-10-CM | POA: Diagnosis not present

## 2019-04-17 DIAGNOSIS — I10 Essential (primary) hypertension: Secondary | ICD-10-CM

## 2019-04-17 DIAGNOSIS — E782 Mixed hyperlipidemia: Secondary | ICD-10-CM

## 2019-04-17 DIAGNOSIS — Z794 Long term (current) use of insulin: Secondary | ICD-10-CM

## 2019-04-17 LAB — POCT GLYCOSYLATED HEMOGLOBIN (HGB A1C): Hemoglobin A1C: 6.3 % — AB (ref 4.0–5.6)

## 2019-04-17 MED ORDER — TRESIBA FLEXTOUCH 100 UNIT/ML ~~LOC~~ SOPN: 50.0000 [IU] | PEN_INJECTOR | Freq: Every day | SUBCUTANEOUS | 3 refills | Status: DC

## 2019-04-17 NOTE — Patient Instructions (Signed)

## 2019-04-17 NOTE — Progress Notes (Signed)
04/17/2019                                                    Endocrinology Telehealth Visit Follow up Note -During COVID -19 Pandemic  This visit type was conducted due to national recommendations for restrictions regarding the COVID-19 Pandemic  in an effort to limit this patient's exposure and mitigate transmission of the corona virus.  Due to his co-morbid illnesses, Javier Walker is at  moderate to high risk for complications without adequate follow up.  This format is felt to be most appropriate for him at this time.  I connected with this patient on 04/17/2019   by telephone and verified that I am speaking with the correct person using two identifiers. Javier Walker, July 30, 1950. he has verbally consented to this visit. All issues noted in this document were discussed and addressed. The format was not optimal for physical exam.    Subjective:    Patient ID: Javier Walker, male    DOB: [birth year]. Patient is being engaged in telehealth via telephone for follow-up of management of diabetes, Hypertension, hyperlipidemia.   Past Medical History:  Diagnosis Date  . Diabetes mellitus without complication (HCC)   . GERD (gastroesophageal reflux disease)    occ-no meds  . Hives    patient has hives with anxiety  . Hyperlipemia   . Hypertension   . Shortness of breath    Past Surgical History:  Procedure Laterality Date  . CHOLECYSTECTOMY N/A 08/26/2016   Procedure: CHOLECYSTECTOMY;  Surgeon: Franky Macho, MD;  Location: [facility];  Service: General;  Laterality: N/A;  . COLON SURGERY  2011   gangrene lower colon with colostomy  . COLOSTOMY REVERSAL  2011  . ERCP N/A 08/17/2016   Procedure: ENDOSCOPIC RETROGRADE CHOLANGIOPANCREATOGRAPHY (ERCP) Biliary sphincterotomy and stone extraction;  Surgeon: Malissa Hippo, MD;  Location: [facility];  Service: Endoscopy;  Laterality: N/A;  . OPEN REDUCTION INTERNAL FIXATION (ORIF) TIBIA/FIBULA FRACTURE Right 08/30/2012   Procedure: OPEN  REDUCTION INTERNAL FIXATION (ORIF) TIBIA/FIBULA FRACTURE;  Surgeon: Toni Arthurs, MD;  Location: [facility];  Service: Orthopedics;  Laterality: Right;  . WOUND DEBRIDEMENT  2011   abd-   Social History   Socioeconomic History  . Marital status: Married    Spouse name: Not on file  . Number of children: Not on file  . Years of education: Not on file  . Highest education level: Not on file  Occupational History  . Not on file  Tobacco Use  . Smoking status: Former Smoker    Packs/day: 2.00    Years: 15.00    Pack years: 30.00    Types: Cigarettes    Quit date: 08/27/1988    Years since quitting: 30.6  . Smokeless tobacco: Never Used  Substance and Sexual Activity  . Alcohol use: Yes    Comment: rarely  . Drug use: No  . Sexual activity: Yes    Birth control/protection: None  Other Topics Concern  . Not on file  Social History Narrative  . Not on file   Social Determinants of Health   Financial Resource Strain: Unknown  . Difficulty of Paying Living Expenses: Patient refused  Food Insecurity: Unknown  . Worried About Programme researcher, broadcasting/film/video in the Last Year: Patient refused  . Ran Out of Food in the Last  Year: Patient refused  Transportation Needs: Unknown  . Lack of Transportation (Medical): Patient refused  . Lack of Transportation (Non-Medical): Patient refused  Physical Activity: Unknown  . Days of Exercise per Week: Patient refused  . Minutes of Exercise per Session: Patient refused  Stress: Unknown  . Feeling of Stress : Patient refused  Social Connections: Unknown  . Frequency of Communication with Friends and Family: Patient refused  . Frequency of Social Gatherings with Friends and Family: Patient refused  . Attends Religious Services: Patient refused  . Active Member of Clubs or Organizations: Patient refused  . Attends BankerClub or Organization Meetings: Patient refused  . Marital Status: Patient refused   Outpatient Encounter Medications as of  04/17/2019  Medication Sig  . acetaminophen (TYLENOL) 325 MG tablet Take 2 tablets (650 mg total) by mouth every 6 (six) hours as needed for mild pain, fever or headache (or Fever >/= 101).  [name]. aspirin EC 81 MG tablet Take 1 tablet (81 mg total) by mouth daily with breakfast. Take with Plavix 75 mg daily until seen by neurologist  . atorvastatin (LIPITOR) 40 MG tablet Take 1 tablet (40 mg total) by mouth every evening.  . Cholecalciferol (VITAMIN D3) 125 MCG (5000 UT) CAPS Take 1 capsule (5,000 Units total) by mouth daily.  . clopidogrel (PLAVIX) 75 MG tablet Take 1 tablet (75 mg total) by mouth daily. Take with aspirin 81 mg daily until seen by neurologist  . fenofibrate (TRICOR) 145 MG tablet Take 1 tablet by mouth once daily  . glipiZIDE (GLUCOTROL XL) 5 MG 24 hr tablet Take 1 tablet by mouth once daily with breakfast  . insulin degludec (TRESIBA FLEXTOUCH) 100 UNIT/ML SOPN FlexTouch Pen Inject 0.5 mLs (50 Units total) into the skin at bedtime. INJECT 60 UNITS INTO THE SKIN AT BEDTIME.  [name]. lisinopril (ZESTRIL) 10 MG tablet TAKE 1 TABLET BY MOUTH ONCE DAILY **RESTART  ON  06/06/2018**  . metFORMIN (GLUCOPHAGE) 500 MG tablet TAKE 1 TABLET BY MOUTH TWICE DAILY WITH A MEAL  . Omega-3 Fatty Acids (FISH OIL) 1200 MG CAPS Take 2,400 mg by mouth daily.  . [DISCONTINUED] insulin degludec (TRESIBA FLEXTOUCH) 100 UNIT/ML SOPN FlexTouch Pen INJECT 60 UNITS INTO THE SKIN AT BEDTIME.   No facility-administered encounter medications on file as of 04/17/2019.   ALLERGIES: Allergies  Allergen Reactions  . Augmentin [Amoxicillin-Pot Clavulanate] Rash   VACCINATION STATUS:  There is no immunization history on file for this patient.  Diabetes He presents for his follow-up diabetic visit. He has type 2 diabetes mellitus. Onset time: He was diagnosed at approximate age of 69 years. His disease course has been improving. There are no hypoglycemic associated symptoms. Pertinent negatives for hypoglycemia include no  confusion, pallor or seizures. Pertinent negatives for diabetes include no fatigue, no polydipsia, no polyphagia, no polyuria and no weakness. There are no hypoglycemic complications. Symptoms are improving. There are no diabetic complications. Risk factors for coronary artery disease include diabetes mellitus, dyslipidemia, hypertension, male sex, obesity, tobacco exposure and sedentary lifestyle. Current diabetic treatment includes oral agent (dual therapy). He is compliant with treatment most of the time. His weight is increasing steadily. He is following a generally unhealthy diet. When asked about meal planning, he reported none. He has not had a previous visit with a dietitian. He never participates in exercise. His home blood glucose trend is decreasing steadily. His breakfast blood glucose range is generally 90-110 mg/dl. His overall blood glucose range is 130-140 mg/dl. (He presents with tightly  controlled fasting glycemic profile and point-of-care A1c was 6.3%, improving from 8.3% during his last visit.) An ACE inhibitor/angiotensin II receptor blocker is being taken. Eye exam is current.  Hyperlipidemia This is a chronic problem. The current episode started more than 1 year ago. The problem is uncontrolled. Exacerbating diseases include diabetes and obesity. Pertinent negatives include no myalgias. Current antihyperlipidemic treatment includes statins. Risk factors for coronary artery disease include diabetes mellitus, dyslipidemia, hypertension, male sex, obesity and a sedentary lifestyle.    Constitutional: + weight gain, no fatigue, no subjective hyperthermia, no subjective hypothermia Eyes: no blurry vision, no xerophthalmia ENT: no sore throat, no nodules palpated in throat, no dysphagia/odynophagia, no hoarseness Cardiovascular: no Chest Pain, no Shortness of Breath, no palpitations, no leg swelling Respiratory: no cough, no SOB Gastrointestinal: no  Nausea/Vomiting/Diarhhea Musculoskeletal: no muscle/joint aches Skin: no rashes Neurological: no tremors, no numbness, no tingling, no dizziness Psychiatric: no depression, no anxiety  Objective:    BP (!) 157/82   Pulse 69   Ht 5\' 11"  (1.803 m)   Wt 238 lb (108 kg)   BMI 33.19 kg/m   Wt Readings from Last 3 Encounters:  04/17/19 238 lb (108 kg)  06/03/18 233 lb 0.4 oz (105.7 kg)  03/12/18 234 lb (106.1 kg)     Physical Exam- Limited  Constitutional:  Body mass index is 33.19 kg/m. , not in acute distress, normal state of mind Eyes:  EOMI, no exophthalmos Neck: Supple Respiratory: Adequate breathing efforts Musculoskeletal: no gross deformities, strength intact in all four extremities, no gross restriction of joint movements Skin:  no rashes, no hyperemia Neurological: no tremor with outstretched hands.  Lipid Panel     Component Value Date/Time   CHOL 197 06/03/2018 0930   TRIG 271 (H) 06/03/2018 0930   HDL 24 (L) 06/03/2018 0930   CHOLHDL 8.2 06/03/2018 0930   VLDL 54 (H) 06/03/2018 0930   LDLCALC 119 (H) 06/03/2018 0930   LDLCALC  02/27/2018 0709     Comment:     . LDL cholesterol not calculated. Triglyceride levels greater than 400 mg/dL invalidate calculated LDL results. . Reference range: <100 . Desirable range <100 mg/dL for primary prevention;   <70 mg/dL for patients with CHD or diabetic patients  with > or = 2 CHD risk factors. 03/01/2018 LDL-C is now calculated using the Martin-Hopkins  calculation, which is a validated novel method providing  better accuracy than the Friedewald equation in the  estimation of LDL-C.  [name] et al. Horald Pollen. Lenox Ahr): 2061-2068  (http://education.[facility].com/faq/FAQ164)    Recent Results (from the past 2160 hour(s))  HgB A1c     Status: Abnormal   Collection Time: 04/17/19  2:22 PM  Result Value Ref Range   Hemoglobin A1C 6.3 (A) 4.0 - 5.6 %   HbA1c POC (<> result, manual entry)     HbA1c, POC  (prediabetic range)     HbA1c, POC (controlled diabetic range)        Assessment & Plan:   1. Uncontrolled type 2 diabetes mellitus with CKD - Patient has currently uncontrolled symptomatic type 2 DM since  69 years of age. -He presents with tightly controlled fasting glycemic profile and A1c at point-of-care was 6.3% improving from 8.3%.     - His diabetes is complicated by worsening CKD, obesity/sedentary life and patient remains at a high risk for more acute and chronic complications of diabetes which include CAD, CVA, CKD, retinopathy, and neuropathy. These are all discussed in detail with the  patient.  - I have counseled the patient on diet management and weight loss, by adopting a carbohydrate restricted/protein rich diet.  - he  admits there is a room for improvement in his diet and drink choices. -  Suggestion is made for him to avoid simple carbohydrates  from his diet including Cakes, Sweet Desserts / Pastries, Ice Cream, Soda (diet and regular), Sweet Tea, Candies, Chips, Cookies, Sweet Pastries,  Store Bought Juices, Alcohol in Excess of  1-2 drinks a day, Artificial Sweeteners, Coffee Creamer, and "Sugar-free" Products. This will help patient to have stable blood glucose profile and potentially avoid unintended weight gain.   - Patient is advised to stick to a routine mealtimes to eat 3 meals  a day and avoid unnecessary snacks ( to snack only to correct hypoglycemia).   - I have approached patient with the following individualized plan to manage diabetes and patient agrees:   -Based on his presentation with tightly controlled fasting glycemic profile, his basal insulin will be lowered.   -He is advised to lower his [place] to 50 units nightly, agrees to continue monitoring blood glucose twice a day-daily before breakfast and at bedtime as needed.    -His most recent renal function test was normal, will continue to benefit from low-dose metformin.  He is advised to continue   metformin at 500 mg p.o. twice daily.   -He has benefited from low-dose glipizide, advised to continue glipizide 5 mg XL p.o. daily at breakfast.  2) Lipids/HPL: His previsit fasting lipid panel showed hypertriglyceridemia 582 increasing from 189.  He was taken off of statins due to transaminitis.  He is liver enzymes are back to normal now.  He will benefit from a higher dose of omega-3 fatty acids.  He is advised to continue omega-3 fatty acids to 2 g p.o. twice daily along with his fenofibrate.   3) hypertension: His blood pressure is not controlled to target.  He is encouraged to continue his medications including lisinopril 10 mg p.o. daily.  He will be considered for HCTZ next visit if blood pressure remains above 130/80.    4) vitamin D deficiency -He is on ongoing supplement with vitamin D3, ongoing supplement with vitamin D3, daily.  He is advised to continue with vitamin D3 supplement 5000 units daily.   - Time spent on this patient care encounter:  35 min , of which  50% was spent in  counseling and the rest reviewing his blood glucose logs , discussing his hypoglycemia and hyperglycemia episodes, reviewing his current and  previous labs / studies  ( including abstraction from other facilities) and medications  doses and developing a  long term treatment plan and documenting his care.   Please refer to Patient Instructions for Blood Glucose Monitoring and Insulin/Medications Dosing Guide"  in media tab for additional information. Please  also refer to " Patient Self Inventory" in the Media  tab for reviewed elements of pertinent patient history.  Javier Walker participated in the discussions, expressed understanding, and voiced agreement with the above plans.  All questions were answered to his satisfaction. he is encouraged to contact clinic should he have any questions or concerns prior to his return visit.    Follow up plan: - Return in about 4 months (around 08/15/2019) for Follow  up with Pre-visit Labs, Next Visit A1c in Office.  Marquis Lunch, MD Phone: [phone]  Fax: [phone]  This note was partially dictated with voice recognition software. Similar sounding words can be  transcribed inadequately or may not  be corrected upon review.  04/17/2019, 3:29 PM

## 2019-04-18 ENCOUNTER — Other Ambulatory Visit: Payer: Self-pay

## 2019-04-18 DIAGNOSIS — E1122 Type 2 diabetes mellitus with diabetic chronic kidney disease: Secondary | ICD-10-CM

## 2019-04-18 DIAGNOSIS — N183 Chronic kidney disease, stage 3 unspecified: Secondary | ICD-10-CM

## 2019-04-18 MED ORDER — TRESIBA FLEXTOUCH 100 UNIT/ML ~~LOC~~ SOPN: 50.0000 [IU] | PEN_INJECTOR | Freq: Every day | SUBCUTANEOUS | 3 refills | Status: DC

## 2019-04-22 ENCOUNTER — Telehealth: Payer: Self-pay | Admitting: "Endocrinology

## 2019-04-22 NOTE — Telephone Encounter (Signed)
[facility] left a VM that they have two sets of directions for his [place]. Can you call them to clarify

## 2019-04-22 NOTE — Telephone Encounter (Signed)
Called [facility] and clarified tresiba dose as 50u qhs.

## 2019-06-13 ENCOUNTER — Other Ambulatory Visit: Payer: Self-pay | Admitting: "Endocrinology

## 2019-07-23 ENCOUNTER — Other Ambulatory Visit: Payer: Self-pay | Admitting: "Endocrinology

## 2019-07-23 DIAGNOSIS — E1122 Type 2 diabetes mellitus with diabetic chronic kidney disease: Secondary | ICD-10-CM

## 2019-07-24 ENCOUNTER — Other Ambulatory Visit: Payer: Self-pay | Admitting: "Endocrinology

## 2019-07-24 DIAGNOSIS — E782 Mixed hyperlipidemia: Secondary | ICD-10-CM

## 2019-07-29 DIAGNOSIS — Z794 Long term (current) use of insulin: Secondary | ICD-10-CM | POA: Diagnosis not present

## 2019-07-29 DIAGNOSIS — N183 Chronic kidney disease, stage 3 unspecified: Secondary | ICD-10-CM | POA: Diagnosis not present

## 2019-07-29 DIAGNOSIS — E1122 Type 2 diabetes mellitus with diabetic chronic kidney disease: Secondary | ICD-10-CM | POA: Diagnosis not present

## 2019-07-30 LAB — COMPLETE METABOLIC PANEL WITH GFR
AG Ratio: 1.3 (calc) (ref 1.0–2.5)
ALT: 31 U/L (ref 9–46)
AST: 30 U/L (ref 10–35)
Albumin: 3.8 g/dL (ref 3.6–5.1)
Alkaline phosphatase (APISO): 31 U/L — ABNORMAL LOW (ref 35–144)
BUN/Creatinine Ratio: 13 (calc) (ref 6–22)
BUN: 17 mg/dL (ref 7–25)
CO2: 26 mmol/L (ref 20–32)
Calcium: 9.8 mg/dL (ref 8.6–10.3)
Chloride: 103 mmol/L (ref 98–110)
Creat: 1.27 mg/dL — ABNORMAL HIGH (ref 0.70–1.25)
GFR, Est African American: 67 mL/min/{1.73_m2} (ref >=60)
GFR, Est Non African American: 58 mL/min/{1.73_m2} — ABNORMAL LOW (ref >=60)
Globulin: 3 g/dL (calc) (ref 1.9–3.7)
Glucose, Bld: 124 mg/dL — ABNORMAL HIGH (ref 65–99)
Potassium: 4.5 mmol/L (ref 3.5–5.3)
Sodium: 137 mmol/L (ref 135–146)
Total Bilirubin: 0.7 mg/dL (ref 0.2–1.2)
Total Protein: 6.8 g/dL (ref 6.1–8.1)

## 2019-07-30 LAB — LIPID PANEL
Cholesterol: 186 mg/dL (ref <200)
HDL: 25 mg/dL — ABNORMAL LOW (ref >=40)
LDL Cholesterol (Calc): 119 mg/dL (calc) — ABNORMAL HIGH
Non-HDL Cholesterol (Calc): 161 mg/dL (calc) — ABNORMAL HIGH (ref <130)
Total CHOL/HDL Ratio: 7.4 (calc) — ABNORMAL HIGH (ref <5.0)
Triglycerides: 270 mg/dL — ABNORMAL HIGH (ref <150)

## 2019-07-30 LAB — T4, FREE: Free T4: 1.3 ng/dL (ref 0.8–1.8)

## 2019-07-30 LAB — TSH: TSH: 2.47 mIU/L (ref 0.40–4.50)

## 2019-07-30 LAB — VITAMIN D 25 HYDROXY (VIT D DEFICIENCY, FRACTURES): Vit D, 25-Hydroxy: 47 ng/mL (ref 30–100)

## 2019-08-02 DIAGNOSIS — Z794 Long term (current) use of insulin: Secondary | ICD-10-CM | POA: Diagnosis not present

## 2019-08-02 DIAGNOSIS — E1122 Type 2 diabetes mellitus with diabetic chronic kidney disease: Secondary | ICD-10-CM | POA: Diagnosis not present

## 2019-08-02 DIAGNOSIS — N183 Chronic kidney disease, stage 3 unspecified: Secondary | ICD-10-CM | POA: Diagnosis not present

## 2019-08-03 LAB — MICROALBUMIN / CREATININE URINE RATIO
Creatinine, Urine: 110 mg/dL (ref 20–320)
Microalb Creat Ratio: 5 mcg/mg creat (ref <30)
Microalb, Ur: 0.5 mg/dL

## 2019-08-15 ENCOUNTER — Ambulatory Visit (INDEPENDENT_AMBULATORY_CARE_PROVIDER_SITE_OTHER): Payer: PPO | Admitting: "Endocrinology

## 2019-08-15 ENCOUNTER — Encounter: Payer: Self-pay | Admitting: "Endocrinology

## 2019-08-15 ENCOUNTER — Other Ambulatory Visit: Payer: Self-pay

## 2019-08-15 VITALS — BP 166/78 | HR 68 | Ht 71.0 in | Wt 237.2 lb

## 2019-08-15 DIAGNOSIS — N183 Chronic kidney disease, stage 3 unspecified: Secondary | ICD-10-CM

## 2019-08-15 DIAGNOSIS — Z794 Long term (current) use of insulin: Secondary | ICD-10-CM

## 2019-08-15 DIAGNOSIS — E1122 Type 2 diabetes mellitus with diabetic chronic kidney disease: Secondary | ICD-10-CM | POA: Diagnosis not present

## 2019-08-15 DIAGNOSIS — E782 Mixed hyperlipidemia: Secondary | ICD-10-CM | POA: Diagnosis not present

## 2019-08-15 DIAGNOSIS — I10 Essential (primary) hypertension: Secondary | ICD-10-CM | POA: Diagnosis not present

## 2019-08-15 LAB — POCT GLYCOSYLATED HEMOGLOBIN (HGB A1C): Hemoglobin A1C: 6.8 % — AB (ref 4.0–5.6)

## 2019-08-15 MED ORDER — LISINOPRIL 20 MG PO TABS: 20.0000 mg | ORAL_TABLET | Freq: Every day | ORAL | 1 refills | Status: DC

## 2019-08-15 NOTE — Progress Notes (Signed)
08/15/2019  08/15/2019  Endocrinology follow-up note   Subjective:    Patient ID: Javier Walker, male    DOB: 1951-02-23.  He is being seen in follow-up  of management of diabetes, Hypertension, hyperlipidemia.   Past Medical History:  Diagnosis Date  . Diabetes mellitus without complication (HCC)   . GERD (gastroesophageal reflux disease)    occ-no meds  . Hives    patient has hives with anxiety  . Hyperlipemia   . Hypertension   . Shortness of breath    Past Surgical History:  Procedure Laterality Date  . CHOLECYSTECTOMY N/A 08/26/2016   Procedure: CHOLECYSTECTOMY;  Surgeon: Franky Macho, MD;  Location: [facility];  Service: General;  Laterality: N/A;  . COLON SURGERY  2011   gangrene lower colon with colostomy  . COLOSTOMY REVERSAL  2011  . ERCP N/A 08/17/2016   Procedure: ENDOSCOPIC RETROGRADE CHOLANGIOPANCREATOGRAPHY (ERCP) Biliary sphincterotomy and stone extraction;  Surgeon: Malissa Hippo, MD;  Location: [facility];  Service: Endoscopy;  Laterality: N/A;  . OPEN REDUCTION INTERNAL FIXATION (ORIF) TIBIA/FIBULA FRACTURE Right 08/30/2012   Procedure: OPEN REDUCTION INTERNAL FIXATION (ORIF) TIBIA/FIBULA FRACTURE;  Surgeon: Toni Arthurs, MD;  Location: [facility];  Service: Orthopedics;  Laterality: Right;  . WOUND DEBRIDEMENT  2011   abd-   Social History   Socioeconomic History  . Marital status: Married    Spouse name: Not on file  . Number of children: Not on file  . Years of education: Not on file  . Highest education level: Not on file  Occupational History  . Not on file  Tobacco Use  . Smoking status: Former Smoker    Packs/day: 2.00    Years: 15.00    Pack years: 30.00    Types: Cigarettes    Quit date: 08/27/1988    Years since quitting: 30.9  . Smokeless tobacco: Never Used  Substance and Sexual Activity  . Alcohol use: Yes    Comment: rarely  . Drug use: No  . Sexual activity: Yes    Birth control/protection: None  Other Topics  Concern  . Not on file  Social History Narrative  . Not on file   Social Determinants of Health   Financial Resource Strain:   . Difficulty of Paying Living Expenses:   Food Insecurity:   . Worried About Programme researcher, broadcasting/film/video in the Last Year:   . Barista in the Last Year:   Transportation Needs:   . Freight forwarder (Medical):   [name] Lack of Transportation (Non-Medical):   Physical Activity:   . Days of Exercise per Week:   . Minutes of Exercise per Session:   Stress:   . Feeling of Stress :   Social Connections:   . Frequency of Communication with Friends and Family:   . Frequency of Social Gatherings with Friends and Family:   . Attends Religious Services:   . Active Member of Clubs or Organizations:   . Attends Banker Meetings:   [name] Marital Status:    Outpatient Encounter Medications as of 08/15/2019  Medication Sig  . acetaminophen (TYLENOL) 325 MG tablet Take 2 tablets (650 mg total) by mouth every 6 (six) hours as needed for mild pain, fever or headache (or Fever >/= 101).  [name] atorvastatin (LIPITOR) 40 MG tablet Take 1 tablet (40 mg total) by mouth every evening.  . Cholecalciferol (VITAMIN D3) 125 MCG (5000 UT) CAPS Take 1 capsule (5,000 Units total) by mouth  daily.  . clopidogrel (PLAVIX) 75 MG tablet Take 1 tablet (75 mg total) by mouth daily. Take with aspirin 81 mg daily until seen by neurologist  . fenofibrate (TRICOR) 145 MG tablet Take 1 tablet by mouth once daily  . glipiZIDE (GLUCOTROL XL) 5 MG 24 hr tablet Take 1 tablet by mouth once daily with breakfast  . insulin degludec (TRESIBA FLEXTOUCH) 100 UNIT/ML SOPN FlexTouch Pen Inject 0.5 mLs (50 Units total) into the skin at bedtime.  [name] lisinopril (ZESTRIL) 10 MG tablet TAKE 1 TABLET BY MOUTH ONCE DAILY **RESTART  ON  06/06/2018**  . metFORMIN (GLUCOPHAGE) 500 MG tablet TAKE 1 TABLET BY MOUTH TWICE DAILY WITH A MEAL  . Omega-3 Fatty Acids (FISH OIL) 1200 MG CAPS Take 2,400 mg by mouth daily.    No facility-administered encounter medications on file as of 08/15/2019.   ALLERGIES: Allergies  Allergen Reactions  . Augmentin [Amoxicillin-Pot Clavulanate] Rash   VACCINATION STATUS:  There is no immunization history on file for this patient.  Diabetes He presents for his follow-up diabetic visit. He has type 2 diabetes mellitus. Onset time: He was diagnosed at approximate age of 4 years. His disease course has been stable. There are no hypoglycemic associated symptoms. Pertinent negatives for hypoglycemia include no confusion, pallor or seizures. Pertinent negatives for diabetes include no fatigue, no polydipsia, no polyphagia, no polyuria and no weakness. There are no hypoglycemic complications. Symptoms are stable. There are no diabetic complications. Risk factors for coronary artery disease include diabetes mellitus, dyslipidemia, hypertension, male sex, obesity, tobacco exposure and sedentary lifestyle. Current diabetic treatment includes oral agent (dual therapy) and insulin injections (Is currently on basal insulin, Metformin, and low-dose glipizide.). He is compliant with treatment most of the time. His weight is increasing steadily. He is following a generally unhealthy diet. When asked about meal planning, he reported none. He has not had a previous visit with a dietitian. He never participates in exercise. His home blood glucose trend is fluctuating minimally. His breakfast blood glucose range is generally 110-130 mg/dl. His overall blood glucose range is 130-140 mg/dl. (He presents with controlled glycemic profile both fasting and postprandial.  His point-of-care A1c is 6.8%, improving from 8.3%.  ) An ACE inhibitor/angiotensin II receptor blocker is being taken. Eye exam is current.  Hyperlipidemia This is a chronic problem. The current episode started more than 1 year ago. The problem is uncontrolled. Exacerbating diseases include diabetes and obesity. Pertinent negatives include no  myalgias. Current antihyperlipidemic treatment includes statins. Risk factors for coronary artery disease include diabetes mellitus, dyslipidemia, hypertension, male sex, obesity and a sedentary lifestyle.    Review of systems  Constitutional: + Minimally fluctuating body weight,  current  Body mass index is 33.08 kg/m. , no fatigue, no subjective hyperthermia, no subjective hypothermia Eyes: no blurry vision, no xerophthalmia ENT: no sore throat, no nodules palpated in throat, no dysphagia/odynophagia, no hoarseness Cardiovascular: no Chest Pain, no Shortness of Breath, no palpitations, no leg swelling Respiratory: no cough, no shortness of breath Gastrointestinal: no Nausea/Vomiting/Diarhhea Musculoskeletal: no muscle/joint aches Skin: no rashes, no hyperemia Neurological: no tremors, no numbness, no tingling, no dizziness Psychiatric: no depression, no anxiety   Objective:    BP (!) 166/78   Pulse 68   Ht  (1.803 m)   Wt 237 lb 3.2 oz (107.6 kg)   BMI 33.08 kg/m   Wt Readings from Last 3 Encounters:  08/15/19 237 lb 3.2 oz (107.6 kg)  04/17/19 238 lb (108 kg)  06/03/18 233 lb 0.4 oz (105.7 kg)     Physical Exam- Limited  Constitutional:  Body mass index is 33.08 kg/m. , not in acute distress, normal state of mind Eyes:  EOMI, no exophthalmos Neck: Supple Thyroid: No gross goiter Respiratory: Adequate breathing efforts Musculoskeletal: no gross deformities, strength intact in all four extremities, no gross restriction of joint movements Skin:  no rashes, no hyperemia Neurological: no tremor with outstretched hands,    Lipid Panel     Component Value Date/Time   CHOL 186 07/29/2019 0924   TRIG 270 (H) 07/29/2019 0924   HDL 25 (L) 07/29/2019 0924   CHOLHDL 7.4 (H) 07/29/2019 0924   VLDL 54 (H) 06/03/2018 0930   LDLCALC 119 (H) 07/29/2019 0924   Recent Results (from the past 2160 hour(s))  COMPLETE METABOLIC PANEL WITH GFR     Status: Abnormal    Collection Time: 07/29/19  9:24 AM  Result Value Ref Range   Glucose, Bld 124 (H) 65 - 99 mg/dL    Comment: .            Fasting reference interval . For someone without known diabetes, a glucose value between 100 and 125 mg/dL is consistent with prediabetes and should be confirmed with a follow-up test. .    BUN 17 7 - 25 mg/dL   Creat 1.19 (H) 1.47 - 1.25 mg/dL    Comment: For patients >66 years of age, the reference limit for Creatinine is approximately 13% higher for people identified as African-American. .    GFR, Est Non African American 58 (L) > OR = 60 mL/min/1.77m2   GFR, Est African American 67 > OR = 60 mL/min/1.68m2   BUN/Creatinine Ratio 13 6 - 22 (calc)   Sodium 137 135 - 146 mmol/L   Potassium 4.5 3.5 - 5.3 mmol/L   Chloride 103 98 - 110 mmol/L   CO2 26 20 - 32 mmol/L   Calcium 9.8 8.6 - 10.3 mg/dL   Total Protein 6.8 6.1 - 8.1 g/dL   Albumin 3.8 3.6 - 5.1 g/dL   Globulin 3.0 1.9 - 3.7 g/dL (calc)   AG Ratio 1.3 1.0 - 2.5 (calc)   Total Bilirubin 0.7 0.2 - 1.2 mg/dL   Alkaline phosphatase (APISO) 31 (L) 35 - 144 U/L   AST 30 10 - 35 U/L   ALT 31 9 - 46 U/L  T4, free SOLSTAS     Status: None   Collection Time: 07/29/19  9:24 AM  Result Value Ref Range   Free T4 1.3 0.8 - 1.8 ng/dL  TSH SOLSTAS     Status: None   Collection Time: 07/29/19  9:24 AM  Result Value Ref Range   TSH 2.47 0.40 - 4.50 mIU/L  Lipid Panel     Status: Abnormal   Collection Time: 07/29/19  9:24 AM  Result Value Ref Range   Cholesterol 186 <200 mg/dL   HDL 25 (L) > OR = 40 mg/dL   Triglycerides 829 (H) <150 mg/dL    Comment: . If a non-fasting specimen was collected, consider repeat triglyceride testing on a fasting specimen if clinically indicated.  Perry Mount et al. J. of Clin. Lipidol. 2015;9:129-169. [name]    LDL Cholesterol (Calc) 119 (H) mg/dL (calc)    Comment: Reference range: <100 . Desirable range <100 mg/dL for primary prevention;   <70 mg/dL for patients with CHD or  diabetic patients  with > or = 2 CHD risk factors. [name] LDL-C is now calculated using  the Martin-Hopkins  calculation, which is a validated novel method providing  better accuracy than the Friedewald equation in the  estimation of LDL-C.  Horald PollenMartin SS et al. Lenox AhrJAMA. 1610;960(452013;310(19): 2061-2068  (http://education.[facility].com/faq/FAQ164)    Total CHOL/HDL Ratio 7.4 (H) <5.0 (calc)   Non-HDL Cholesterol (Calc) 161 (H) <130 mg/dL (calc)    Comment: For patients with diabetes plus 1 major ASCVD risk  factor, treating to a non-HDL-C goal of <100 mg/dL  (LDL-C of <40<70 mg/dL) is considered a therapeutic  option.   VITAMIN D 25 Hydroxy (Vit-D Deficiency, Fractures)     Status: None   Collection Time: 07/29/19  9:24 AM  Result Value Ref Range   Vit D, 25-Hydroxy 47 30 - 100 ng/mL    Comment: Vitamin D Status         25-OH Vitamin D: . Deficiency:                    <20 ng/mL Insufficiency:             20 - 29 ng/mL Optimal:                 > or = 30 ng/mL . For 25-OH Vitamin D testing on patients on  D2-supplementation and patients for whom quantitation  of D2 and D3 fractions is required, the Quest[facility]ureD(TM) 25-OH VIT D, (D2,D3), LC/MS/MS is recommended: order  code [id number] (patients >2151yrs). See Note 1 . Note 1 . For additional information, please refer to  http://education.[facility].com/faq/FAQ199  (This link is being provided for informational/ educational purposes only.)   Microalbumin/Creatinine Ratio, Urine     Status: None   Collection Time: 08/02/19  7:09 AM  Result Value Ref Range   Creatinine, Urine 110 20 - 320 mg/dL   Microalb, Ur 0.5 mg/dL    Comment: Reference Range Not established    Microalb Creat Ratio 5 <30 mcg/mg creat    Comment: . The ADA defines abnormalities in albumin excretion as follows: [name]. Category         Result (mcg/mg creatinine) . Normal                    <30 Microalbuminuria         30-299  Clinical albuminuria   > OR = 300 . The ADA  recommends that at least two of three specimens collected within a 3-6 month period be abnormal before considering a patient to be within a diagnostic category.   HgB A1c     Status: Abnormal   Collection Time: 08/15/19  8:28 AM  Result Value Ref Range   Hemoglobin A1C 6.8 (A) 4.0 - 5.6 %   HbA1c POC (<> result, manual entry)     HbA1c, POC (prediabetic range)     HbA1c, POC (controlled diabetic range)        Assessment & Plan:   1. Uncontrolled type 2 diabetes mellitus with CKD - Patient has currently uncontrolled symptomatic type 2 DM since  69 years of age.   He presents with controlled glycemic profile both fasting and postprandial.  His point-of-care A1c is 6.8%, improving from 8.3%.  - His diabetes is complicated by worsening CKD, obesity/sedentary life and patient remains at a high risk for more acute and chronic complications of diabetes which include CAD, CVA, CKD, retinopathy, and neuropathy. These are all discussed in detail with the patient.  - I have counseled the patient on diet management and weight loss, by adopting a carbohydrate  restricted/protein rich diet.  - he  admits there is a room for improvement in his diet and drink choices. -  Suggestion is made for him to avoid simple carbohydrates  from his diet including Cakes, Sweet Desserts / Pastries, Ice Cream, Soda (diet and regular), Sweet Tea, Candies, Chips, Cookies, Sweet Pastries,  Store Bought Juices, Alcohol in Excess of  1-2 drinks a day, Artificial Sweeteners, Coffee Creamer, and "Sugar-free" Products. This will help patient to have stable blood glucose profile and potentially avoid unintended weight gain.   - Patient is advised to stick to a routine mealtimes to eat 3 meals  a day and avoid unnecessary snacks ( to snack only to correct hypoglycemia).   - I have approached patient with the following individualized plan to manage diabetes and patient agrees:   -Based on his presentation with near target  glycemic profile both fasting and postprandial, he will not require prandial insulin for now.   -He is advised to continue Tresiba 50 units nightly, continue monitoring blood glucose twice a day-daily before breakfast and at bedtime as needed.  -His most recent renal function test was normal, he will continue to benefit from low-dose Metformin.  He is advised to continue Metformin 500 mg p.o. twice daily.   -He has also benefited from low-dose glipizide, advised to continue glipizide 5 mg XL p.o. daily at breakfast.  2) Lipids/HPL: His previsit fasting lipid panel showed hypertriglyceridemia 582 increasing from 189.  He was taken off of statins due to transaminitis.  He is liver enzymes are back to normal now.  He will benefit from a higher dose of omega-3 fatty acids.  He is advised to continue omega-3 fatty acids to 2 g p.o. twice daily along with his fenofibrate.   3) hypertension: His blood pressure is not controlled to target. He is encouraged to continue his medications including lisinopril 10 mg p.o. daily.  His lisinopril will be increased to 20 mg daily.  He will be considered for HCTZ next visit if blood pressure remains above 130/80.    4) vitamin D deficiency -He is on ongoing supplement with vitamin D3, ongoing supplement with vitamin D3, daily.  He is advised to continue with vitamin D3 supplement 5000 units daily.    5) weight management: His BMI 33-he is a candidate for modest weight loss.   Carbs and exercise regimen discussed with him.  He is advised to maintain close follow-up with his PMD.   - Time spent on this patient care encounter:  35 min, of which > 50% was spent in  counseling and the rest reviewing his blood glucose logs , discussing his hypoglycemia and hyperglycemia episodes, reviewing his current and  previous labs / studies  ( including abstraction from other facilities) and medications  doses and developing a  long term treatment plan and documenting his care.    Please refer to Patient Instructions for Blood Glucose Monitoring and Insulin/Medications Dosing Guide"  in media tab for additional information. Please  also refer to " Patient Self Inventory" in the Media  tab for reviewed elements of pertinent patient history.  [name] participated in the discussions, expressed understanding, and voiced agreement with the above plans.  All questions were answered to his satisfaction. he is encouraged to contact clinic should he have any questions or concerns prior to his return visit.   Follow up plan: - Return in about 3 months (around 11/15/2019) for Bring Meter and Logs- A1c in Office.  Glade Lloyd,  MD Phone: [phone]  Fax: [phone]  This note was partially dictated with voice recognition software. Similar sounding words can be transcribed inadequately or may not  be corrected upon review.  08/15/2019, 9:41 AM

## 2019-08-15 NOTE — Patient Instructions (Signed)

## 2019-10-08 ENCOUNTER — Other Ambulatory Visit: Payer: Self-pay | Admitting: "Endocrinology

## 2019-10-08 DIAGNOSIS — E782 Mixed hyperlipidemia: Secondary | ICD-10-CM

## 2019-10-19 ENCOUNTER — Other Ambulatory Visit: Payer: Self-pay | Admitting: "Endocrinology

## 2019-10-19 DIAGNOSIS — E1122 Type 2 diabetes mellitus with diabetic chronic kidney disease: Secondary | ICD-10-CM

## 2019-11-21 ENCOUNTER — Ambulatory Visit (INDEPENDENT_AMBULATORY_CARE_PROVIDER_SITE_OTHER): Payer: PPO | Admitting: "Endocrinology

## 2019-11-21 ENCOUNTER — Encounter: Payer: Self-pay | Admitting: "Endocrinology

## 2019-11-21 ENCOUNTER — Other Ambulatory Visit: Payer: Self-pay

## 2019-11-21 VITALS — BP 136/68 | HR 68 | Ht 71.0 in | Wt 236.4 lb

## 2019-11-21 DIAGNOSIS — I10 Essential (primary) hypertension: Secondary | ICD-10-CM

## 2019-11-21 DIAGNOSIS — E559 Vitamin D deficiency, unspecified: Secondary | ICD-10-CM

## 2019-11-21 DIAGNOSIS — N183 Chronic kidney disease, stage 3 unspecified: Secondary | ICD-10-CM

## 2019-11-21 DIAGNOSIS — E782 Mixed hyperlipidemia: Secondary | ICD-10-CM | POA: Diagnosis not present

## 2019-11-21 DIAGNOSIS — E1122 Type 2 diabetes mellitus with diabetic chronic kidney disease: Secondary | ICD-10-CM | POA: Diagnosis not present

## 2019-11-21 DIAGNOSIS — Z794 Long term (current) use of insulin: Secondary | ICD-10-CM | POA: Diagnosis not present

## 2019-11-21 LAB — POCT GLYCOSYLATED HEMOGLOBIN (HGB A1C): Hemoglobin A1C: 6.8 % — AB (ref 4.0–5.6)

## 2019-11-21 NOTE — Progress Notes (Signed)
11/21/2019  11/21/2019  Endocrinology follow-up note   Subjective:    Patient ID: Javier Walker, male    DOB: 01-27-1951.  He is being seen in follow-up  of management of diabetes, Hypertension, hyperlipidemia.   Past Medical History:  Diagnosis Date  . Diabetes mellitus without complication (HCC)   . GERD (gastroesophageal reflux disease)    occ-no meds  . Hives    patient has hives with anxiety  . Hyperlipemia   . Hypertension   . Shortness of breath    Past Surgical History:  Procedure Laterality Date  . CHOLECYSTECTOMY N/A 08/26/2016   Procedure: CHOLECYSTECTOMY;  Surgeon: Franky Macho, MD;  Location: [facility];  Service: General;  Laterality: N/A;  . COLON SURGERY  2011   gangrene lower colon with colostomy  . COLOSTOMY REVERSAL  2011  . ERCP N/A 08/17/2016   Procedure: ENDOSCOPIC RETROGRADE CHOLANGIOPANCREATOGRAPHY (ERCP) Biliary sphincterotomy and stone extraction;  Surgeon: Malissa Hippo, MD;  Location: [facility];  Service: Endoscopy;  Laterality: N/A;  . OPEN REDUCTION INTERNAL FIXATION (ORIF) TIBIA/FIBULA FRACTURE Right 08/30/2012   Procedure: OPEN REDUCTION INTERNAL FIXATION (ORIF) TIBIA/FIBULA FRACTURE;  Surgeon: Toni Arthurs, MD;  Location: [facility];  Service: Orthopedics;  Laterality: Right;  . WOUND DEBRIDEMENT  2011   abd-   Social History   Socioeconomic History  . Marital status: Married    Spouse name: Not on file  . Number of children: Not on file  . Years of education: Not on file  . Highest education level: Not on file  Occupational History  . Not on file  Tobacco Use  . Smoking status: Former Smoker    Packs/day: 2.00    Years: 15.00    Pack years: 30.00    Types: Cigarettes    Quit date: 08/27/1988    Years since quitting: 31.2  . Smokeless tobacco: Never Used  Vaping Use  . Vaping Use: Never used  Substance and Sexual Activity  . Alcohol use: Yes    Comment: rarely  . Drug use: No  . Sexual activity: Yes     Birth control/protection: None  Other Topics Concern  . Not on file  Social History Narrative  . Not on file   Social Determinants of Health   Financial Resource Strain:   . Difficulty of Paying Living Expenses:   Food Insecurity:   . Worried About Programme researcher, broadcasting/film/video in the Last Year:   . Barista in the Last Year:   Transportation Needs:   . Freight forwarder (Medical):   [name] Lack of Transportation (Non-Medical):   Physical Activity:   . Days of Exercise per Week:   . Minutes of Exercise per Session:   Stress:   . Feeling of Stress :   Social Connections:   . Frequency of Communication with Friends and Family:   . Frequency of Social Gatherings with Friends and Family:   . Attends Religious Services:   . Active Member of Clubs or Organizations:   . Attends Banker Meetings:   [name] Marital Status:    Outpatient Encounter Medications as of 11/21/2019  Medication Sig  . acetaminophen (TYLENOL) 325 MG tablet Take 2 tablets (650 mg total) by mouth every 6 (six) hours as needed for mild pain, fever or headache (or Fever >/= 101).  [name] atorvastatin (LIPITOR) 40 MG tablet Take 1 tablet (40 mg total) by mouth every evening.  . Cholecalciferol (VITAMIN D3) 125 MCG (5000 UT)  CAPS Take 1 capsule (5,000 Units total) by mouth daily.  . clopidogrel (PLAVIX) 75 MG tablet Take 1 tablet (75 mg total) by mouth daily. Take with aspirin 81 mg daily until seen by neurologist  . fenofibrate (TRICOR) 145 MG tablet Take 1 tablet by mouth once daily  . glipiZIDE (GLUCOTROL XL) 5 MG 24 hr tablet Take 1 tablet by mouth once daily with breakfast  . insulin degludec (TRESIBA FLEXTOUCH) 100 UNIT/ML SOPN FlexTouch Pen Inject 0.5 mLs (50 Units total) into the skin at bedtime.  [name]. lisinopril (ZESTRIL) 20 MG tablet Take 1 tablet (20 mg total) by mouth daily.  . metFORMIN (GLUCOPHAGE) 500 MG tablet TAKE 1 TABLET BY MOUTH TWICE DAILY WITH A MEAL  . Omega-3 Fatty Acids (FISH OIL) 1200 MG CAPS  Take 2,400 mg by mouth daily.   No facility-administered encounter medications on file as of 11/21/2019.   ALLERGIES: Allergies  Allergen Reactions  . Augmentin [Amoxicillin-Pot Clavulanate] Rash   VACCINATION STATUS:  There is no immunization history on file for this patient.  Diabetes He presents for his follow-up diabetic visit. He has type 2 diabetes mellitus. Onset time: He was diagnosed at approximate age of 69 years. His disease course has been stable. There are no hypoglycemic associated symptoms. Pertinent negatives for hypoglycemia include no confusion, pallor or seizures. Pertinent negatives for diabetes include no fatigue, no polydipsia, no polyphagia, no polyuria and no weakness. There are no hypoglycemic complications. Symptoms are stable. There are no diabetic complications. Risk factors for coronary artery disease include diabetes mellitus, dyslipidemia, hypertension, male sex, obesity, tobacco exposure and sedentary lifestyle. Current diabetic treatment includes oral agent (dual therapy) and insulin injections (Is currently on basal insulin, Metformin, and low-dose glipizide.). He is compliant with treatment most of the time. His weight is increasing steadily. He is following a generally unhealthy diet. When asked about meal planning, he reported none. He has not had a previous visit with a dietitian. He never participates in exercise. His home blood glucose trend is fluctuating minimally. His breakfast blood glucose range is generally 130-140 mg/dl. His overall blood glucose range is 130-140 mg/dl. (He presents with controlled glycemic profile both fasting and postprandial.  His point-of-care A1c is 6.8%, generally improving from 8.3%.  ) An ACE inhibitor/angiotensin II receptor blocker is being taken. Eye exam is current.  Hyperlipidemia This is a chronic problem. The current episode started more than 1 year ago. The problem is uncontrolled. Exacerbating diseases include diabetes  and obesity. Pertinent negatives include no myalgias. Current antihyperlipidemic treatment includes statins. Risk factors for coronary artery disease include diabetes mellitus, dyslipidemia, hypertension, male sex, obesity and a sedentary lifestyle.    Review of systems  Constitutional: + Minimally fluctuating body weight,  current  Body mass index is 32.97 kg/m. , no fatigue, no subjective hyperthermia, no subjective hypothermia Eyes: no blurry vision, no xerophthalmia ENT: no sore throat, no nodules palpated in throat, no dysphagia/odynophagia, no hoarseness Cardiovascular: no Chest Pain, no Shortness of Breath, no palpitations, no leg swelling Respiratory: no cough, no shortness of breath Gastrointestinal: no Nausea/Vomiting/Diarhhea Musculoskeletal: no muscle/joint aches Skin: no rashes, no hyperemia Neurological: no tremors, no numbness, no tingling, no dizziness Psychiatric: no depression, no anxiety   Objective:    BP 136/68   Pulse 68   Ht 5\' 11"  (1.803 m)   Wt 236 lb 6.4 oz (107.2 kg)   BMI 32.97 kg/m   Wt Readings from Last 3 Encounters:  11/21/19 236 lb 6.4 oz (107.2 kg)  08/15/19 237 lb 3.2 oz (107.6 kg)  04/17/19 238 lb (108 kg)     Physical Exam- Limited  Constitutional:  Body mass index is 32.97 kg/m. , not in acute distress, normal state of mind Eyes:  EOMI, no exophthalmos Neck: Supple Thyroid: No gross goiter Respiratory: Adequate breathing efforts Musculoskeletal: no gross deformities, strength intact in all four extremities, no gross restriction of joint movements Skin:  no rashes, no hyperemia Neurological: no tremor with outstretched hands   Lipid Panel     Component Value Date/Time   CHOL 186 07/29/2019 0924   TRIG 270 (H) 07/29/2019 0924   HDL 25 (L) 07/29/2019 0924   CHOLHDL 7.4 (H) 07/29/2019 0924   VLDL 54 (H) 06/03/2018 0930   LDLCALC 119 (H) 07/29/2019 0924   Recent Results (from the past 2160 hour(s))  HgB A1c     Status: Abnormal    Collection Time: 11/21/19  8:35 AM  Result Value Ref Range   Hemoglobin A1C 6.8 (A) 4.0 - 5.6 %   HbA1c POC (<> result, manual entry)     HbA1c, POC (prediabetic range)     HbA1c, POC (controlled diabetic range)      CMP Latest Ref Rng & Units 07/29/2019 11/14/2018 07/02/2018  Glucose 65 - 99 mg/dL 163(W) 466(Z) 993(T)  BUN 7 - 25 mg/dL 17 21 16   Creatinine 0.70 - 1.25 mg/dL ) 7.01(X 7.93  Sodium 135 - 146 mmol/L 137 139 138  Potassium 3.5 - 5.3 mmol/L 4.5 4.9 4.4  Chloride 98 - 110 mmol/L 103 103 103  CO2 20 - 32 mmol/L 26 29 27   Calcium 8.6 - 10.3 mg/dL 9.8 9.9 9.2  Total Protein 6.1 - 8.1 g/dL 6.8 7.2 6.9  Total Bilirubin 0.2 - 1.2 mg/dL 0.7 0.7 0.5  Alkaline Phos 38 - 126 U/L - - -  AST 10 - 35 U/L 30 33 22  ALT 9 - 46 U/L 31 32 26     Assessment & Plan:   1. Uncontrolled type 2 diabetes mellitus with CKD - Patient has currently uncontrolled symptomatic type 2 DM since  69 years of age.   He presents with controlled glycemic profile both fasting and postprandial.  His point-of-care A1c is 6.8%, generally improving from 8.3%.    - His diabetes is complicated by worsening CKD, obesity/sedentary life and patient remains at a high risk for more acute and chronic complications of diabetes which include CAD, CVA, CKD, retinopathy, and neuropathy. These are all discussed in detail with the patient.  - I have counseled the patient on diet management and weight loss, by adopting a carbohydrate restricted/protein rich diet.  - he  admits there is a room for improvement in his diet and drink choices. -  Suggestion is made for him to avoid simple carbohydrates  from his diet including Cakes, Sweet Desserts / Pastries, Ice Cream, Soda (diet and regular), Sweet Tea, Candies, Chips, Cookies, Sweet Pastries,  Store Bought Juices, Alcohol in Excess of  1-2 drinks a day, Artificial Sweeteners, Coffee Creamer, and "Sugar-free" Products. This will help patient to have stable blood glucose  profile and potentially avoid unintended weight gain.   - Patient is advised to stick to a routine mealtimes to eat 3 meals  a day and avoid unnecessary snacks ( to snack only to correct hypoglycemia).   - I have approached patient with the following individualized plan to manage diabetes and patient agrees:   -Based on his presentation with near target glycemic profile  both fasting and postprandial, he will not require prandial insulin for now.  -He is advised to continue Tresiba 50 units nightly, continue monitoring blood glucose twice a day-daily before breakfast and at bedtime as needed.  -His most recent renal function test was normal, he will continue to benefit from low-dose Metformin.  He is advised to continue Metformin 500 mg p.o. twice daily-daily after breakfast and after supper.   -If benefiting from low-dose glipizide, advised to continue glipizide 5 mg XL p.o. daily at breakfast.    2) Lipids/HPL: His previsit fasting lipid panel showed hypertriglyceridemia 582 increasing from 189.  He was taken off of statins due to transaminitis.  His liver enzymes are back to normal now.  He will benefit from a higher dose of omega-3 fatty acids.  He is advised to continue omega-3 fatty acids to 2 g p.o. twice daily along with his fenofibrate.   3) hypertension: His blood pressure is controlled to target.  He is encouraged to continue his medications including lisinopril 20 mg p.o. daily.  He will be considered for HCTZ next visit if blood pressure remains above 130/80.    4) vitamin D deficiency -He is on ongoing supplement with vitamin D3, ongoing supplement with vitamin D3, daily.  He is advised to continue with vitamin D3 supplement 5000 units daily.    5) weight management: His BMI 32.97-he is a candidate for modest weight loss.   Carbs and exercise regimen discussed with him.  He is advised to maintain close follow-up with his PMD.   - Time spent on this patient care encounter:  35  min, of which > 50% was spent in  counseling and the rest reviewing his blood glucose logs , discussing his hypoglycemia and hyperglycemia episodes, reviewing his current and  previous labs / studies  ( including abstraction from other facilities) and medications  doses and developing a  long term treatment plan and documenting his care.   Please refer to Patient Instructions for Blood Glucose Monitoring and Insulin/Medications Dosing Guide"  in media tab for additional information. Please  also refer to " Patient Self Inventory" in the Media  tab for reviewed elements of pertinent patient history.  [name] participated in the discussions, expressed understanding, and voiced agreement with the above plans.  All questions were answered to his satisfaction. he is encouraged to contact clinic should he have any questions or concerns prior to his return visit.    Follow up plan: - Return in about 4 months (around 03/22/2020) for F/U with Pre-visit Labs, Meter, Logs, A1c here., [place] with [facility].  Marquis Lunch, MD Phone: [phone]  Fax: [phone]  This note was partially dictated with voice recognition software. Similar sounding words can be transcribed inadequately or may not  be corrected upon review.  11/21/2019, 8:48 AM

## 2019-11-21 NOTE — Patient Instructions (Signed)

## 2019-12-02 ENCOUNTER — Other Ambulatory Visit: Payer: Self-pay | Admitting: "Endocrinology

## 2019-12-02 DIAGNOSIS — E1122 Type 2 diabetes mellitus with diabetic chronic kidney disease: Secondary | ICD-10-CM

## 2020-01-07 ENCOUNTER — Other Ambulatory Visit: Payer: Self-pay | Admitting: "Endocrinology

## 2020-01-24 ENCOUNTER — Other Ambulatory Visit: Payer: Self-pay | Admitting: "Endocrinology

## 2020-01-24 DIAGNOSIS — E782 Mixed hyperlipidemia: Secondary | ICD-10-CM

## 2020-02-12 ENCOUNTER — Other Ambulatory Visit: Payer: Self-pay | Admitting: "Endocrinology

## 2020-02-21 ENCOUNTER — Other Ambulatory Visit: Payer: Self-pay | Admitting: "Endocrinology

## 2020-02-21 DIAGNOSIS — E1122 Type 2 diabetes mellitus with diabetic chronic kidney disease: Secondary | ICD-10-CM

## 2020-03-02 ENCOUNTER — Other Ambulatory Visit: Payer: Self-pay | Admitting: "Endocrinology

## 2020-03-02 DIAGNOSIS — Z794 Long term (current) use of insulin: Secondary | ICD-10-CM

## 2020-03-04 ENCOUNTER — Other Ambulatory Visit: Payer: Self-pay | Admitting: "Endocrinology

## 2020-03-04 DIAGNOSIS — Z794 Long term (current) use of insulin: Secondary | ICD-10-CM | POA: Diagnosis not present

## 2020-03-04 DIAGNOSIS — N183 Chronic kidney disease, stage 3 unspecified: Secondary | ICD-10-CM | POA: Diagnosis not present

## 2020-03-04 DIAGNOSIS — E1122 Type 2 diabetes mellitus with diabetic chronic kidney disease: Secondary | ICD-10-CM | POA: Diagnosis not present

## 2020-03-05 LAB — TSH: TSH: 4.7 mIU/L — ABNORMAL HIGH (ref 0.40–4.50)

## 2020-03-05 LAB — COMPLETE METABOLIC PANEL WITH GFR
AG Ratio: 1.3 (calc) (ref 1.0–2.5)
ALT: 25 U/L (ref 9–46)
AST: 27 U/L (ref 10–35)
Albumin: 4.1 g/dL (ref 3.6–5.1)
Alkaline phosphatase (APISO): 33 U/L — ABNORMAL LOW (ref 35–144)
BUN/Creatinine Ratio: 14 (calc) (ref 6–22)
BUN: 19 mg/dL (ref 7–25)
CO2: 26 mmol/L (ref 20–32)
Calcium: 9.8 mg/dL (ref 8.6–10.3)
Chloride: 105 mmol/L (ref 98–110)
Creat: 1.33 mg/dL — ABNORMAL HIGH (ref 0.70–1.25)
GFR, Est African American: 63 mL/min/{1.73_m2} (ref >=60)
GFR, Est Non African American: 54 mL/min/{1.73_m2} — ABNORMAL LOW (ref >=60)
Globulin: 3.1 g/dL (calc) (ref 1.9–3.7)
Glucose, Bld: 131 mg/dL — ABNORMAL HIGH (ref 65–99)
Potassium: 4.6 mmol/L (ref 3.5–5.3)
Sodium: 139 mmol/L (ref 135–146)
Total Bilirubin: 0.4 mg/dL (ref 0.2–1.2)
Total Protein: 7.2 g/dL (ref 6.1–8.1)

## 2020-03-05 LAB — LIPID PANEL
Cholesterol: 208 mg/dL — ABNORMAL HIGH (ref <200)
HDL: 20 mg/dL — ABNORMAL LOW (ref >=40)
Non-HDL Cholesterol (Calc): 188 mg/dL (calc) — ABNORMAL HIGH (ref <130)
Total CHOL/HDL Ratio: 10.4 (calc) — ABNORMAL HIGH (ref <5.0)
Triglycerides: 401 mg/dL — ABNORMAL HIGH (ref <150)

## 2020-03-05 LAB — T4, FREE: Free T4: 1.1 ng/dL (ref 0.8–1.8)

## 2020-03-23 ENCOUNTER — Ambulatory Visit: Payer: PPO | Admitting: Nurse Practitioner

## 2020-04-06 ENCOUNTER — Other Ambulatory Visit: Payer: Self-pay | Admitting: "Endocrinology

## 2020-04-08 ENCOUNTER — Encounter: Payer: Self-pay | Admitting: Nurse Practitioner

## 2020-04-08 ENCOUNTER — Ambulatory Visit: Payer: PPO | Admitting: Nurse Practitioner

## 2020-04-08 ENCOUNTER — Other Ambulatory Visit: Payer: Self-pay

## 2020-04-08 VITALS — BP 141/75 | HR 82 | Ht 71.0 in | Wt 235.0 lb

## 2020-04-08 DIAGNOSIS — Z794 Long term (current) use of insulin: Secondary | ICD-10-CM | POA: Diagnosis not present

## 2020-04-08 DIAGNOSIS — Z1331 Encounter for screening for depression: Secondary | ICD-10-CM | POA: Diagnosis not present

## 2020-04-08 DIAGNOSIS — E6609 Other obesity due to excess calories: Secondary | ICD-10-CM | POA: Diagnosis not present

## 2020-04-08 DIAGNOSIS — I10 Essential (primary) hypertension: Secondary | ICD-10-CM | POA: Diagnosis not present

## 2020-04-08 DIAGNOSIS — N1831 Chronic kidney disease, stage 3a: Secondary | ICD-10-CM | POA: Diagnosis not present

## 2020-04-08 DIAGNOSIS — E559 Vitamin D deficiency, unspecified: Secondary | ICD-10-CM

## 2020-04-08 DIAGNOSIS — N183 Chronic kidney disease, stage 3 unspecified: Secondary | ICD-10-CM | POA: Diagnosis not present

## 2020-04-08 DIAGNOSIS — E1122 Type 2 diabetes mellitus with diabetic chronic kidney disease: Secondary | ICD-10-CM | POA: Diagnosis not present

## 2020-04-08 DIAGNOSIS — Z0001 Encounter for general adult medical examination with abnormal findings: Secondary | ICD-10-CM | POA: Diagnosis not present

## 2020-04-08 DIAGNOSIS — E782 Mixed hyperlipidemia: Secondary | ICD-10-CM | POA: Diagnosis not present

## 2020-04-08 DIAGNOSIS — Z6832 Body mass index (BMI) 32.0-32.9, adult: Secondary | ICD-10-CM | POA: Diagnosis not present

## 2020-04-08 DIAGNOSIS — E1165 Type 2 diabetes mellitus with hyperglycemia: Secondary | ICD-10-CM | POA: Diagnosis not present

## 2020-04-08 LAB — POCT GLYCOSYLATED HEMOGLOBIN (HGB A1C): HbA1c, POC (controlled diabetic range): 6.9 % (ref 0.0–7.0)

## 2020-04-08 MED ORDER — METFORMIN HCL 500 MG PO TABS: 500.0000 mg | ORAL_TABLET | Freq: Every day | ORAL | 3 refills | Status: DC

## 2020-04-08 MED ORDER — ONETOUCH VERIO VI STRP: ORAL_STRIP | 12 refills | Status: AC

## 2020-04-08 NOTE — Progress Notes (Signed)
04/08/2020  04/08/2020  Endocrinology follow-up note   Subjective:    Patient ID: Javier Walker, male    DOB: 09/01/1950.  He is being seen in follow-up  of management of diabetes, Hypertension, hyperlipidemia.   Past Medical History:  Diagnosis Date   Diabetes mellitus without complication (HCC)    GERD (gastroesophageal reflux disease)    occ-no meds   Hives    patient has hives with anxiety   Hyperlipemia    Hypertension    Shortness of breath    Past Surgical History:  Procedure Laterality Date   CHOLECYSTECTOMY N/A 08/26/2016   Procedure: CHOLECYSTECTOMY;  Surgeon: Franky Macho, MD;  Location: [facility];  Service: General;  Laterality: N/A;   COLON SURGERY  2011   gangrene lower colon with colostomy   COLOSTOMY REVERSAL  2011   ERCP N/A 08/17/2016   Procedure: ENDOSCOPIC RETROGRADE CHOLANGIOPANCREATOGRAPHY (ERCP) Biliary sphincterotomy and stone extraction;  Surgeon: Malissa Hippo, MD;  Location: [facility];  Service: Endoscopy;  Laterality: N/A;   OPEN REDUCTION INTERNAL FIXATION (ORIF) TIBIA/FIBULA FRACTURE Right 08/30/2012   Procedure: OPEN REDUCTION INTERNAL FIXATION (ORIF) TIBIA/FIBULA FRACTURE;  Surgeon: Toni Arthurs, MD;  Location: [facility];  Service: Orthopedics;  Laterality: Right;   WOUND DEBRIDEMENT  2011   abd-   Social History   Socioeconomic History   Marital status: Married    Spouse name: Not on file   Number of children: Not on file   Years of education: Not on file   Highest education level: Not on file  Occupational History   Not on file  Tobacco Use   Smoking status: Former Smoker    Packs/day: 2.00    Years: 15.00    Pack years: 30.00    Types: Cigarettes    Quit date: 08/27/1988    Years since quitting: 31.6   Smokeless tobacco: Never Used  Vaping Use   Vaping Use: Never used  Substance and Sexual Activity   Alcohol use: Yes    Comment: rarely   Drug use: No   Sexual activity: Yes     Birth control/protection: None  Other Topics Concern   Not on file  Social History Narrative   Not on file   Social Determinants of Health   Financial Resource Strain: Not on file  Food Insecurity: Not on file  Transportation Needs: Not on file  Physical Activity: Not on file  Stress: Not on file  Social Connections: Not on file   Outpatient Encounter Medications as of 04/08/2020  Medication Sig   acetaminophen (TYLENOL) 325 MG tablet Take 2 tablets (650 mg total) by mouth every 6 (six) hours as needed for mild pain, fever or headache (or Fever >/= 101).   atorvastatin (LIPITOR) 40 MG tablet Take 1 tablet (40 mg total) by mouth every evening.   Cholecalciferol (VITAMIN D3) 125 MCG (5000 UT) CAPS Take 1 capsule (5,000 Units total) by mouth daily.   clopidogrel (PLAVIX) 75 MG tablet Take 1 tablet (75 mg total) by mouth daily. Take with aspirin 81 mg daily until seen by neurologist   fenofibrate (TRICOR) 145 MG tablet Take 1 tablet by mouth once daily   glipiZIDE (GLUCOTROL XL) 5 MG 24 hr tablet Take 1 tablet by mouth once daily with breakfast   glucose blood (ONETOUCH VERIO) test strip Use as instructed to monitor glucose twice daily, before breakfast and before bed.   Insulin Pen Needle (B-D ULTRAFINE III SHORT PEN) 31G X 8 MM MISC 1  each by Other route daily. Use to inject insulin daily.   lisinopril (ZESTRIL) 20 MG tablet Take 1 tablet (20 mg total) by mouth daily.   Omega-3 Fatty Acids (FISH OIL) 1200 MG CAPS Take 2,400 mg by mouth daily.   TRESIBA FLEXTOUCH 100 UNIT/ML FlexTouch Pen INJECT 50 UNITS SUBCUTANEOUSLY ONCE DAILY AT BEDTIME   [DISCONTINUED] metFORMIN (GLUCOPHAGE) 500 MG tablet TAKE 1 TABLET BY MOUTH TWICE DAILY WITH A MEAL   metFORMIN (GLUCOPHAGE) 500 MG tablet Take 1 tablet (500 mg total) by mouth daily with breakfast.   [DISCONTINUED] lisinopril (ZESTRIL) 5 MG tablet Take 5 mg by mouth daily.   No facility-administered encounter medications on file as  of 04/08/2020.   ALLERGIES: Allergies  Allergen Reactions   Augmentin [Amoxicillin-Pot Clavulanate] Rash   VACCINATION STATUS: Immunization History  Administered Date(s) Administered   Influenza-Unspecified 02/03/2020    Diabetes He presents for his follow-up diabetic visit. He has type 2 diabetes mellitus. Onset time: He was diagnosed at approximate age of 69 years. His disease course has been stable. There are no hypoglycemic associated symptoms. Pertinent negatives for hypoglycemia include no confusion, pallor or seizures. Pertinent negatives for diabetes include no fatigue, no polydipsia, no polyphagia, no polyuria and no weakness. There are no hypoglycemic complications. Symptoms are stable. There are no diabetic complications. Risk factors for coronary artery disease include diabetes mellitus, dyslipidemia, hypertension, male sex, obesity, tobacco exposure and sedentary lifestyle. Current diabetic treatment includes oral agent (dual therapy) and insulin injections. He is compliant with treatment most of the time. His weight is decreasing steadily. He is following a generally unhealthy diet. When asked about meal planning, he reported none. He has not had a previous visit with a dietitian. He never participates in exercise. His home blood glucose trend is fluctuating minimally. His breakfast blood glucose range is generally 110-130 mg/dl. His overall blood glucose range is 110-130 mg/dl. (He presents today, with his meter and logs, showing stable, near target fasting and postprandial glycemic profile.  His POCT A1c today is 6.9%, essentially unchanged from last visit of 6.8%.  There are no episodes of hypoglycemia reported or documented. 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 He does not see a podiatrist.Eye exam is current.  Hyperlipidemia This is a  chronic problem. The current episode started more than 1 year ago. The problem is uncontrolled. Recent lipid tests were reviewed and are high. Exacerbating diseases include chronic renal disease, diabetes and obesity. Factors aggravating his hyperlipidemia include fatty foods. Pertinent negatives include no myalgias. Current antihyperlipidemic treatment includes statins and fibric acid derivatives. The current treatment provides mild improvement of lipids. Compliance problems include adherence to diet and adherence to exercise.  Risk factors for coronary artery disease include diabetes mellitus, dyslipidemia, hypertension, male sex, obesity and a sedentary lifestyle.   Review of systems  Constitutional: + Minimally fluctuating body weight,  current Body mass index is 32.78 kg/m. , no fatigue, no subjective hyperthermia, no subjective hypothermia Eyes: no blurry vision, no xerophthalmia ENT: no sore throat, no nodules palpated in throat, no dysphagia/odynophagia, no hoarseness Cardiovascular: no chest pain, no shortness of breath, no palpitations, no leg swelling Respiratory: no cough, no shortness of breath Gastrointestinal: no nausea/vomiting/diarrhea Musculoskeletal: no muscle/joint aches Skin: no rashes, no hyperemia Neurological: no tremors, no numbness, no tingling, no dizziness Psychiatric: no depression, no anxiety   Objective:  BP (!) 141/75 (BP Location: Right Arm)    Pulse 82    Ht 5\' 11"  (1.803 m)    Wt 235 lb (106.6 kg)    BMI 32.78 kg/m   Wt Readings from Last 3 Encounters:  04/08/20 235 lb (106.6 kg)  11/21/19 236 lb 6.4 oz (107.2 kg)  08/15/19 237 lb 3.2 oz (107.6 kg)    BP Readings from Last 3 Encounters:  04/08/20 (!) 141/75  11/21/19 136/68  08/15/19 (!) 166/78    Physical Exam- Limited  Constitutional:  Body mass index is 32.78 kg/m. , not in acute distress, normal state of mind Eyes:  EOMI, no exophthalmos Neck: Supple Cardiovascular: RRR, no murmers, rubs,  or gallops, no edema Respiratory: Adequate breathing efforts, no crackles, rales, rhonchi, or wheezing Musculoskeletal: no gross deformities, strength intact in all four extremities, no gross restriction of joint movements Skin:  no rashes, no hyperemia Neurological: no tremor with outstretched hands   Foot exam:   No rashes, ulcers, cuts, + calluses, + onychodystrophy, overgrown toenails (wife cuts them for him)  Good pulses bilat.  Good sensation to 10 g monofilament bilat.    POCT ABI Results 04/08/20   Right ABI:  1.26      Left ABI:  1.01  Right leg systolic / diastolic: 178/60 mmHg Left leg systolic / diastolic: 142/58 mmHg  Arm systolic / diastolic: 141/75 mmHG  Detailed report will be scanned into patient chart.  Lipid Panel     Component Value Date/Time   CHOL 208 (H) 03/04/2020 0709   TRIG 401 (H) 03/04/2020 0709   HDL 20 (L) 03/04/2020 0709   CHOLHDL 10.4 (H) 03/04/2020 0709   VLDL 54 (H) 06/03/2018 0930   LDLCALC  03/04/2020 0709     Comment:     . LDL cholesterol not calculated. Triglyceride levels greater than 400 mg/dL invalidate calculated LDL results. . Reference range: <100 . Desirable range <100 mg/dL for primary prevention;   <70 mg/dL for patients with CHD or diabetic patients  with > or = 2 CHD risk factors. 03/06/2020 LDL-C is now calculated using the Martin-Hopkins  calculation, which is a validated novel method providing  better accuracy than the Friedewald equation in the  estimation of LDL-C.  [name] et al. Horald Pollen. Lenox Ahr): 2061-2068  (http://education.[facility].com/faq/FAQ164)    Recent Results (from the past 2160 hour(s))  Lipid panel     Status: Abnormal   Collection Time: 03/04/20  7:09 AM  Result Value Ref Range   Cholesterol 208 (H) <200 mg/dL   HDL 20 (L) > OR = 40 mg/dL   Triglycerides 03/06/20 (H) <150 mg/dL    Comment: . If a non-fasting specimen was collected, consider repeat triglyceride testing on a fasting  specimen if clinically indicated.  440 et al. J. of Clin. Lipidol. 2015;9:129-169. [place]    LDL Cholesterol (Calc)  mg/dL (calc)    Comment: . LDL cholesterol not calculated. Triglyceride levels greater than 400 mg/dL invalidate calculated LDL results. . Reference range: <100 . Desirable range <100 mg/dL for primary prevention;   <70 mg/dL for patients with CHD or diabetic patients  with > or = 2 CHD risk factors. [name] LDL-C is now calculated using the Martin-Hopkins  calculation, which is a validated novel method providing  better accuracy than the Friedewald equation in the  estimation of LDL-C.  [name] et al. Horald Pollen. Lenox Ahr): 2061-2068  (http://education.[facility].com/faq/FAQ164)    Total CHOL/HDL Ratio 10.4 (H) <5.0 (calc)   Non-HDL Cholesterol (Calc)  188 (H) <130 mg/dL (calc)    Comment: For patients with diabetes plus 1 major ASCVD risk  factor, treating to a non-HDL-C goal of <100 mg/dL  (LDL-C of <09 mg/dL) is considered a therapeutic  option.   COMPLETE METABOLIC PANEL WITH GFR     Status: Abnormal   Collection Time: 03/04/20  7:09 AM  Result Value Ref Range   Glucose, Bld 131 (H) 65 - 99 mg/dL    Comment: .            Fasting reference interval . For someone without known diabetes, a glucose value >125 mg/dL indicates that they may have diabetes and this should be confirmed with a follow-up test. .    BUN 19 7 - 25 mg/dL   Creat 7.35 (H) 3.29 - 1.25 mg/dL    Comment: For patients >5 years of age, the reference limit for Creatinine is approximately 13% higher for people identified as African-American. .    GFR, Est Non African American 54 (L) > OR = 60 mL/min/1.22m2   GFR, Est African American 63 > OR = 60 mL/min/1.33m2   BUN/Creatinine Ratio 14 6 - 22 (calc)   Sodium 139 135 - 146 mmol/L   Potassium 4.6 3.5 - 5.3 mmol/L   Chloride 105 98 - 110 mmol/L   CO2 26 20 - 32 mmol/L   Calcium 9.8 8.6 - 10.3 mg/dL   Total Protein 7.2 6.1 - 8.1  g/dL   Albumin 4.1 3.6 - 5.1 g/dL   Globulin 3.1 1.9 - 3.7 g/dL (calc)   AG Ratio 1.3 1.0 - 2.5 (calc)   Total Bilirubin 0.4 0.2 - 1.2 mg/dL   Alkaline phosphatase (APISO) 33 (L) 35 - 144 U/L   AST 27 10 - 35 U/L   ALT 25 9 - 46 U/L  TSH     Status: Abnormal   Collection Time: 03/04/20  7:09 AM  Result Value Ref Range   TSH 4.70 (H) 0.40 - 4.50 mIU/L  T4, free     Status: None   Collection Time: 03/04/20  7:09 AM  Result Value Ref Range   Free T4 1.1 0.8 - 1.8 ng/dL  HgB J2E     Status: Abnormal   Collection Time: 04/08/20 10:27 AM  Result Value Ref Range   Hemoglobin A1C     HbA1c POC (<> result, manual entry)     HbA1c, POC (prediabetic range)     HbA1c, POC (controlled diabetic range) 6.9 0.0 - 7.0 %    CMP Latest Ref Rng & Units 03/04/2020 07/29/2019 11/14/2018  Glucose 65 - 99 mg/dL 268(T) 419(Q) 222(L)  BUN 7 - 25 mg/dL 19 17 21   Creatinine 0.70 - 1.25 mg/dL ) 7.98(X) 2.11(H  Sodium 135 - 146 mmol/L 139 137 139  Potassium 3.5 - 5.3 mmol/L 4.6 4.5 4.9  Chloride 98 - 110 mmol/L 105 103 103  CO2 20 - 32 mmol/L 26 26 29   Calcium 8.6 - 10.3 mg/dL 9.8 9.8 9.9  Total Protein 6.1 - 8.1 g/dL 7.2 6.8 7.2  Total Bilirubin 0.2 - 1.2 mg/dL 0.4 0.7 0.7  Alkaline Phos 38 - 126 U/L - - -  AST 10 - 35 U/L 27 30 33  ALT 9 - 46 U/L 25 31 32     Assessment & Plan:   1) Uncontrolled type 2 diabetes mellitus with CKD  - Patient has currently uncontrolled symptomatic type 2 DM since  69 years of age.  He presents today, with  his meter and logs, showing stable, near target fasting and postprandial glycemic profile.  His POCT A1c today is 6.9%, essentially unchanged from last visit of 6.8%.  There are no episodes of hypoglycemia reported or documented. 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  - His diabetes is complicated by worsening CKD, obesity/sedentary life and patient remains at a high risk for more  acute and chronic complications of diabetes which include CAD, CVA, CKD, retinopathy, and neuropathy. These are all discussed in detail with the patient.  - Nutritional counseling repeated at each appointment due to patients tendency to fall back in to old habits.  - The patient admits there is a room for improvement in their diet and drink choices. -  Suggestion is made for the patient to avoid simple carbohydrates from their diet including Cakes, Sweet Desserts / Pastries, Ice Cream, Soda (diet and regular), Sweet Tea, Candies, Chips, Cookies, Sweet Pastries,  Store Bought Juices, Alcohol in Excess of  1-2 drinks a day, Artificial Sweeteners, Coffee Creamer, and "Sugar-free" Products. This will help patient to have stable blood glucose profile and potentially avoid unintended weight gain.   - I encouraged the patient to switch to  unprocessed or minimally processed complex starch and increased protein intake (animal or plant source), fruits, and vegetables.   - Patient is advised to stick to a routine mealtimes to eat 3 meals  a day and avoid unnecessary snacks ( to snack only to correct hypoglycemia).  - I have approached patient with the following individualized plan to manage diabetes and patient agrees:   -Based on his presentation with near target glycemic profile both fasting and postprandial, he will not require prandial insulin for now.  -He is advised to continue Tresiba 50 units nightly and continue Glipizide 5 mg XL daily with breakfast.  Due to steady decline in renal function, will lower dose of Metformin to 500 mg po once daily with breakfast.   -He is encouraged to continue monitoring blood glucose twice daily, before meals and before bed, and to call the clinic if he has readings less than 70 or greater than 200 for 3 tests in a row.  2) Lipids/HPL:  His most recent lipid panel from 03/04/20 shows uncontrolled LDL (not calculated due to elevated triglycerides) and elevated  triglycerides of 401.  He is advised to continue Lipitor 40 mg po daily at bedtime, Fenofibrate 145 mg po daily and Fish Oil supplement 2 g po twice daily.  He is also advised to avoid fried foods and butter, when possible.  3) Hypertension:  His blood pressure is generally controlled to target, just slightly elevated today.  He is encouraged to continue his medications including Lisinopril 20 mg p.o. daily.  He will be considered for HCTZ next visit if blood pressure remains above 130/80.   4) Vitamin D deficiency -He is on ongoing supplement with vitamin D3, ongoing supplement with vitamin D3, daily.  He is advised to continue with vitamin D3 supplement 5000 units daily during the winter months.  5) Weight management:  His Body mass index is 32.78 kg/m.--clearly complicating his diabetes management  He is a candidate for modest weight loss.  Carbs and exercise regimen discussed with him.  He is advised to maintain close follow-up with his PMD.   - Time spent on this patient care encounter:  35 min, of which > 50% was spent in  counseling  and the rest reviewing his blood glucose logs , discussing his hypoglycemia and hyperglycemia episodes, reviewing his current and  previous labs / studies  ( including abstraction from other facilities) and medications  doses and developing a  long term treatment plan and documenting his care.   Please refer to Patient Instructions for Blood Glucose Monitoring and Insulin/Medications Dosing Guide"  in media tab for additional information. Please  also refer to " Patient Self Inventory" in the Media  tab for reviewed elements of pertinent patient history.  [name] participated in the discussions, expressed understanding, and voiced agreement with the above plans.  All questions were answered to his satisfaction. he is encouraged to contact clinic should he have any questions or concerns prior to his return visit.    Follow up plan: - Return in about 4  months (around 08/07/2020) for Diabetes follow up- A1c and urine micro in office, Previsit labs, Bring glucometer and logs.  Ronny Bacon, [facility] [address] [place], [place] [address] Phone: [phone] Fax: [phone]  04/08/2020, 10:53 AM

## 2020-04-08 NOTE — Patient Instructions (Signed)

## 2020-04-23 ENCOUNTER — Other Ambulatory Visit: Payer: Self-pay | Admitting: "Endocrinology

## 2020-04-23 DIAGNOSIS — E782 Mixed hyperlipidemia: Secondary | ICD-10-CM

## 2020-06-03 ENCOUNTER — Other Ambulatory Visit: Payer: Self-pay | Admitting: "Endocrinology

## 2020-06-03 DIAGNOSIS — Z794 Long term (current) use of insulin: Secondary | ICD-10-CM

## 2020-06-03 DIAGNOSIS — N183 Chronic kidney disease, stage 3 unspecified: Secondary | ICD-10-CM

## 2020-06-08 IMAGING — CT CT HEAD W/O CM
3 series · 16 of 46 positions shown, 19 images · non-contrast
Comparison: None.

CLINICAL DATA: Possible stroke.  Left-sided weakness

EXAM:
CT HEAD WITHOUT CONTRAST
TECHNIQUE: Contiguous axial images were obtained from the base of the skull
through the vertex without intravenous contrast.

[Series 2: head wo · axial · 0.46mm/px · z∈[+41,+161]mm · 10 of 29 slices shown, 13 images]
[im 3/29  brain]
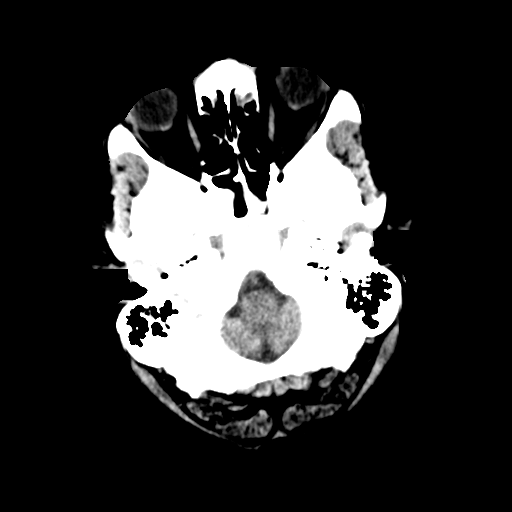
[im 3/29  bone]
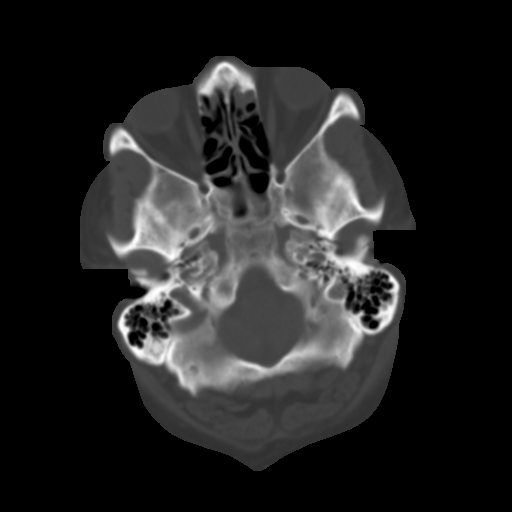
[im 6/29  brain]
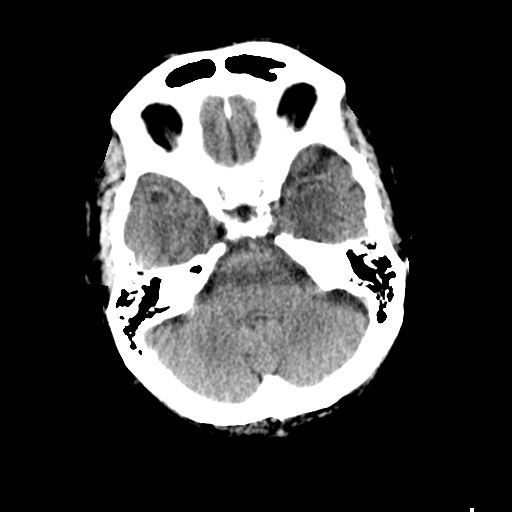
[im 8/29  brain]
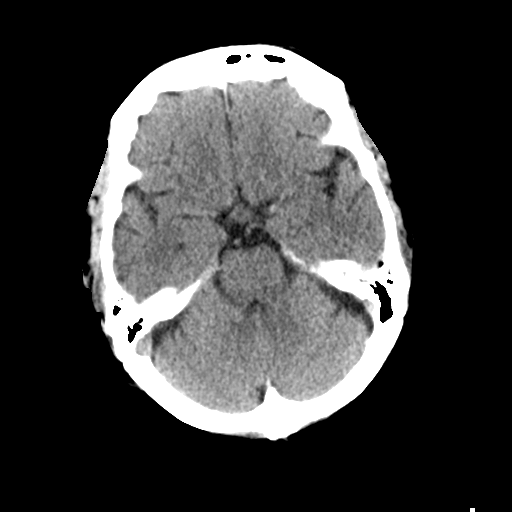
[im 11/29  brain]
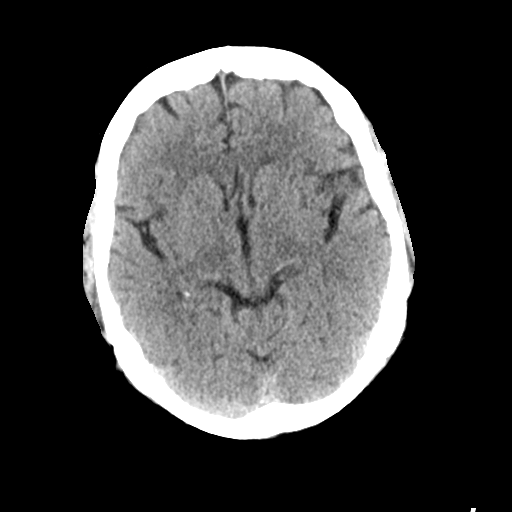
[im 14/29  brain]
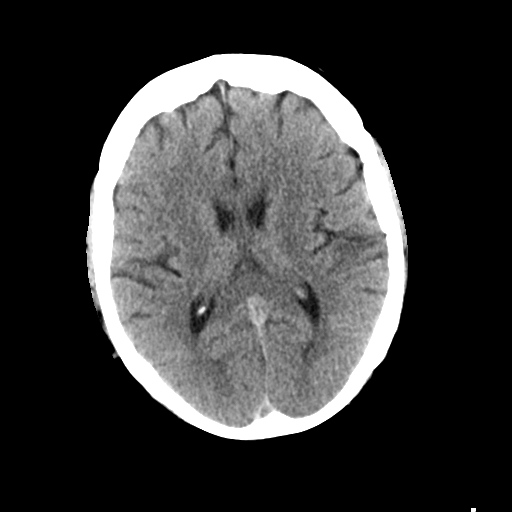
[im 14/29  bone]
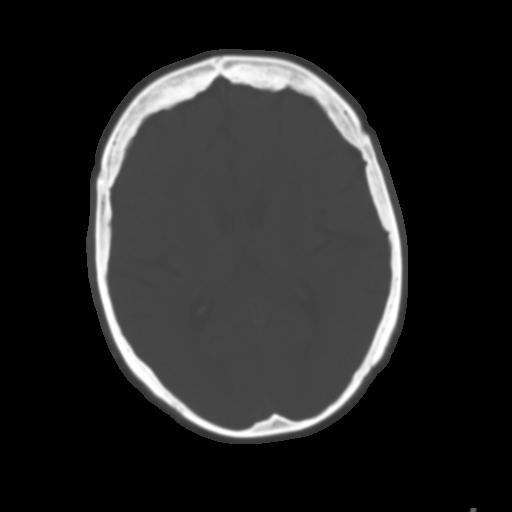
[im 16/29  brain]
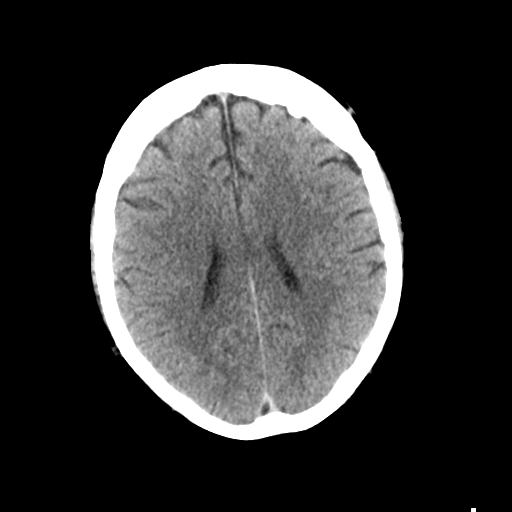
[im 19/29  brain]
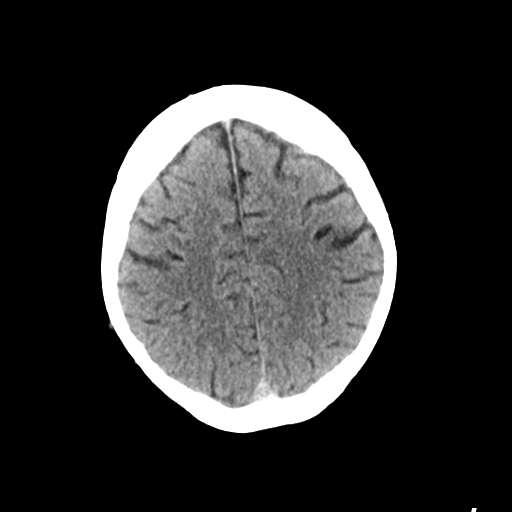
[im 22/29  brain]
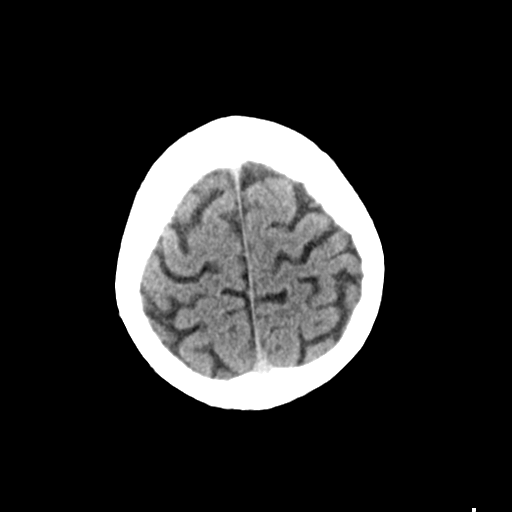
[im 24/29  brain]
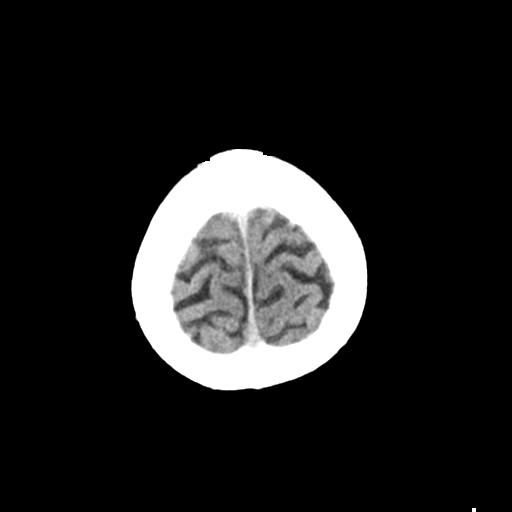
[im 24/29  bone]
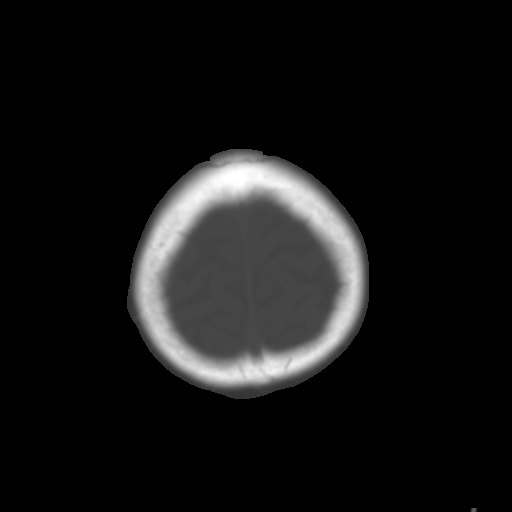
[im 27/29  brain]
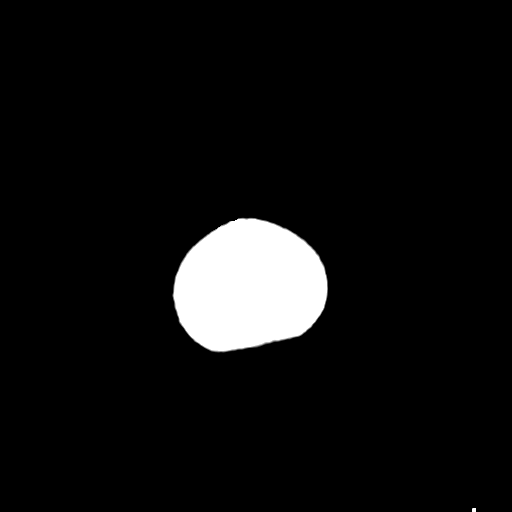

[Series 4: coronal soft tissue · coronal · 0.35mm/px · 3 of 66 slices shown]
[im 22/66  brain]
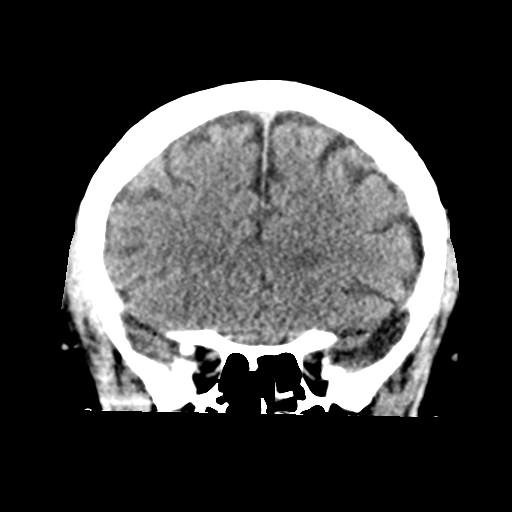
[im 29/66  brain]
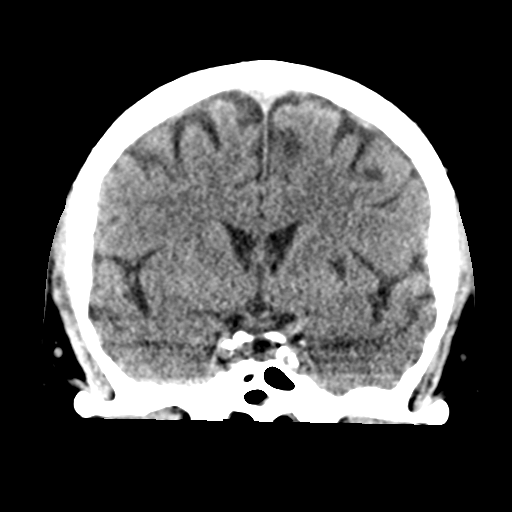
[im 37/66  brain]
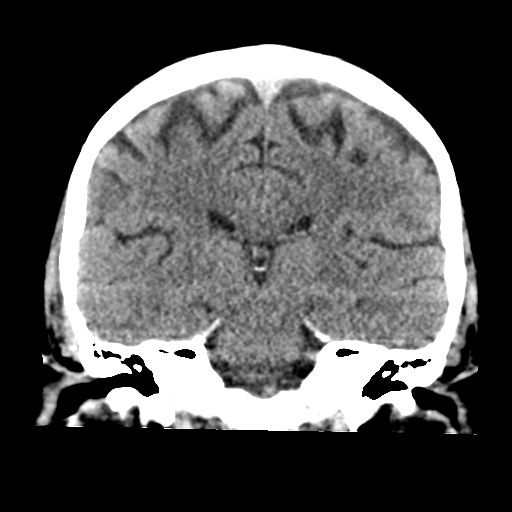

[Series 5: sagittal soft tissue · sagittal · 0.33mm/px · 3 of 67 slices shown]
[im 23/67  brain]
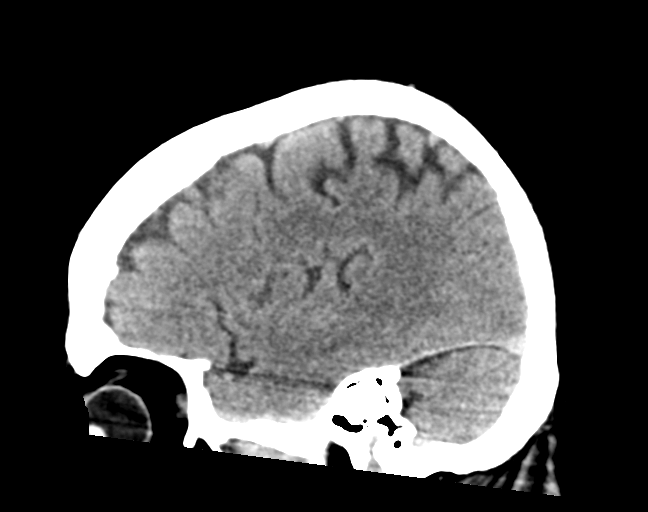
[im 34/67  brain]
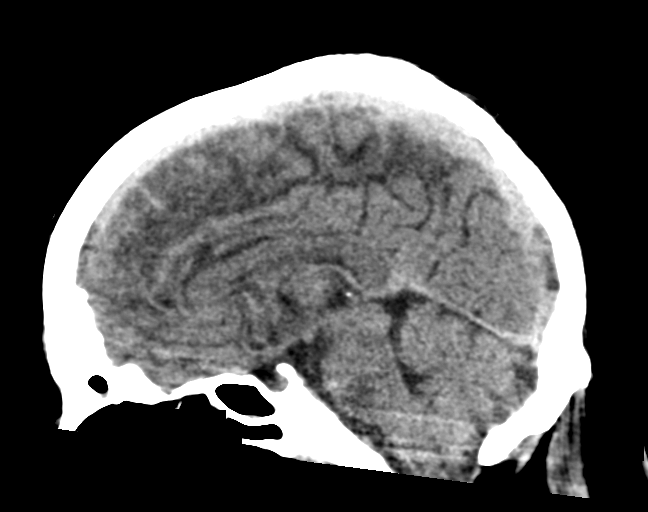
[im 45/67  brain]
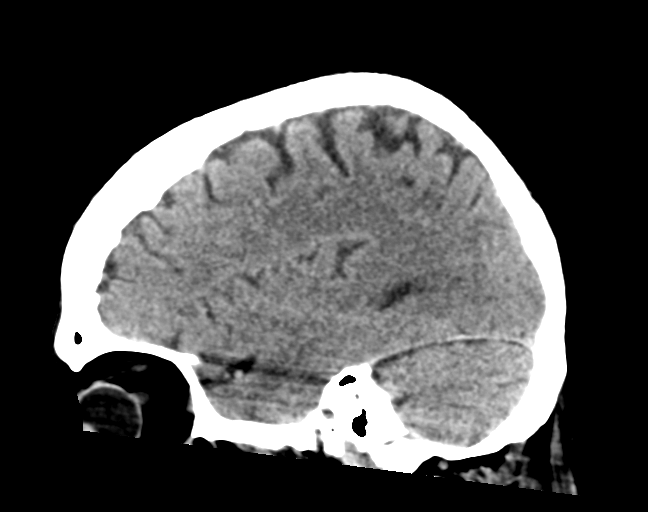

[16 of 46 positions shown; findings below may reference images not displayed]

FINDINGS: Brain: Hypodensity right thalamus compatible infarct of
indeterminate age. Hypodensity left lateral basal ganglia compatible
with infarct which is likely chronic.

Negative for hemorrhage or mass.  Ventricle size normal.

Vascular: Negative for hyperdense vessel

Skull: Negative

Sinuses/Orbits: Mild mucosal edema paranasal sinuses.

Other: None
IMPRESSION: Infarct right thalamus could be subacute or chronic. Infarct left
lateral basal ganglia probably chronic. Negative for hemorrhage.

## 2020-07-06 ENCOUNTER — Other Ambulatory Visit: Payer: Self-pay | Admitting: "Endocrinology

## 2020-07-23 DIAGNOSIS — Z794 Long term (current) use of insulin: Secondary | ICD-10-CM | POA: Diagnosis not present

## 2020-07-23 DIAGNOSIS — N183 Chronic kidney disease, stage 3 unspecified: Secondary | ICD-10-CM | POA: Diagnosis not present

## 2020-07-23 DIAGNOSIS — E1122 Type 2 diabetes mellitus with diabetic chronic kidney disease: Secondary | ICD-10-CM | POA: Diagnosis not present

## 2020-07-24 LAB — COMPREHENSIVE METABOLIC PANEL
ALT: 31 IU/L (ref 0–44)
AST: 32 IU/L (ref 0–40)
Albumin/Globulin Ratio: 1.3 (ref 1.2–2.2)
Albumin: 4.1 g/dL (ref 3.8–4.8)
Alkaline Phosphatase: 40 IU/L — ABNORMAL LOW (ref 44–121)
BUN/Creatinine Ratio: 13 (ref 10–24)
BUN: 16 mg/dL (ref 8–27)
Bilirubin Total: 0.7 mg/dL (ref 0.0–1.2)
CO2: 23 mmol/L (ref 20–29)
Calcium: 9.9 mg/dL (ref 8.6–10.2)
Chloride: 99 mmol/L (ref 96–106)
Creatinine, Ser: 1.28 mg/dL — ABNORMAL HIGH (ref 0.76–1.27)
Globulin, Total: 3.1 g/dL (ref 1.5–4.5)
Glucose: 140 mg/dL — ABNORMAL HIGH (ref 65–99)
Potassium: 4.8 mmol/L (ref 3.5–5.2)
Sodium: 138 mmol/L (ref 134–144)
Total Protein: 7.2 g/dL (ref 6.0–8.5)
eGFR: 61 mL/min/{1.73_m2} (ref >59)

## 2020-08-07 ENCOUNTER — Encounter: Payer: Self-pay | Admitting: Nurse Practitioner

## 2020-08-07 ENCOUNTER — Ambulatory Visit: Payer: PPO | Admitting: Nurse Practitioner

## 2020-08-07 ENCOUNTER — Other Ambulatory Visit: Payer: Self-pay

## 2020-08-07 VITALS — BP 174/91 | HR 75 | Ht 71.0 in | Wt 236.8 lb

## 2020-08-07 DIAGNOSIS — E1122 Type 2 diabetes mellitus with diabetic chronic kidney disease: Secondary | ICD-10-CM

## 2020-08-07 DIAGNOSIS — E559 Vitamin D deficiency, unspecified: Secondary | ICD-10-CM

## 2020-08-07 DIAGNOSIS — E1165 Type 2 diabetes mellitus with hyperglycemia: Secondary | ICD-10-CM | POA: Diagnosis not present

## 2020-08-07 DIAGNOSIS — E782 Mixed hyperlipidemia: Secondary | ICD-10-CM

## 2020-08-07 DIAGNOSIS — I10 Essential (primary) hypertension: Secondary | ICD-10-CM

## 2020-08-07 DIAGNOSIS — N182 Chronic kidney disease, stage 2 (mild): Secondary | ICD-10-CM | POA: Diagnosis not present

## 2020-08-07 DIAGNOSIS — Z794 Long term (current) use of insulin: Secondary | ICD-10-CM

## 2020-08-07 LAB — POCT GLYCOSYLATED HEMOGLOBIN (HGB A1C): HbA1c, POC (controlled diabetic range): 7.9 % — AB (ref 0.0–7.0)

## 2020-08-07 MED ORDER — TRESIBA FLEXTOUCH 100 UNIT/ML ~~LOC~~ SOPN
55.0000 [IU] | PEN_INJECTOR | Freq: Every day | SUBCUTANEOUS | 3 refills | Status: DC
Start: 2020-08-07 — End: 2021-04-26

## 2020-08-07 NOTE — Patient Instructions (Signed)

## 2020-08-07 NOTE — Progress Notes (Signed)
08/07/2020    Endocrinology follow-up note   Subjective:    Patient ID: Javier Walker, male    DOB: 01-30-1951.  He is being seen in follow-up  of management of diabetes, Hypertension, hyperlipidemia.   Past Medical History:  Diagnosis Date  . Diabetes mellitus without complication ([place])   . GERD (gastroesophageal reflux disease)    occ-no meds  . Hives    patient has hives with anxiety  . Hyperlipemia   . Hypertension   . Shortness of breath    Past Surgical History:  Procedure Laterality Date  . CHOLECYSTECTOMY N/A 08/26/2016   Procedure: CHOLECYSTECTOMY;  Surgeon: Aviva Signs, MD;  Location: [facility];  Service: General;  Laterality: N/A;  . COLON SURGERY  2011   gangrene lower colon with colostomy  . COLOSTOMY REVERSAL  2011  . ERCP N/A 08/17/2016   Procedure: ENDOSCOPIC RETROGRADE CHOLANGIOPANCREATOGRAPHY (ERCP) Biliary sphincterotomy and stone extraction;  Surgeon: Rogene Houston, MD;  Location: [facility];  Service: Endoscopy;  Laterality: N/A;  . OPEN REDUCTION INTERNAL FIXATION (ORIF) TIBIA/FIBULA FRACTURE Right 08/30/2012   Procedure: OPEN REDUCTION INTERNAL FIXATION (ORIF) TIBIA/FIBULA FRACTURE;  Surgeon: Wylene Simmer, MD;  Location: [place];  Service: Orthopedics;  Laterality: Right;  . WOUND DEBRIDEMENT  2011   abd-   Social History   Socioeconomic History  . Marital status: Married    Spouse name: Not on file  . Number of children: Not on file  . Years of education: Not on file  . Highest education level: Not on file  Occupational History  . Not on file  Tobacco Use  . Smoking status: Former Smoker    Packs/day: 2.00    Years: 15.00    Pack years: 30.00    Types: Cigarettes    Quit date: 08/27/1988    Years since quitting: 31.9  . Smokeless tobacco: Never Used  Vaping Use  . Vaping Use: Never used  Substance and Sexual Activity  . Alcohol use: Yes    Comment: rarely  . Drug use: No  . Sexual activity: Yes    Birth  control/protection: None  Other Topics Concern  . Not on file  Social History Narrative  . Not on file   Social Determinants of Health   Financial Resource Strain: Not on file  Food Insecurity: Not on file  Transportation Needs: Not on file  Physical Activity: Not on file  Stress: Not on file  Social Connections: Not on file   Outpatient Encounter Medications as of 08/07/2020  Medication Sig  . acetaminophen (TYLENOL) 325 MG tablet Take 2 tablets (650 mg total) by mouth every 6 (six) hours as needed for mild pain, fever or headache (or Fever >/= 101).  [name] atorvastatin (LIPITOR) 40 MG tablet Take 1 tablet (40 mg total) by mouth every evening.  . Cholecalciferol (VITAMIN D3) 125 MCG (5000 UT) CAPS Take 1 capsule (5,000 Units total) by mouth daily.  . clopidogrel (PLAVIX) 75 MG tablet Take 1 tablet (75 mg total) by mouth daily. Take with aspirin 81 mg daily until seen by neurologist  . fenofibrate (TRICOR) 145 MG tablet Take 1 tablet by mouth once daily  . glipiZIDE (GLUCOTROL XL) 5 MG 24 hr tablet Take 1 tablet by mouth once daily with breakfast  . glucose blood (ONETOUCH VERIO) test strip Use as instructed to monitor glucose twice daily, before breakfast and before bed.  . Insulin Pen Needle (B-D ULTRAFINE III SHORT PEN) 31G X 8 MM MISC 1  each by Other route daily. Use to inject insulin daily.  [name] lisinopril (ZESTRIL) 20 MG tablet Take 1 tablet (20 mg total) by mouth daily.  . metFORMIN (GLUCOPHAGE) 500 MG tablet Take 1 tablet (500 mg total) by mouth daily with breakfast.  . Omega-3 Fatty Acids (FISH OIL) 1200 MG CAPS Take 2,400 mg by mouth daily.  . TRESIBA FLEXTOUCH 100 UNIT/ML FlexTouch Pen INJECT 50 UNITS SUBCUTANEOUSLY ONCE DAILY AT BEDTIME   No facility-administered encounter medications on file as of 08/07/2020.   ALLERGIES: Allergies  Allergen Reactions  . Augmentin [Amoxicillin-Pot Clavulanate] Rash   VACCINATION STATUS: Immunization History  Administered Date(s)  Administered  . Influenza-Unspecified 02/03/2020    Diabetes He presents for his follow-up diabetic visit. He has type 2 diabetes mellitus. Onset time: He was diagnosed at approximate age of 70 years. His disease course has been worsening. There are no hypoglycemic associated symptoms. Pertinent negatives for hypoglycemia include no confusion, pallor or seizures. Pertinent negatives for diabetes include no fatigue, no polydipsia, no polyphagia, no polyuria and no weakness. There are no hypoglycemic complications. Symptoms are stable. Diabetic complications include nephropathy. Risk factors for coronary artery disease include diabetes mellitus, dyslipidemia, hypertension, male sex, obesity, tobacco exposure and sedentary lifestyle. Current diabetic treatment includes oral agent (dual therapy) and insulin injections. He is compliant with treatment most of the time. His weight is fluctuating minimally. He is following a generally unhealthy diet. When asked about meal planning, he reported none. He has not had a previous visit with a dietitian. He never participates in exercise. His home blood glucose trend is fluctuating minimally. His breakfast blood glucose range is generally 130-140 mg/dl. (He presents today with his meter and logs showing slightly above target fasting glycemic profile.  His POCT A1c today is 7.9%, increasing from previous visit of 6.9%.  He denies any s/s of hypoglycemia.  He has noticed an increase in his glucose readings since we cut back on his Metformin due to decline in kidney function.) An ACE inhibitor/angiotensin II receptor blocker is being taken. He does not see a podiatrist.Eye exam is current.  Hyperlipidemia This is a chronic problem. The current episode started more than 1 year ago. The problem is uncontrolled. Recent lipid tests were reviewed and are high. Exacerbating diseases include chronic renal disease, diabetes and obesity. Factors aggravating his hyperlipidemia include  fatty foods. Pertinent negatives include no myalgias. Current antihyperlipidemic treatment includes statins and fibric acid derivatives. The current treatment provides mild improvement of lipids. Compliance problems include adherence to diet and adherence to exercise.  Risk factors for coronary artery disease include diabetes mellitus, dyslipidemia, hypertension, male sex, obesity and a sedentary lifestyle.   Review of systems  Constitutional: + Minimally fluctuating body weight,  current Body mass index is 33.03 kg/m. , no fatigue, no subjective hyperthermia, no subjective hypothermia Eyes: no blurry vision, no xerophthalmia ENT: no sore throat, no nodules palpated in throat, no dysphagia/odynophagia, no hoarseness Cardiovascular: no chest pain, no shortness of breath, no palpitations, no leg swelling Respiratory: no cough, no shortness of breath Gastrointestinal: no nausea/vomiting/diarrhea Musculoskeletal: no muscle/joint aches Skin: no rashes, no hyperemia Neurological: no tremors, no numbness, no tingling, no dizziness Psychiatric: no depression, no anxiety   Objective:    BP (!) 174/91 (BP Location: Left Arm, Patient Position: Sitting)   Pulse 75   Ht _0  (1.803 m)   Wt 236 lb 12.8 oz (107.4 kg)   BMI 33.03 kg/m   Wt Readings from Last 3 Encounters:  08/07/20 236 lb 12.8 oz (107.4 kg)  04/08/20 235 lb (106.6 kg)  11/21/19 236 lb 6.4 oz (107.2 kg)    BP Readings from Last 3 Encounters:  08/07/20 (!) 174/91  04/08/20 (!) 141/75  11/21/19 136/68     Physical Exam- Limited  Constitutional:  Body mass index is 33.03 kg/m. , not in acute distress, normal state of mind Eyes:  EOMI, no exophthalmos Neck: Supple Cardiovascular: RRR, no murmers, rubs, or gallops, no edema Respiratory: Adequate breathing efforts, no crackles, rales, rhonchi, or wheezing Musculoskeletal: no gross deformities, strength intact in all four extremities, no gross restriction of joint  movements Skin:  no rashes, no hyperemia Neurological: no tremor with outstretched hands    Lipid Panel     Component Value Date/Time   CHOL 208 (H) 03/04/2020 0709   TRIG 401 (H) 03/04/2020 0709   HDL 20 (L) 03/04/2020 0709   CHOLHDL 10.4 (H) 03/04/2020 0709   VLDL 54 (H) 06/03/2018 0930   LDLCALC  03/04/2020 0709     Comment:     . LDL cholesterol not calculated. Triglyceride levels greater than 400 mg/dL invalidate calculated LDL results. . Reference range: <100 . Desirable range <100 mg/dL for primary prevention;   <70 mg/dL for patients with CHD or diabetic patients  with > or = 2 CHD risk factors. [name] LDL-C is now calculated using the Martin-Hopkins  calculation, which is a validated novel method providing  better accuracy than the Friedewald equation in the  estimation of LDL-C.  Cresenciano Genre et al. Annamaria Helling. 6761;950(93): 2061-2068  (http://education.[facility].com/faq/FAQ164)    Recent Results (from the past 2160 hour(s))  Comprehensive metabolic panel     Status: Abnormal   Collection Time: 07/23/20  8:01 AM  Result Value Ref Range   Glucose 140 (H) 65 - 99 mg/dL   BUN 16 8 - 27 mg/dL   Creatinine, Ser 1.28 (H) 0.76 - 1.27 mg/dL   eGFR 61 >59 mL/min/1.73   BUN/Creatinine Ratio 13 10 - 24   Sodium 138 134 - 144 mmol/L   Potassium 4.8 3.5 - 5.2 mmol/L   Chloride 99 96 - 106 mmol/L   CO2 23 20 - 29 mmol/L   Calcium 9.9 8.6 - 10.2 mg/dL   Total Protein 7.2 6.0 - 8.5 g/dL   Albumin 4.1 3.8 - 4.8 g/dL   Globulin, Total 3.1 1.5 - 4.5 g/dL   Albumin/Globulin Ratio 1.3 1.2 - 2.2   Bilirubin Total 0.7 0.0 - 1.2 mg/dL   Alkaline Phosphatase 40 (L) 44 - 121 IU/L   AST 32 0 - 40 IU/L   ALT 31 0 - 44 IU/L    CMP Latest Ref Rng & Units 07/23/2020 03/04/2020 07/29/2019  Glucose 65 - 99 mg/dL 140(H) 131(H) 124(H)  BUN 8 - 27 mg/dL _0 Creatinine 0.76 - 1.27 mg/dL 1.28(H) 1.33(H) 1.27(H)  Sodium 134 - 144 mmol/L 138 139 137  Potassium 3.5 - 5.2 mmol/L 4.8 4.6  4.5  Chloride 96 - 106 mmol/L 99 105 103  CO2 20 - 29 mmol/L _1 Calcium 8.6 - 10.2 mg/dL 9.9 9.8 9.8  Total Protein 6.0 - 8.5 g/dL 7.2 7.2 6.8  Total Bilirubin 0.0 - 1.2 mg/dL 0.7 0.4 0.7  Alkaline Phos 44 - 121 IU/L 40(L) - -  AST 0 - 40 IU/L 32 27 30  ALT 0 - 44 IU/L _2 Assessment & Plan:   1) Uncontrolled type 2 diabetes  mellitus with CKD  - Patient has currently uncontrolled symptomatic type 2 DM since  70 years of age.  He presents today with his meter and logs showing slightly above target fasting glycemic profile.  His POCT A1c today is 7.9%, increasing from previous visit of 6.9%.  He denies any s/s of hypoglycemia.  He has noticed an increase in his glucose readings since we cut back on his Metformin due to decline in kidney function.  - His diabetes is complicated by worsening CKD, obesity/sedentary life and patient remains at a high risk for more acute and chronic complications of diabetes which include CAD, CVA, CKD, retinopathy, and neuropathy. These are all discussed in detail with the patient.  - Nutritional counseling repeated at each appointment due to patients tendency to fall back in to old habits.  - The patient admits there is a room for improvement in their diet and drink choices. -  Suggestion is made for the patient to avoid simple carbohydrates from their diet including Cakes, Sweet Desserts / Pastries, Ice Cream, Soda (diet and regular), Sweet Tea, Candies, Chips, Cookies, Sweet Pastries,  Store Bought Juices, Alcohol in Excess of  1-2 drinks a day, Artificial Sweeteners, Coffee Creamer, and "Sugar-free" Products. This will help patient to have stable blood glucose profile and potentially avoid unintended weight gain.   - I encouraged the patient to switch to  unprocessed or minimally processed complex starch and increased protein intake (animal or plant source), fruits, and vegetables.   - Patient is advised to stick to a routine mealtimes to  eat 3 meals  a day and avoid unnecessary snacks ( to snack only to correct hypoglycemia).  - I have approached patient with the following individualized plan to manage diabetes and patient agrees:   -Based on his presentation with slightly above target glycemic profile both fasting and postprandial, he will not require prandial insulin for now.  -He will, however, tolerate increase in his [place] to 55 units nightly.  He can continue Metformin 500 mg po daily with breakfast and Glipizide 5 mg XL daily with breakfast.  (his kidney function as improved since last visit where we decreased his Metformin).  -He is encouraged to continue monitoring blood glucose twice daily, before meals and before bed, and to call the clinic if he has readings less than 70 or greater than 200 for 3 tests in a row.  2) Lipids/HPL:  His most recent lipid panel from 03/04/20 shows uncontrolled LDL (not calculated due to elevated triglycerides) and elevated triglycerides of 401.  He is advised to continue Lipitor 40 mg po daily at bedtime, Fenofibrate 145 mg po daily and Fish Oil supplement 2 g po twice daily.  He is also advised to avoid fried foods and butter, when possible.  3) Hypertension:  His blood pressure is not controlled to target.  He is encouraged to continue his medications including Lisinopril 20 mg p.o. daily.  He will be considered for HCTZ next visit if blood pressure remains above 130/80.   4) Vitamin D deficiency -He is on ongoing supplement with vitamin D3, ongoing supplement with vitamin D3, daily.  He is advised to continue with vitamin D3 supplement 5000 units daily as maintenance.  5) Weight management:  His Body mass index is 33.03 kg/m.--clearly complicating his diabetes management  He is a candidate for modest weight loss.  Carbs and exercise regimen discussed with him.  He is advised to maintain close follow-up with his PMD.  I spent 32 minutes in the care of the patient today  including review of labs from [facility], Lipids, Thyroid Function, Hematology (current and previous including abstractions from other facilities); face-to-face time discussing  his blood glucose readings/logs, discussing hypoglycemia and hyperglycemia episodes and symptoms, medications doses, his options of short and long term treatment based on the latest standards of care / guidelines;  discussion about incorporating lifestyle medicine;  and documenting the encounter.    Please refer to Patient Instructions for Blood Glucose Monitoring and Insulin/Medications Dosing Guide"  in media tab for additional information. Please  also refer to " Patient Self Inventory" in the Media  tab for reviewed elements of pertinent patient history.  [name] participated in the discussions, expressed understanding, and voiced agreement with the above plans.  All questions were answered to his satisfaction. he is encouraged to contact clinic should he have any questions or concerns prior to his return visit.    Follow up plan: - No follow-ups on file.  Rayetta Pigg, [facility] [address] [place], [place] [address] Phone: [phone] Fax: [phone]  08/07/2020, 10:35 AM

## 2020-08-17 ENCOUNTER — Other Ambulatory Visit: Payer: Self-pay | Admitting: "Endocrinology

## 2020-08-17 DIAGNOSIS — Z794 Long term (current) use of insulin: Secondary | ICD-10-CM

## 2020-08-17 DIAGNOSIS — N183 Chronic kidney disease, stage 3 unspecified: Secondary | ICD-10-CM

## 2020-09-09 ENCOUNTER — Other Ambulatory Visit: Payer: Self-pay | Admitting: "Endocrinology

## 2020-10-02 ENCOUNTER — Other Ambulatory Visit: Payer: Self-pay | Admitting: Nurse Practitioner

## 2020-10-28 ENCOUNTER — Other Ambulatory Visit: Payer: Self-pay | Admitting: Nurse Practitioner

## 2020-10-28 DIAGNOSIS — E782 Mixed hyperlipidemia: Secondary | ICD-10-CM

## 2020-11-20 ENCOUNTER — Other Ambulatory Visit: Payer: Self-pay | Admitting: Nurse Practitioner

## 2020-11-20 DIAGNOSIS — N183 Chronic kidney disease, stage 3 unspecified: Secondary | ICD-10-CM

## 2020-11-20 DIAGNOSIS — E1122 Type 2 diabetes mellitus with diabetic chronic kidney disease: Secondary | ICD-10-CM

## 2020-12-08 ENCOUNTER — Ambulatory Visit: Payer: PPO | Admitting: Nurse Practitioner

## 2020-12-23 ENCOUNTER — Other Ambulatory Visit: Payer: Self-pay

## 2020-12-23 ENCOUNTER — Ambulatory Visit: Payer: PPO | Admitting: Nurse Practitioner

## 2020-12-23 ENCOUNTER — Encounter: Payer: Self-pay | Admitting: Nurse Practitioner

## 2020-12-23 VITALS — BP 135/71 | HR 75 | Ht 71.0 in | Wt 234.4 lb

## 2020-12-23 DIAGNOSIS — E1122 Type 2 diabetes mellitus with diabetic chronic kidney disease: Secondary | ICD-10-CM

## 2020-12-23 DIAGNOSIS — Z794 Long term (current) use of insulin: Secondary | ICD-10-CM

## 2020-12-23 DIAGNOSIS — E559 Vitamin D deficiency, unspecified: Secondary | ICD-10-CM | POA: Diagnosis not present

## 2020-12-23 DIAGNOSIS — I10 Essential (primary) hypertension: Secondary | ICD-10-CM | POA: Diagnosis not present

## 2020-12-23 DIAGNOSIS — N182 Chronic kidney disease, stage 2 (mild): Secondary | ICD-10-CM | POA: Diagnosis not present

## 2020-12-23 DIAGNOSIS — E782 Mixed hyperlipidemia: Secondary | ICD-10-CM | POA: Diagnosis not present

## 2020-12-23 LAB — POCT GLYCOSYLATED HEMOGLOBIN (HGB A1C): Hemoglobin A1C: 7.9 % — AB (ref 4.0–5.6)

## 2020-12-23 NOTE — Progress Notes (Signed)
12/23/2020    Endocrinology follow-up note   Subjective:    Patient ID: Javier Walker, male    DOB: Oct 16, 1950.  He is being seen in follow-up  of management of diabetes, Hypertension, hyperlipidemia.   Past Medical History:  Diagnosis Date   Diabetes mellitus without complication (HCC)    GERD (gastroesophageal reflux disease)    occ-no meds   Hives    patient has hives with anxiety   Hyperlipemia    Hypertension    Shortness of breath    Past Surgical History:  Procedure Laterality Date   CHOLECYSTECTOMY N/A 08/26/2016   Procedure: CHOLECYSTECTOMY;  Surgeon: Franky Macho, MD;  Location: [facility];  Service: General;  Laterality: N/A;   COLON SURGERY  2011   gangrene lower colon with colostomy   COLOSTOMY REVERSAL  2011   ERCP N/A 08/17/2016   Procedure: ENDOSCOPIC RETROGRADE CHOLANGIOPANCREATOGRAPHY (ERCP) Biliary sphincterotomy and stone extraction;  Surgeon: Malissa Hippo, MD;  Location: [facility];  Service: Endoscopy;  Laterality: N/A;   OPEN REDUCTION INTERNAL FIXATION (ORIF) TIBIA/FIBULA FRACTURE Right 08/30/2012   Procedure: OPEN REDUCTION INTERNAL FIXATION (ORIF) TIBIA/FIBULA FRACTURE;  Surgeon: Toni Arthurs, MD;  Location: [facility];  Service: Orthopedics;  Laterality: Right;   WOUND DEBRIDEMENT  2011   abd-   Social History   Socioeconomic History   Marital status: Married    Spouse name: Not on file   Number of children: Not on file   Years of education: Not on file   Highest education level: Not on file  Occupational History   Not on file  Tobacco Use   Smoking status: Former    Packs/day: 2.00    Years: 15.00    Pack years: 30.00    Types: Cigarettes    Quit date: 08/27/1988    Years since quitting: 32.3   Smokeless tobacco: Never  Vaping Use   Vaping Use: Never used  Substance and Sexual Activity   Alcohol use: Yes    Comment: rarely   Drug use: No   Sexual activity: Yes    Birth control/protection: None  Other Topics  Concern   Not on file  Social History Narrative   Not on file   Social Determinants of Health   Financial Resource Strain: Not on file  Food Insecurity: Not on file  Transportation Needs: Not on file  Physical Activity: Not on file  Stress: Not on file  Social Connections: Not on file   Outpatient Encounter Medications as of 12/23/2020  Medication Sig   acetaminophen (TYLENOL) 325 MG tablet Take 2 tablets (650 mg total) by mouth every 6 (six) hours as needed for mild pain, fever or headache (or Fever >/= 101).   atorvastatin (LIPITOR) 40 MG tablet Take 1 tablet (40 mg total) by mouth every evening.   B-D ULTRAFINE III SHORT PEN 31G X 8 MM MISC USE 1 EACH DAILY. USE TO INJECT INSULIN DAILY.   Cholecalciferol (VITAMIN D3) 125 MCG (5000 UT) CAPS Take 1 capsule (5,000 Units total) by mouth daily.   clopidogrel (PLAVIX) 75 MG tablet Take 1 tablet (75 mg total) by mouth daily. Take with aspirin 81 mg daily until seen by neurologist   fenofibrate (TRICOR) 145 MG tablet Take 1 tablet by mouth once daily   glipiZIDE (GLUCOTROL XL) 5 MG 24 hr tablet Take 1 tablet by mouth once daily with breakfast   glucose blood (ONETOUCH VERIO) test strip Use as instructed to monitor glucose twice daily, before breakfast  and before bed.   insulin degludec (TRESIBA FLEXTOUCH) 100 UNIT/ML FlexTouch Pen Inject 55 Units into the skin at bedtime.   lisinopril (ZESTRIL) 10 MG tablet Take 1 tablet by mouth daily.   metFORMIN (GLUCOPHAGE) 500 MG tablet Take 1 tablet by mouth once daily with breakfast   Omega-3 Fatty Acids (FISH OIL) 1200 MG CAPS Take 2,400 mg by mouth daily.   [DISCONTINUED] lisinopril (ZESTRIL) 20 MG tablet Take 1 tablet (20 mg total) by mouth daily. (Patient not taking: Reported on 12/23/2020)   [DISCONTINUED] lisinopril (ZESTRIL) 5 MG tablet Take 5 mg by mouth daily. (Patient not taking: Reported on 12/23/2020)   [DISCONTINUED] pioglitazone (ACTOS) 30 MG tablet Take 1 tablet by mouth daily. (Patient  not taking: Reported on 12/23/2020)   No facility-administered encounter medications on file as of 12/23/2020.   ALLERGIES: Allergies  Allergen Reactions   Augmentin [Amoxicillin-Pot Clavulanate] Rash   VACCINATION STATUS: Immunization History  Administered Date(s) Administered   Influenza-Unspecified 02/03/2020    Diabetes He presents for his follow-up diabetic visit. He has type 2 diabetes mellitus. Onset time: He was diagnosed at approximate age of 10 years. His disease course has been stable. There are no hypoglycemic associated symptoms. Pertinent negatives for hypoglycemia include no confusion, pallor or seizures. Pertinent negatives for diabetes include no fatigue, no polydipsia, no polyphagia, no polyuria and no weakness. There are no hypoglycemic complications. Symptoms are stable. Diabetic complications include nephropathy. Risk factors for coronary artery disease include diabetes mellitus, dyslipidemia, hypertension, male sex, obesity, tobacco exposure and sedentary lifestyle. Current diabetic treatment includes oral agent (dual therapy) and insulin injections. He is compliant with treatment most of the time. His weight is fluctuating minimally. He is following a generally unhealthy diet. When asked about meal planning, he reported none. He has not had a previous visit with a dietitian. He never participates in exercise. His home blood glucose trend is fluctuating minimally. His breakfast blood glucose range is generally 130-140 mg/dl. His overall blood glucose range is 130-140 mg/dl. (He presents today with his logs, no meter, showing stable glycemic profile.  His POCT A1c today is 7.9%, unchanged from previous visit.  He travels quite often which makes eating healthy challenging at times due to available food options.  He denies any hypoglycemia.) An ACE inhibitor/angiotensin II receptor blocker is being taken. He does not see a podiatrist.Eye exam is current.  Hyperlipidemia This is a  chronic problem. The current episode started more than 1 year ago. The problem is uncontrolled. Recent lipid tests were reviewed and are high. Exacerbating diseases include chronic renal disease, diabetes and obesity. Factors aggravating his hyperlipidemia include fatty foods. Pertinent negatives include no myalgias. Current antihyperlipidemic treatment includes statins and fibric acid derivatives. The current treatment provides mild improvement of lipids. Compliance problems include adherence to diet and adherence to exercise.  Risk factors for coronary artery disease include diabetes mellitus, dyslipidemia, hypertension, male sex, obesity and a sedentary lifestyle.  Hypertension This is a chronic problem. The current episode started more than 1 year ago. The problem has been gradually improving since onset. The problem is controlled. There are no associated agents to hypertension. Risk factors for coronary artery disease include diabetes mellitus, dyslipidemia, family history, obesity, male gender and sedentary lifestyle. Past treatments include ACE inhibitors. The current treatment provides moderate improvement. Compliance problems include diet.  Hypertensive end-organ damage includes kidney disease. Identifiable causes of hypertension include chronic renal disease.   Review of systems  Constitutional: + Minimally fluctuating body weight,  current Body mass index is 32.69 kg/m. , no fatigue, no subjective hyperthermia, no subjective hypothermia Eyes: no blurry vision, no xerophthalmia ENT: no sore throat, no nodules palpated in throat, no dysphagia/odynophagia, no hoarseness Cardiovascular: no chest pain, no shortness of breath, no palpitations, no leg swelling Respiratory: no cough, no shortness of breath Gastrointestinal: no nausea/vomiting/diarrhea Musculoskeletal: no muscle/joint aches Skin: no rashes, no hyperemia Neurological: no tremors, no numbness, no tingling, no dizziness Psychiatric:  no depression, no anxiety   Objective:    BP 135/71   Pulse 75   Ht 5\' 11"  (1.803 m)   Wt 234 lb 6.4 oz (106.3 kg)   BMI 32.69 kg/m   Wt Readings from Last 3 Encounters:  12/23/20 234 lb 6.4 oz (106.3 kg)  08/07/20 236 lb 12.8 oz (107.4 kg)  04/08/20 235 lb (106.6 kg)    BP Readings from Last 3 Encounters:  12/23/20 135/71  08/07/20 (!) 174/91  04/08/20 (!) 141/75      Physical Exam- Limited  Constitutional:  Body mass index is 32.69 kg/m. , not in acute distress, normal state of mind Eyes:  EOMI, no exophthalmos Neck: Supple Cardiovascular: RRR, no murmurs, rubs, or gallops, no edema Respiratory: Adequate breathing efforts, no crackles, rales, rhonchi, or wheezing Musculoskeletal: no gross deformities, strength intact in all four extremities, no gross restriction of joint movements Skin:  no rashes, no hyperemia Neurological: no tremor with outstretched hands    Lipid Panel     Component Value Date/Time   CHOL 208 (H) 03/04/2020 0709   TRIG 401 (H) 03/04/2020 0709   HDL 20 (L) 03/04/2020 0709   CHOLHDL 10.4 (H) 03/04/2020 0709   VLDL 54 (H) 06/03/2018 0930   LDLCALC  03/04/2020 0709     Comment:     . LDL cholesterol not calculated. Triglyceride levels greater than 400 mg/dL invalidate calculated LDL results. . Reference range: <100 . Desirable range <100 mg/dL for primary prevention;   <70 mg/dL for patients with CHD or diabetic patients  with > or = 2 CHD risk factors. 03/06/2020 LDL-C is now calculated using the Martin-Hopkins  calculation, which is a validated novel method providing  better accuracy than the Friedewald equation in the  estimation of LDL-C.  [name] et al. Horald Pollen. Lenox Ahr): 2061-2068  (http://education.[facility].com/faq/FAQ164)    No results found for this or any previous visit (from the past 2160 hour(s)).   CMP Latest Ref Rng & Units 07/23/2020 03/04/2020 07/29/2019  Glucose 65 - 99 mg/dL 07/31/2019) 160(V) 371(G)  BUN 8 - 27  mg/dL 16 19 17   Creatinine 0.76 - 1.27 mg/dL 626(R) ) 4.85(I)  Sodium 134 - 144 mmol/L 138 139 137  Potassium 3.5 - 5.2 mmol/L 4.8 4.6 4.5  Chloride 96 - 106 mmol/L 99 105 103  CO2 20 - 29 mmol/L 23 26 26   Calcium 8.6 - 10.2 mg/dL 9.9 9.8 9.8  Total Protein 6.0 - 8.5 g/dL 7.2 7.2 6.8  Total Bilirubin 0.0 - 1.2 mg/dL 0.7 0.4 0.7  Alkaline Phos 44 - 121 IU/L 40(L) - -  AST 0 - 40 IU/L 32 27 30  ALT 0 - 44 IU/L 31 25 31      Assessment & Plan:   1) Uncontrolled type 2 diabetes mellitus with CKD  - Patient has currently uncontrolled symptomatic type 2 DM since  70 years of age.  He presents today with his logs, no meter, showing stable glycemic profile.  His POCT A1c today is 7.9%, unchanged from previous visit.  He travels quite often which makes eating healthy challenging at times due to available food options.  He denies any hypoglycemia.  I do question the authenticity of his glucose logs due to frequency of repeating readings.  - His diabetes is complicated by worsening CKD, obesity/sedentary life and patient remains at a high risk for more acute and chronic complications of diabetes which include CAD, CVA, CKD, retinopathy, and neuropathy. These are all discussed in detail with the patient.  - Nutritional counseling repeated at each appointment due to patients tendency to fall back in to old habits.  - The patient admits there is a room for improvement in their diet and drink choices. -  Suggestion is made for the patient to avoid simple carbohydrates from their diet including Cakes, Sweet Desserts / Pastries, Ice Cream, Soda (diet and regular), Sweet Tea, Candies, Chips, Cookies, Sweet Pastries, Store Bought Juices, Alcohol in Excess of 1-2 drinks a day, Artificial Sweeteners, Coffee Creamer, and "Sugar-free" Products. This will help patient to have stable blood glucose profile and potentially avoid unintended weight gain.   - I encouraged the patient to switch to unprocessed  or minimally processed complex starch and increased protein intake (animal or plant source), fruits, and vegetables.   - Patient is advised to stick to a routine mealtimes to eat 3 meals a day and avoid unnecessary snacks (to snack only to correct hypoglycemia).  - I have approached patient with the following individualized plan to manage diabetes and patient agrees:   -Given his stable, near target glycemic profile, no changes will be made to his medications today.  He is advised to continue Tresiba 55 units SQ nightly, Metformin 500 mg po daily with breakfast and Glipizide 5 mg XL daily with breakfast.   -He is encouraged to continue monitoring blood glucose twice daily, before meals and before bed, and to call the clinic if he has readings less than 70 or greater than 200 for 3 tests in a row.  2) Lipids/HPL:  His most recent lipid panel from 03/04/20 shows uncontrolled LDL (not calculated due to elevated triglycerides) and elevated triglycerides of 401.  He is advised to continue Lipitor 40 mg po daily at bedtime, Fenofibrate 145 mg po daily and Fish Oil supplement 2 g po twice daily.  He is also advised to avoid fried foods and butter, when possible. Will recheck lipid panel prior to next visit.  3) Hypertension:  His blood pressure is controlled to target.  He is encouraged to continue his medications including Lisinopril 10 mg p.o. daily.   4) Vitamin D deficiency -His most recent Vitamin D level was 47 on 07/28/20.  He is advised to continue with vitamin D3 supplement 5000 units daily as maintenance.  5) Weight management:  His Body mass index is 32.69 kg/m.--clearly complicating his diabetes management  He is a candidate for modest weight loss.  Carbs and exercise regimen discussed with him.  He is advised to maintain close follow-up with his PMD on routine basis.       I spent 40 minutes in the care of the patient today including review of labs from CMP, Lipids, Thyroid  Function, Hematology (current and previous including abstractions from other facilities); face-to-face time discussing  his blood glucose readings/logs, discussing hypoglycemia and hyperglycemia episodes and symptoms, medications doses, his options of short and long term treatment based on the latest standards of care / guidelines;  discussion about incorporating lifestyle medicine;  and documenting the encounter.  Please refer to Patient Instructions for Blood Glucose Monitoring and Insulin/Medications Dosing Guide"  in media tab for additional information. Please  also refer to " Patient Self Inventory" in the Media  tab for reviewed elements of pertinent patient history.  [name] participated in the discussions, expressed understanding, and voiced agreement with the above plans.  All questions were answered to his satisfaction. he is encouraged to contact clinic should he have any questions or concerns prior to his return visit.    Follow up plan: - Return in about 4 months (around 04/24/2021) for Diabetes F/U with A1c in office, Previsit labs, Bring meter and logs.   Ronny Bacon, [facility] [address] [place], [place] [address] Phone: [phone] Fax: [phone]  12/23/2020, 9:53 AM

## 2020-12-23 NOTE — Patient Instructions (Signed)

## 2020-12-31 ENCOUNTER — Other Ambulatory Visit: Payer: Self-pay | Admitting: "Endocrinology

## 2021-01-28 ENCOUNTER — Other Ambulatory Visit: Payer: Self-pay | Admitting: Nurse Practitioner

## 2021-01-28 DIAGNOSIS — E782 Mixed hyperlipidemia: Secondary | ICD-10-CM

## 2021-02-22 ENCOUNTER — Other Ambulatory Visit: Payer: Self-pay | Admitting: Nurse Practitioner

## 2021-02-22 DIAGNOSIS — N183 Chronic kidney disease, stage 3 unspecified: Secondary | ICD-10-CM

## 2021-02-22 DIAGNOSIS — E1122 Type 2 diabetes mellitus with diabetic chronic kidney disease: Secondary | ICD-10-CM

## 2021-03-06 ENCOUNTER — Other Ambulatory Visit: Payer: Self-pay | Admitting: "Endocrinology

## 2021-04-05 ENCOUNTER — Other Ambulatory Visit: Payer: Self-pay | Admitting: Nurse Practitioner

## 2021-04-08 DIAGNOSIS — N182 Chronic kidney disease, stage 2 (mild): Secondary | ICD-10-CM | POA: Diagnosis not present

## 2021-04-08 DIAGNOSIS — E559 Vitamin D deficiency, unspecified: Secondary | ICD-10-CM | POA: Diagnosis not present

## 2021-04-08 DIAGNOSIS — E1122 Type 2 diabetes mellitus with diabetic chronic kidney disease: Secondary | ICD-10-CM | POA: Diagnosis not present

## 2021-04-08 DIAGNOSIS — Z794 Long term (current) use of insulin: Secondary | ICD-10-CM | POA: Diagnosis not present

## 2021-04-09 LAB — COMPREHENSIVE METABOLIC PANEL
ALT: 33 IU/L (ref 0–44)
AST: 41 IU/L — ABNORMAL HIGH (ref 0–40)
Albumin/Globulin Ratio: 1.3 (ref 1.2–2.2)
Albumin: 4.1 g/dL (ref 3.8–4.8)
Alkaline Phosphatase: 47 IU/L (ref 44–121)
BUN/Creatinine Ratio: 13 (ref 10–24)
BUN: 17 mg/dL (ref 8–27)
Bilirubin Total: 0.8 mg/dL (ref 0.0–1.2)
CO2: 25 mmol/L (ref 20–29)
Calcium: 9.9 mg/dL (ref 8.6–10.2)
Chloride: 99 mmol/L (ref 96–106)
Creatinine, Ser: 1.31 mg/dL — ABNORMAL HIGH (ref 0.76–1.27)
Globulin, Total: 3.2 g/dL (ref 1.5–4.5)
Glucose: 130 mg/dL — ABNORMAL HIGH (ref 70–99)
Potassium: 4.9 mmol/L (ref 3.5–5.2)
Sodium: 138 mmol/L (ref 134–144)
Total Protein: 7.3 g/dL (ref 6.0–8.5)
eGFR: 59 mL/min/{1.73_m2} — ABNORMAL LOW (ref >59)

## 2021-04-09 LAB — LIPID PANEL
Chol/HDL Ratio: 5.9 ratio — ABNORMAL HIGH (ref 0.0–5.0)
Cholesterol, Total: 123 mg/dL (ref 100–199)
HDL: 21 mg/dL — ABNORMAL LOW (ref >39)
LDL Chol Calc (NIH): 63 mg/dL (ref 0–99)
Triglycerides: 238 mg/dL — ABNORMAL HIGH (ref 0–149)
VLDL Cholesterol Cal: 39 mg/dL (ref 5–40)

## 2021-04-09 LAB — T4, FREE: Free T4: 1.27 ng/dL (ref 0.82–1.77)

## 2021-04-09 LAB — VITAMIN D 25 HYDROXY (VIT D DEFICIENCY, FRACTURES): Vit D, 25-Hydroxy: 31.9 ng/mL (ref 30.0–100.0)

## 2021-04-09 LAB — TSH: TSH: 4.37 u[IU]/mL (ref 0.450–4.500)

## 2021-04-19 DIAGNOSIS — Z6832 Body mass index (BMI) 32.0-32.9, adult: Secondary | ICD-10-CM | POA: Diagnosis not present

## 2021-04-19 DIAGNOSIS — Z1331 Encounter for screening for depression: Secondary | ICD-10-CM | POA: Diagnosis not present

## 2021-04-19 DIAGNOSIS — E782 Mixed hyperlipidemia: Secondary | ICD-10-CM | POA: Diagnosis not present

## 2021-04-19 DIAGNOSIS — Z0000 Encounter for general adult medical examination without abnormal findings: Secondary | ICD-10-CM | POA: Diagnosis not present

## 2021-04-19 DIAGNOSIS — E1129 Type 2 diabetes mellitus with other diabetic kidney complication: Secondary | ICD-10-CM | POA: Diagnosis not present

## 2021-04-19 DIAGNOSIS — E669 Obesity, unspecified: Secondary | ICD-10-CM | POA: Diagnosis not present

## 2021-04-19 DIAGNOSIS — I10 Essential (primary) hypertension: Secondary | ICD-10-CM | POA: Diagnosis not present

## 2021-04-19 DIAGNOSIS — E1140 Type 2 diabetes mellitus with diabetic neuropathy, unspecified: Secondary | ICD-10-CM | POA: Diagnosis not present

## 2021-04-19 LAB — HEMOGLOBIN A1C: Hemoglobin A1C: 7.9

## 2021-04-26 ENCOUNTER — Encounter: Payer: Self-pay | Admitting: Nurse Practitioner

## 2021-04-26 ENCOUNTER — Other Ambulatory Visit: Payer: Self-pay

## 2021-04-26 ENCOUNTER — Ambulatory Visit: Payer: PPO | Admitting: Nurse Practitioner

## 2021-04-26 VITALS — BP 157/69 | HR 66 | Ht 71.0 in | Wt 237.0 lb

## 2021-04-26 DIAGNOSIS — E782 Mixed hyperlipidemia: Secondary | ICD-10-CM | POA: Diagnosis not present

## 2021-04-26 DIAGNOSIS — N1831 Chronic kidney disease, stage 3a: Secondary | ICD-10-CM | POA: Diagnosis not present

## 2021-04-26 DIAGNOSIS — N182 Chronic kidney disease, stage 2 (mild): Secondary | ICD-10-CM

## 2021-04-26 DIAGNOSIS — I10 Essential (primary) hypertension: Secondary | ICD-10-CM | POA: Diagnosis not present

## 2021-04-26 DIAGNOSIS — E559 Vitamin D deficiency, unspecified: Secondary | ICD-10-CM

## 2021-04-26 DIAGNOSIS — Z794 Long term (current) use of insulin: Secondary | ICD-10-CM

## 2021-04-26 DIAGNOSIS — E1122 Type 2 diabetes mellitus with diabetic chronic kidney disease: Secondary | ICD-10-CM | POA: Diagnosis not present

## 2021-04-26 MED ORDER — GLIPIZIDE ER 5 MG PO TB24: 5.0000 mg | ORAL_TABLET | Freq: Every day | ORAL | 3 refills | Status: DC

## 2021-04-26 MED ORDER — TRESIBA FLEXTOUCH 100 UNIT/ML ~~LOC~~ SOPN: 55.0000 [IU] | PEN_INJECTOR | Freq: Every day | SUBCUTANEOUS | 3 refills | Status: DC

## 2021-04-26 MED ORDER — METFORMIN HCL 500 MG PO TABS: 500.0000 mg | ORAL_TABLET | Freq: Every day | ORAL | 3 refills | Status: DC

## 2021-04-26 NOTE — Patient Instructions (Signed)

## 2021-04-26 NOTE — Progress Notes (Signed)
04/26/2021    Endocrinology follow-up note   Subjective:    Patient ID: Javier Walker, male    DOB: Jun 03, 1950.  He is being seen in follow-up  of management of diabetes, Hypertension, hyperlipidemia.   Past Medical History:  Diagnosis Date   Diabetes mellitus without complication ([place])    GERD (gastroesophageal reflux disease)    occ-no meds   Hives    patient has hives with anxiety   Hyperlipemia    Hypertension    Shortness of breath    Past Surgical History:  Procedure Laterality Date   CHOLECYSTECTOMY N/A 08/26/2016   Procedure: CHOLECYSTECTOMY;  Surgeon: Aviva Signs, MD;  Location: [facility];  Service: General;  Laterality: N/A;   COLON SURGERY  2011   gangrene lower colon with colostomy   COLOSTOMY REVERSAL  2011   ERCP N/A 08/17/2016   Procedure: ENDOSCOPIC RETROGRADE CHOLANGIOPANCREATOGRAPHY (ERCP) Biliary sphincterotomy and stone extraction;  Surgeon: Rogene Houston, MD;  Location: [facility];  Service: Endoscopy;  Laterality: N/A;   OPEN REDUCTION INTERNAL FIXATION (ORIF) TIBIA/FIBULA FRACTURE Right 08/30/2012   Procedure: OPEN REDUCTION INTERNAL FIXATION (ORIF) TIBIA/FIBULA FRACTURE;  Surgeon: Wylene Simmer, MD;  Location: [facility];  Service: Orthopedics;  Laterality: Right;   WOUND DEBRIDEMENT  2011   abd-   Social History   Socioeconomic History   Marital status: Married    Spouse name: Not on file   Number of children: Not on file   Years of education: Not on file   Highest education level: Not on file  Occupational History   Not on file  Tobacco Use   Smoking status: Former    Packs/day: 2.00    Years: 15.00    Pack years: 30.00    Types: Cigarettes    Quit date: 08/27/1988    Years since quitting: 32.6   Smokeless tobacco: Never  Vaping Use   Vaping Use: Never used  Substance and Sexual Activity   Alcohol use: Yes    Comment: rarely   Drug use: No   Sexual activity: Yes    Birth control/protection: None  Other Topics  Concern   Not on file  Social History Narrative   Not on file   Social Determinants of Health   Financial Resource Strain: Not on file  Food Insecurity: Not on file  Transportation Needs: Not on file  Physical Activity: Not on file  Stress: Not on file  Social Connections: Not on file   Outpatient Encounter Medications as of 04/26/2021  Medication Sig   acetaminophen (TYLENOL) 325 MG tablet Take 2 tablets (650 mg total) by mouth every 6 (six) hours as needed for mild pain, fever or headache (or Fever >/= 101).   aspirin EC 81 MG tablet Take 81 mg by mouth daily. Swallow whole.   atorvastatin (LIPITOR) 40 MG tablet Take 1 tablet (40 mg total) by mouth every evening.   B-D ULTRAFINE III SHORT PEN 31G X 8 MM MISC USE 1 EACH DAILY. USE TO INJECT INSULIN DAILY.   Cholecalciferol (VITAMIN D3) 125 MCG (5000 UT) CAPS Take 1 capsule (5,000 Units total) by mouth daily.   fenofibrate (TRICOR) 145 MG tablet Take 1 tablet by mouth once daily   glucose blood (ONETOUCH VERIO) test strip Use as instructed to monitor glucose twice daily, before breakfast and before bed.   lisinopril (ZESTRIL) 5 MG tablet Take 5 mg by mouth daily.   Omega-3 Fatty Acids (FISH OIL) 1200 MG CAPS Take 2,400 mg by  mouth daily.   [DISCONTINUED] glipiZIDE (GLUCOTROL XL) 5 MG 24 hr tablet Take 1 tablet by mouth once daily with breakfast   [DISCONTINUED] insulin degludec (TRESIBA FLEXTOUCH) 100 UNIT/ML FlexTouch Pen Inject 55 Units into the skin at bedtime.   [DISCONTINUED] metFORMIN (GLUCOPHAGE) 500 MG tablet Take 1 tablet by mouth once daily with breakfast   glipiZIDE (GLUCOTROL XL) 5 MG 24 hr tablet Take 1 tablet (5 mg total) by mouth daily with breakfast.   insulin degludec (TRESIBA FLEXTOUCH) 100 UNIT/ML FlexTouch Pen Inject 55 Units into the skin at bedtime.   metFORMIN (GLUCOPHAGE) 500 MG tablet Take 1 tablet (500 mg total) by mouth daily with breakfast.   [DISCONTINUED] clopidogrel (PLAVIX) 75 MG tablet Take 1 tablet (75  mg total) by mouth daily. Take with aspirin 81 mg daily until seen by neurologist (Patient not taking: Reported on 04/26/2021)   [DISCONTINUED] lisinopril (ZESTRIL) 10 MG tablet Take 1 tablet by mouth daily. (Patient not taking: Reported on 04/26/2021)   No facility-administered encounter medications on file as of 04/26/2021.   ALLERGIES: Allergies  Allergen Reactions   Augmentin [Amoxicillin-Pot Clavulanate] Rash   VACCINATION STATUS: Immunization History  Administered Date(s) Administered   Influenza-Unspecified 02/03/2020    Diabetes He presents for his follow-up diabetic visit. He has type 2 diabetes mellitus. Onset time: He was diagnosed at approximate age of 33 years. His disease course has been stable. There are no hypoglycemic associated symptoms. Pertinent negatives for hypoglycemia include no confusion, pallor or seizures. Pertinent negatives for diabetes include no fatigue, no polydipsia, no polyphagia, no polyuria and no weakness. There are no hypoglycemic complications. Symptoms are stable. Diabetic complications include nephropathy. Risk factors for coronary artery disease include diabetes mellitus, dyslipidemia, hypertension, male sex, obesity, tobacco exposure and sedentary lifestyle. Current diabetic treatment includes oral agent (dual therapy) and insulin injections. He is compliant with treatment most of the time. His weight is fluctuating minimally. He is following a generally healthy diet. Meal planning includes avoidance of concentrated sweets. He has not had a previous visit with a dietitian. He never participates in exercise. His home blood glucose trend is fluctuating minimally. His breakfast blood glucose range is generally 130-140 mg/dl. His overall blood glucose range is 130-140 mg/dl. (He presents today, accompanied by his wife, with his meter and logs showing at target fasting glycemic profile.  His previsit A1c done at his PCP office on 1/9 was 7.9%, stable.  He denies  any hypoglycemia.  Analysis of his meter shows 7-day average of 126, 14-day average of 133, 30-day average of 141, 90-day average of 132.) An ACE inhibitor/angiotensin II receptor blocker is being taken. He does not see a podiatrist.Eye exam is current.  Hyperlipidemia This is a chronic problem. The current episode started more than 1 year ago. The problem is uncontrolled. Recent lipid tests were reviewed and are high. Exacerbating diseases include chronic renal disease, diabetes and obesity. Factors aggravating his hyperlipidemia include fatty foods. Pertinent negatives include no myalgias. Current antihyperlipidemic treatment includes statins and fibric acid derivatives. The current treatment provides mild improvement of lipids. Compliance problems include adherence to diet and adherence to exercise.  Risk factors for coronary artery disease include diabetes mellitus, dyslipidemia, hypertension, male sex, obesity and a sedentary lifestyle.  Hypertension This is a chronic problem. The current episode started more than 1 year ago. The problem has been gradually improving since onset. The problem is controlled. There are no associated agents to hypertension. Risk factors for coronary artery disease include diabetes mellitus,  dyslipidemia, family history, obesity, male gender and sedentary lifestyle. Past treatments include ACE inhibitors. The current treatment provides moderate improvement. Compliance problems include diet.  Hypertensive end-organ damage includes kidney disease. Identifiable causes of hypertension include chronic renal disease.   Review of systems  Constitutional: + Minimally fluctuating body weight,  current Body mass index is 33.05 kg/m. , no fatigue, no subjective hyperthermia, no subjective hypothermia Eyes: no blurry vision, no xerophthalmia ENT: no sore throat, no nodules palpated in throat, no dysphagia/odynophagia, no hoarseness Cardiovascular: no chest pain, no shortness of  breath, no palpitations, no leg swelling Respiratory: no cough, no shortness of breath Gastrointestinal: no nausea/vomiting/diarrhea Musculoskeletal: no muscle/joint aches Skin: no rashes, no hyperemia Neurological: no tremors, no numbness, no tingling, no dizziness Psychiatric: no depression, no anxiety   Objective:    BP (!) 157/69    Pulse 66    Ht $R'5\' 11"'cr$  (1.803 m)    Wt 237 lb (107.5 kg)    SpO2 99%    BMI 33.05 kg/m   Wt Readings from Last 3 Encounters:  04/26/21 237 lb (107.5 kg)  12/23/20 234 lb 6.4 oz (106.3 kg)  08/07/20 236 lb 12.8 oz (107.4 kg)    BP Readings from Last 3 Encounters:  04/26/21 (!) 157/69  12/23/20 135/71  08/07/20 (!) 174/91     Physical Exam- Limited  Constitutional:  Body mass index is 33.05 kg/m. , not in acute distress, normal state of mind Eyes:  EOMI, no exophthalmos Neck: Supple Cardiovascular: RRR, no murmurs, rubs, or gallops, no edema Respiratory: Adequate breathing efforts, no crackles, rales, rhonchi, or wheezing Musculoskeletal: no gross deformities, strength intact in all four extremities, no gross restriction of joint movements Skin:  no rashes, no hyperemia Neurological: no tremor with outstretched hands   Diabetic Foot Exam - Simple   Simple Foot Form Diabetic Foot exam was performed with the following findings: Yes 04/26/2021  8:38 AM  Visual Inspection See comments: Yes Sensation Testing Intact to touch and monofilament testing bilaterally: Yes Pulse Check Posterior Tibialis and Dorsalis pulse intact bilaterally: Yes Comments Dry flaky skin, mild onychomycosis, toenails in need of trim     Lipid Panel     Component Value Date/Time   CHOL 123 04/08/2021 0828   TRIG 238 (H) 04/08/2021 0828   HDL 21 (L) 04/08/2021 0828   CHOLHDL 5.9 (H) 04/08/2021 0828   CHOLHDL 10.4 (H) 03/04/2020 0709   VLDL 54 (H) 06/03/2018 0930   LDLCALC 63 04/08/2021 0828   LDLCALC  03/04/2020 0709     Comment:     . LDL cholesterol not  calculated. Triglyceride levels greater than 400 mg/dL invalidate calculated LDL results. . Reference range: <100 . Desirable range <100 mg/dL for primary prevention;   <70 mg/dL for patients with CHD or diabetic patients  with > or = 2 CHD risk factors. [name] LDL-C is now calculated using the Martin-Hopkins  calculation, which is a validated novel method providing  better accuracy than the Friedewald equation in the  estimation of LDL-C.  Cresenciano Genre et al. Annamaria Helling. 7619;509(32): 2061-2068  (http://education.[facility].com/faq/FAQ164)    Recent Results (from the past 2160 hour(s))  Comprehensive metabolic panel     Status: Abnormal   Collection Time: 04/08/21  8:28 AM  Result Value Ref Range   Glucose 130 (H) 70 - 99 mg/dL   BUN 17 8 - 27 mg/dL   Creatinine, Ser 1.31 (H) 0.76 - 1.27 mg/dL   eGFR 59 (L) >59 mL/min/1.73   BUN/Creatinine  Ratio 13 10 - 24   Sodium 138 134 - 144 mmol/L   Potassium 4.9 3.5 - 5.2 mmol/L   Chloride 99 96 - 106 mmol/L   CO2 25 20 - 29 mmol/L   Calcium 9.9 8.6 - 10.2 mg/dL   Total Protein 7.3 6.0 - 8.5 g/dL   Albumin 4.1 3.8 - 4.8 g/dL   Globulin, Total 3.2 1.5 - 4.5 g/dL   Albumin/Globulin Ratio 1.3 1.2 - 2.2   Bilirubin Total 0.8 0.0 - 1.2 mg/dL   Alkaline Phosphatase 47 44 - 121 IU/L   AST 41 (H) 0 - 40 IU/L   ALT 33 0 - 44 IU/L  Lipid panel     Status: Abnormal   Collection Time: 04/08/21  8:28 AM  Result Value Ref Range   Cholesterol, Total 123 100 - 199 mg/dL   Triglycerides 238 (H) 0 - 149 mg/dL   HDL 21 (L) >39 mg/dL   VLDL Cholesterol Cal 39 5 - 40 mg/dL   LDL Chol Calc (NIH) 63 0 - 99 mg/dL   Chol/HDL Ratio 5.9 (H) 0.0 - 5.0 ratio    Comment:                                   T. Chol/HDL Ratio                                             Men  Women                               1/2 Avg.Risk  3.4    3.3                                   Avg.Risk  5.0    4.4                                2X Avg.Risk  9.6    7.1                                 3X Avg.Risk 23.4   11.0   TSH     Status: None   Collection Time: 04/08/21  8:28 AM  Result Value Ref Range   TSH 4.370 0.450 - 4.500 uIU/mL  T4, free     Status: None   Collection Time: 04/08/21  8:28 AM  Result Value Ref Range   Free T4 1.27 0.82 - 1.77 ng/dL  VITAMIN D 25 Hydroxy (Vit-D Deficiency, Fractures)     Status: None   Collection Time: 04/08/21  8:28 AM  Result Value Ref Range   Vit D, 25-Hydroxy 31.9 30.0 - 100.0 ng/mL    Comment: Vitamin D deficiency has been defined by the [place] practice guideline as a level of serum 25-OH vitamin D less than 20 ng/mL (1,2). The Endocrine Society went on to further define vitamin D insufficiency as a level between 21 and 29 ng/mL (2). 1. IOM (Institute of Medicine). 2010. Dietary reference    intakes  for calcium and D. [place]: The    [employer]. 2. Holick MF, Binkley [place], Bischoff-Ferrari HA, et al.    Evaluation, treatment, and prevention of vitamin D    deficiency: an Endocrine Society clinical practice    guideline. JCEM. 2011 Jul; 96(7):1911-30.   Hemoglobin A1c     Status: None   Collection Time: 04/19/21 12:00 AM  Result Value Ref Range   Hemoglobin A1C 7.9      CMP Latest Ref Rng & Units 04/08/2021 07/23/2020 03/04/2020  Glucose 70 - 99 mg/dL 130(H) 140(H) 131(H)  BUN 8 - 27 mg/dL _0 Creatinine 0.76 - 1.27 mg/dL 1.31(H) 1.28(H) 1.33(H)  Sodium 134 - 144 mmol/L 138 138 139  Potassium 3.5 - 5.2 mmol/L 4.9 4.8 4.6  Chloride 96 - 106 mmol/L 99 99 105  CO2 20 - 29 mmol/L _1 Calcium 8.6 - 10.2 mg/dL 9.9 9.9 9.8  Total Protein 6.0 - 8.5 g/dL 7.3 7.2 7.2  Total Bilirubin 0.0 - 1.2 mg/dL 0.8 0.7 0.4  Alkaline Phos 44 - 121 IU/L 47 40(L) -  AST 0 - 40 IU/L 41(H) 32 27  ALT 0 - 44 IU/L 33 31 25     Assessment & Plan:   1) Uncontrolled type 2 diabetes mellitus with CKD  - Patient has currently uncontrolled symptomatic type 2 DM since  71  years of age.  He presents today, accompanied by his wife, with his meter and logs showing at target fasting glycemic profile.  His previsit A1c done at his PCP office on 1/9 was 7.9%, stable.  He denies any hypoglycemia.  Analysis of his meter shows 7-day average of 126, 14-day average of 133, 30-day average of 141, 90-day average of 132.  - His diabetes is complicated by worsening CKD, obesity/sedentary life and patient remains at a high risk for more acute and chronic complications of diabetes which include CAD, CVA, CKD, retinopathy, and neuropathy. These are all discussed in detail with the patient.  - Nutritional counseling repeated at each appointment due to patients tendency to fall back in to old habits.  - The patient admits there is a room for improvement in their diet and drink choices. -  Suggestion is made for the patient to avoid simple carbohydrates from their diet including Cakes, Sweet Desserts / Pastries, Ice Cream, Soda (diet and regular), Sweet Tea, Candies, Chips, Cookies, Sweet Pastries, Store Bought Juices, Alcohol in Excess of 1-2 drinks a day, Artificial Sweeteners, Coffee Creamer, and "Sugar-free" Products. This will help patient to have stable blood glucose profile and potentially avoid unintended weight gain.   - I encouraged the patient to switch to unprocessed or minimally processed complex starch and increased protein intake (animal or plant source), fruits, and vegetables.   - Patient is advised to stick to a routine mealtimes to eat 3 meals a day and avoid unnecessary snacks (to snack only to correct hypoglycemia).  - I have approached patient with the following individualized plan to manage diabetes and patient agrees:   -Given his stable, near target glycemic profile, no changes will be made to his medications today.  He is advised to continue Tresiba 55 units SQ nightly, Metformin 500 mg po daily with breakfast and Glipizide 5 mg XL daily with breakfast.   -He  is encouraged to continue monitoring blood glucose twice daily, before meals and before bed, and to call the clinic if he has readings less than 70 or greater than  200 for 3 tests in a row.  2) Lipids/HPL:  His most recent lipid panel from 04/08/21 shows controlled LDL of 63 and elevated but improved triglycerides of 238.  He is advised to continue Lipitor 40 mg po daily at bedtime, Fenofibrate 145 mg po daily and Fish Oil supplement 2 g po twice daily.  He is also advised to avoid fried foods and butter, when possible.   3) Hypertension:  His blood pressure is controlled to target.  He is encouraged to continue his medications including Lisinopril 5 mg p.o. daily.   4) Vitamin D deficiency -His most recent Vitamin D level was 31.9 on 04/08/21.  He is advised to continue with vitamin D3 supplement 5000 units daily as maintenance.  5) Weight management:  His Body mass index is 33.05 kg/m.--clearly complicating his diabetes management  He is a candidate for modest weight loss.  Carbs and exercise regimen discussed with him.  He is advised to maintain close follow-up with his PMD on routine basis.       I spent 30 minutes in the care of the patient today including review of labs from [place], Lipids, Thyroid Function, Hematology (current and previous including abstractions from other facilities); face-to-face time discussing  his blood glucose readings/logs, discussing hypoglycemia and hyperglycemia episodes and symptoms, medications doses, his options of short and long term treatment based on the latest standards of care / guidelines;  discussion about incorporating lifestyle medicine;  and documenting the encounter.    Please refer to Patient Instructions for Blood Glucose Monitoring and Insulin/Medications Dosing Guide"  in media tab for additional information. Please  also refer to " Patient Self Inventory" in the Media  tab for reviewed elements of pertinent patient history.  [name]  participated in the discussions, expressed understanding, and voiced agreement with the above plans.  All questions were answered to his satisfaction. he is encouraged to contact clinic should he have any questions or concerns prior to his return visit.    Follow up plan: - Return in about 4 months (around 08/24/2021) for Diabetes F/U with A1c in office, No previsit labs, Bring meter and logs.   Rayetta Pigg, [facility] [address] [place], [place] [address] Phone: [phone] Fax: [phone]  04/26/2021, 8:41 AM

## 2021-05-03 ENCOUNTER — Other Ambulatory Visit: Payer: Self-pay | Admitting: Nurse Practitioner

## 2021-05-03 DIAGNOSIS — E782 Mixed hyperlipidemia: Secondary | ICD-10-CM

## 2021-08-24 ENCOUNTER — Ambulatory Visit: Payer: PPO | Admitting: Nurse Practitioner

## 2021-09-01 ENCOUNTER — Other Ambulatory Visit: Payer: Self-pay | Admitting: Nurse Practitioner

## 2021-09-09 ENCOUNTER — Encounter: Payer: Self-pay | Admitting: Nurse Practitioner

## 2021-09-09 ENCOUNTER — Ambulatory Visit: Payer: PPO | Admitting: Nurse Practitioner

## 2021-09-09 VITALS — BP 148/74 | HR 71 | Ht 71.0 in | Wt 232.0 lb

## 2021-09-09 DIAGNOSIS — Z794 Long term (current) use of insulin: Secondary | ICD-10-CM

## 2021-09-09 DIAGNOSIS — E782 Mixed hyperlipidemia: Secondary | ICD-10-CM | POA: Diagnosis not present

## 2021-09-09 DIAGNOSIS — N1831 Chronic kidney disease, stage 3a: Secondary | ICD-10-CM | POA: Diagnosis not present

## 2021-09-09 DIAGNOSIS — E1122 Type 2 diabetes mellitus with diabetic chronic kidney disease: Secondary | ICD-10-CM

## 2021-09-09 DIAGNOSIS — E559 Vitamin D deficiency, unspecified: Secondary | ICD-10-CM | POA: Diagnosis not present

## 2021-09-09 DIAGNOSIS — I10 Essential (primary) hypertension: Secondary | ICD-10-CM

## 2021-09-09 LAB — POCT GLYCOSYLATED HEMOGLOBIN (HGB A1C): HbA1c POC (<> result, manual entry): 8 % (ref 4.0–5.6)

## 2021-09-09 MED ORDER — METFORMIN HCL 500 MG PO TABS: 500.0000 mg | ORAL_TABLET | Freq: Every day | ORAL | 3 refills | Status: DC

## 2021-09-09 MED ORDER — GLIPIZIDE ER 5 MG PO TB24: 5.0000 mg | ORAL_TABLET | Freq: Every day | ORAL | 3 refills | Status: DC

## 2021-09-09 MED ORDER — TRESIBA FLEXTOUCH 100 UNIT/ML ~~LOC~~ SOPN: 55.0000 [IU] | PEN_INJECTOR | Freq: Every day | SUBCUTANEOUS | 3 refills | Status: DC

## 2021-09-09 NOTE — Patient Instructions (Signed)
Diabetes Mellitus and Foot Care Foot care is an important part of your health, especially when you have diabetes. Diabetes may cause you to have problems because of poor blood flow (circulation) to your feet and legs, which can cause your skin to: Become thinner and drier. Break more easily. Heal more slowly. Peel and crack. You may also have nerve damage (neuropathy) in your legs and feet, causing decreased feeling in them. This means that you may not notice minor injuries to your feet that could lead to more serious problems. Noticing and addressing any potential problems early is the best way to prevent future foot problems. How to care for your feet Foot hygiene  Wash your feet daily with warm water and mild soap. Do not use hot water. Then, pat your feet and the areas between your toes until they are completely dry. Do not soak your feet as this can dry your skin. Trim your toenails straight across. Do not dig under them or around the cuticle. File the edges of your nails with an emery board or nail file. Apply a moisturizing lotion or petroleum jelly to the skin on your feet and to dry, brittle toenails. Use lotion that does not contain alcohol and is unscented. Do not apply lotion between your toes. Shoes and socks Wear clean socks or stockings every day. Make sure they are not too tight. Do not wear knee-high stockings since they may decrease blood flow to your legs. Wear shoes that fit properly and have enough cushioning. Always look in your shoes before you put them on to be sure there are no objects inside. To break in new shoes, wear them for just a few hours a day. This prevents injuries on your feet. Wounds, scrapes, corns, and calluses  Check your feet daily for blisters, cuts, bruises, sores, and redness. If you cannot see the bottom of your feet, use a mirror or ask someone for help. Do not cut corns or calluses or try to remove them with medicine. If you find a minor scrape,  cut, or break in the skin on your feet, keep it and the skin around it clean and dry. You may clean these areas with mild soap and water. Do not clean the area with peroxide, alcohol, or iodine. If you have a wound, scrape, corn, or callus on your foot, look at it several times a day to make sure it is healing and not infected. Check for: Redness, swelling, or pain. Fluid or blood. Warmth. Pus or a bad smell. General tips Do not cross your legs. This may decrease blood flow to your feet. Do not use heating pads or hot water bottles on your feet. They may burn your skin. If you have lost feeling in your feet or legs, you may not know this is happening until it is too late. Protect your feet from hot and cold by wearing shoes, such as at the beach or on hot pavement. Schedule a complete foot exam at least once a year (annually) or more often if you have foot problems. Report any cuts, sores, or bruises to your health care provider immediately. Where to find more information American Diabetes Association: www.diabetes.org Association of Diabetes Care & Education Specialists: www.diabeteseducator.org Contact a health care provider if: You have a medical condition that increases your risk of infection and you have any cuts, sores, or bruises on your feet. You have an injury that is not healing. You have redness on your legs or feet. You   feel burning or tingling in your legs or feet. You have pain or cramps in your legs and feet. Your legs or feet are numb. Your feet always feel cold. You have pain around any toenails. Get help right away if: You have a wound, scrape, corn, or callus on your foot and: You have pain, swelling, or redness that gets worse. You have fluid or blood coming from the wound, scrape, corn, or callus. Your wound, scrape, corn, or callus feels warm to the touch. You have pus or a bad smell coming from the wound, scrape, corn, or callus. You have a fever. You have a red  line going up your leg. Summary Check your feet every day for blisters, cuts, bruises, sores, and redness. Apply a moisturizing lotion or petroleum jelly to the skin on your feet and to dry, brittle toenails. Wear shoes that fit properly and have enough cushioning. If you have foot problems, report any cuts, sores, or bruises to your health care provider immediately. Schedule a complete foot exam at least once a year (annually) or more often if you have foot problems. This information is not intended to replace advice given to you by your health care provider. Make sure you discuss any questions you have with your health care provider. Document Revised: 10/17/2019 Document Reviewed: 10/17/2019 Elsevier Patient Education  2023 [employer]  

## 2021-09-09 NOTE — Progress Notes (Signed)
09/09/2021    Endocrinology follow-up note   Subjective:    Patient ID: Javier Walker, male    DOB: 15-Jul-1950.  He is being seen in follow-up  of management of diabetes, Hypertension, hyperlipidemia.   Past Medical History:  Diagnosis Date   Diabetes mellitus without complication (HCC)    GERD (gastroesophageal reflux disease)    occ-no meds   Hives    patient has hives with anxiety   Hyperlipemia    Hypertension    Shortness of breath    Past Surgical History:  Procedure Laterality Date   CHOLECYSTECTOMY N/A 08/26/2016   Procedure: CHOLECYSTECTOMY;  Surgeon: Franky Macho, MD;  Location: [facility];  Service: General;  Laterality: N/A;   COLON SURGERY  2011   gangrene lower colon with colostomy   COLOSTOMY REVERSAL  2011   ERCP N/A 08/17/2016   Procedure: ENDOSCOPIC RETROGRADE CHOLANGIOPANCREATOGRAPHY (ERCP) Biliary sphincterotomy and stone extraction;  Surgeon: Malissa Hippo, MD;  Location: [facility];  Service: Endoscopy;  Laterality: N/A;   OPEN REDUCTION INTERNAL FIXATION (ORIF) TIBIA/FIBULA FRACTURE Right 08/30/2012   Procedure: OPEN REDUCTION INTERNAL FIXATION (ORIF) TIBIA/FIBULA FRACTURE;  Surgeon: Toni Arthurs, MD;  Location:  [facility];  Service: Orthopedics;  Laterality: Right;   WOUND DEBRIDEMENT  2011   abd-   Social History   Socioeconomic History   Marital status: Married    Spouse name: Not on file   Number of children: Not on file   Years of education: Not on file   Highest education level: Not on file  Occupational History   Not on file  Tobacco Use   Smoking status: Former    Packs/day: 2.00    Years: 15.00    Pack years: 30.00    Types: Cigarettes    Quit date: 08/27/1988    Years since quitting: 33.0   Smokeless tobacco: Never  Vaping Use   Vaping Use: Never used  Substance and Sexual Activity   Alcohol use: Yes    Comment: rarely   Drug use: No   Sexual activity: Yes    Birth control/protection: None  Other Topics  Concern   Not on file  Social History Narrative   Not on file   Social Determinants of Health   Financial Resource Strain: Not on file  Food Insecurity: Not on file  Transportation Needs: Not on file  Physical Activity: Not on file  Stress: Not on file  Social Connections: Not on file   Outpatient Encounter Medications as of 09/09/2021  Medication Sig   acetaminophen (TYLENOL) 325 MG tablet Take 2 tablets (650 mg total) by mouth every 6 (six) hours as needed for mild pain, fever or headache (or Fever >/= 101).   aspirin EC 81 MG tablet Take 81 mg by mouth daily. Swallow whole.   atorvastatin (LIPITOR) 40 MG tablet Take 1 tablet (40 mg total) by mouth every evening.   B-D ULTRAFINE III SHORT PEN 31G X 8 MM MISC USE 1 EACH DAILY. USE TO INJECT INSULIN DAILY.   Cholecalciferol (VITAMIN D3) 125 MCG (5000 UT) CAPS Take 1 capsule (5,000 Units total) by mouth daily.   fenofibrate (TRICOR) 145 MG tablet Take 1 tablet by mouth once daily   glipiZIDE (GLUCOTROL XL) 5 MG 24 hr tablet Take 1 tablet (5 mg total) by mouth daily with breakfast.   glucose blood (ONETOUCH VERIO) test strip Use as instructed to monitor glucose twice daily, before breakfast and before bed.   insulin degludec (TRESIBA FLEXTOUCH)  100 UNIT/ML FlexTouch Pen Inject 55 Units into the skin at bedtime.   lisinopril (ZESTRIL) 5 MG tablet Take 5 mg by mouth daily.   metFORMIN (GLUCOPHAGE) 500 MG tablet Take 1 tablet (500 mg total) by mouth daily with breakfast.   Omega-3 Fatty Acids (FISH OIL) 1200 MG CAPS Take 2,400 mg by mouth daily.   [DISCONTINUED] glipiZIDE (GLUCOTROL XL) 5 MG 24 hr tablet Take 1 tablet (5 mg total) by mouth daily with breakfast.   [DISCONTINUED] insulin degludec (TRESIBA FLEXTOUCH) 100 UNIT/ML FlexTouch Pen Inject 55 Units into the skin at bedtime.   [DISCONTINUED] metFORMIN (GLUCOPHAGE) 500 MG tablet Take 1 tablet (500 mg total) by mouth daily with breakfast.   No facility-administered encounter medications  on file as of 09/09/2021.   ALLERGIES: Allergies  Allergen Reactions   Augmentin [Amoxicillin-Pot Clavulanate] Rash   VACCINATION STATUS: Immunization History  Administered Date(s) Administered   Influenza-Unspecified 02/03/2020    Diabetes He presents for his follow-up diabetic visit. He has type 2 diabetes mellitus. Onset time: He was diagnosed at approximate age of 71 years. His disease course has been stable. Hypoglycemia symptoms include sweats and tremors. Pertinent negatives for hypoglycemia include no confusion, pallor or seizures. Pertinent negatives for diabetes include no fatigue, no polydipsia, no polyphagia, no polyuria and no weakness. There are no hypoglycemic complications. Symptoms are stable. Diabetic complications include nephropathy. Risk factors for coronary artery disease include diabetes mellitus, dyslipidemia, hypertension, male sex, obesity, tobacco exposure and sedentary lifestyle. Current diabetic treatment includes oral agent (dual therapy) and insulin injections. He is compliant with treatment most of the time. His weight is decreasing steadily. He is following a generally healthy diet. Meal planning includes avoidance of concentrated sweets. He has not had a previous visit with a dietitian. He never participates in exercise. His home blood glucose trend is fluctuating minimally. His breakfast blood glucose range is generally 130-140 mg/dl. His overall blood glucose range is 130-140 mg/dl. (He presents today, accompanied by his wife, with his meter and logs showing at target fasting glycemic profile.  His POCT A1c today is 8%, essentially unchanged from previous visit of 7.9%.  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  I do question whether or not he dumps at night due to the Glipizide.  Analysis of his meter shows 7-day average of 132, 14-day average of 125, 30-day average of 125, 90-day average of 129.) An ACE  inhibitor/angiotensin II receptor blocker is being taken. He does not see a podiatrist.Eye exam is current.  Hyperlipidemia This is a chronic problem. The current episode started more than 1 year ago. The problem is controlled. Recent lipid tests were reviewed and are high. Exacerbating diseases include chronic renal disease, diabetes and obesity. Factors aggravating his hyperlipidemia include fatty foods. Pertinent negatives include no myalgias. Current antihyperlipidemic treatment includes statins and fibric acid derivatives. The current treatment provides mild improvement of lipids. Compliance problems include adherence to diet and adherence to exercise.  Risk factors for coronary artery disease include diabetes mellitus, dyslipidemia, hypertension, male sex, obesity and a sedentary lifestyle.  Hypertension This is a chronic problem. The current episode started more than 1 year ago. The problem has been gradually improving since onset. The problem is controlled. Associated symptoms include sweats. There are no associated agents to hypertension. Risk factors for coronary artery disease include diabetes mellitus, dyslipidemia, family history, obesity, male gender and sedentary lifestyle. Past treatments include ACE inhibitors. The  current treatment provides moderate improvement. Compliance problems include diet.  Hypertensive end-organ damage includes kidney disease. Identifiable causes of hypertension include chronic renal disease.   Review of systems  Constitutional: + Minimally fluctuating body weight,  current Body mass index is 32.36 kg/m. , no fatigue, no subjective hyperthermia, no subjective hypothermia Eyes: no blurry vision, no xerophthalmia ENT: no sore throat, no nodules palpated in throat, no dysphagia/odynophagia, no hoarseness Cardiovascular: no chest pain, no shortness of breath, no palpitations, no leg swelling Respiratory: no cough, no shortness of breath Gastrointestinal: no  nausea/vomiting/diarrhea Musculoskeletal: no muscle/joint aches Skin: no rashes, no hyperemia Neurological: no tremors, no numbness, no tingling, no dizziness Psychiatric: no depression, no anxiety   Objective:    BP (!) 148/74   Pulse 71   Ht 5\' 11"  (1.803 m)   Wt 232 lb (105.2 kg)   BMI 32.36 kg/m   Wt Readings from Last 3 Encounters:  09/09/21 232 lb (105.2 kg)  04/26/21 237 lb (107.5 kg)  12/23/20 234 lb 6.4 oz (106.3 kg)    BP Readings from Last 3 Encounters:  09/09/21 (!) 148/74  04/26/21 (!) 157/69  12/23/20 135/71     Physical Exam- Limited  Constitutional:  Body mass index is 32.36 kg/m. , not in acute distress, normal state of mind Eyes:  EOMI, no exophthalmos Neck: Supple Cardiovascular: RRR, no murmurs, rubs, or gallops, no edema Respiratory: Adequate breathing efforts, no crackles, rales, rhonchi, or wheezing Musculoskeletal: no gross deformities, strength intact in all four extremities, no gross restriction of joint movements Skin:  no rashes, no hyperemia Neurological: no tremor with outstretched hands   Diabetic Foot Exam - Simple   No data filed     Lipid Panel     Component Value Date/Time   CHOL 123 04/08/2021 0828   TRIG 238 (H) 04/08/2021 0828   HDL 21 (L) 04/08/2021 0828   CHOLHDL 5.9 (H) 04/08/2021 0828   CHOLHDL 10.4 (H) 03/04/2020 0709   VLDL 54 (H) 06/03/2018 0930   LDLCALC 63 04/08/2021 0828   LDLCALC  03/04/2020 0709     Comment:     . LDL cholesterol not calculated. Triglyceride levels greater than 400 mg/dL invalidate calculated LDL results. . Reference range: <100 . Desirable range <100 mg/dL for primary prevention;   <70 mg/dL for patients with CHD or diabetic patients  with > or = 2 CHD risk factors. 03/06/2020 LDL-C is now calculated using the Martin-Hopkins  calculation, which is a validated novel method providing  better accuracy than the Friedewald equation in the  estimation of LDL-C.  [name] et al. Horald Pollen.  Lenox Ahr): 2061-2068  (http://education.[facility].com/faq/FAQ164)    Recent Results (from the past 2160 hour(s))  HgB A1c     Status: Abnormal   Collection Time: 09/09/21  8:23 AM  Result Value Ref Range   Hemoglobin A1C     HbA1c POC (<> result, manual entry) 8.0 4.0 - 5.6 %   HbA1c, POC (prediabetic range)     HbA1c, POC (controlled diabetic range)          Latest Ref Rng & Units 04/08/2021    8:28 AM 07/23/2020    8:01 AM 03/04/2020    7:09 AM  CMP  Glucose 70 - 99 mg/dL 03/06/2020   081   448    BUN 8 - 27 mg/dL 17   16   19     Creatinine 0.76 - 1.27 mg/dL 185     6.31    Sodium  134 - 144 mmol/L 138   138   139    Potassium 3.5 - 5.2 mmol/L 4.9   4.8   4.6    Chloride 96 - 106 mmol/L 99   99   105    CO2 20 - 29 mmol/L Calcium 8.6 - 10.2 mg/dL 9.9   9.9   9.8    Total Protein 6.0 - 8.5 g/dL 7.3   7.2   7.2    Total Bilirubin 0.0 - 1.2 mg/dL 0.8   0.7   0.4    Alkaline Phos 44 - 121 IU/L 47   40     AST 0 - 40 IU/L 41   32   27    ALT 0 - 44 IU/L 33   31   25       Assessment & Plan:   1) Uncontrolled type 2 diabetes mellitus with CKD  - Patient has currently uncontrolled symptomatic type 2 DM since  71 years of age.  He presents today, accompanied by his wife, with his meter and logs showing at target fasting glycemic profile.  His POCT A1c today is 8%, essentially unchanged from previous visit of 7.9%.  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  I do question whether or not he dumps at night due to the Glipizide.  Analysis of his meter shows 7-day average of 132, 14-day average of 125, 30-day average of 125, 90-day average of 129.  - His diabetes is complicated by worsening CKD, obesity/sedentary life and patient remains at a high risk for more acute and chronic complications of diabetes which include CAD, CVA, CKD, retinopathy, and neuropathy. These are all discussed in detail with the  patient.  - Nutritional counseling repeated at each appointment due to patients tendency to fall back in to old habits.  - The patient admits there is a room for improvement in their diet and drink choices. -  Suggestion is made for the patient to avoid simple carbohydrates from their diet including Cakes, Sweet Desserts / Pastries, Ice Cream, Soda (diet and regular), Sweet Tea, Candies, Chips, Cookies, Sweet Pastries, Store Bought Juices, Alcohol in Excess of 1-2 drinks a day, Artificial Sweeteners, Coffee Creamer, and "Sugar-free" Products. This will help patient to have stable blood glucose profile and potentially avoid unintended weight gain.   - I encouraged the patient to switch to unprocessed or minimally processed complex starch and increased protein intake (animal or plant source), fruits, and vegetables.   - Patient is advised to stick to a routine mealtimes to eat 3 meals a day and avoid unnecessary snacks (to snack only to correct hypoglycemia).  - I have approached patient with the following individualized plan to manage diabetes and patient agrees:   -Given his stable, near target glycemic profile, no changes will be made to his medications today.  He is advised to continue Tresiba 55 units SQ nightly, Metformin 500 mg po daily with breakfast and Glipizide 5 mg XL daily with breakfast.   -He is encouraged to continue monitoring blood glucose twice daily, before meals and before bed, and to call the clinic if he has readings less than 70 or greater than 200 for 3 tests in a row.  He could benefit from CGM device given his insulin and Glipizide increasing his risk of hypoglycemia.  I gave him sample of Dexcom G7  today (helped him apply it) and sent in rx to [facility] for fulfillment.  2) Lipids/HPL:  His most recent lipid panel from 04/08/21 shows controlled LDL of 63 and elevated but improved triglycerides of 238.  He is advised to continue Lipitor 40 mg po daily at bedtime, Fenofibrate  145 mg po daily and Fish Oil supplement 2 g po twice daily.  He is also advised to avoid fried foods and butter, when possible.   3) Hypertension:  His blood pressure is controlled to target.  He is encouraged to continue his medications including Lisinopril 5 mg p.o. daily.   4) Vitamin D deficiency -His most recent Vitamin D level was 31.9 on 04/08/21.  He is advised to continue with vitamin D3 supplement 5000 units daily as maintenance.  5) Weight management:  His Body mass index is 32.36 kg/m.--clearly complicating his diabetes management  He is a candidate for modest weight loss.  Carbs and exercise regimen discussed with him.  He is advised to maintain close follow-up with his PMD on routine basis.      I spent 40 minutes in the care of the patient today including review of labs from CMP, Lipids, Thyroid Function, Hematology (current and previous including abstractions from other facilities); face-to-face time discussing  his blood glucose readings/logs, discussing hypoglycemia and hyperglycemia episodes and symptoms, medications doses, his options of short and long term treatment based on the latest standards of care / guidelines;  discussion about incorporating lifestyle medicine;  and documenting the encounter.    Please refer to Patient Instructions for Blood Glucose Monitoring and Insulin/Medications Dosing Guide"  in media tab for additional information. Please  also refer to " Patient Self Inventory" in the Media  tab for reviewed elements of pertinent patient history.  Candis SchatzWilliam Fenter participated in the discussions, expressed understanding, and voiced agreement with the above plans.  All questions were answered to his satisfaction. he is encouraged to contact clinic should he have any questions or concerns prior to his return visit.    Follow up plan: - Return in about 4 months (around 01/09/2022) for Diabetes F/U with A1c in office, No previsit labs, Bring meter and  logs.   Ronny BaconWhitney Aaron Bostwick, Humboldt General HospitalFNP-BC [facility] [address] [place], [place] [address] Phone: [phone] Fax: [phone]  09/09/2021, 10:28 AM

## 2021-09-10 DIAGNOSIS — E1122 Type 2 diabetes mellitus with diabetic chronic kidney disease: Secondary | ICD-10-CM | POA: Diagnosis not present

## 2021-10-10 DIAGNOSIS — E1122 Type 2 diabetes mellitus with diabetic chronic kidney disease: Secondary | ICD-10-CM | POA: Diagnosis not present

## 2021-10-27 ENCOUNTER — Other Ambulatory Visit: Payer: Self-pay | Admitting: Nurse Practitioner

## 2021-10-27 DIAGNOSIS — E782 Mixed hyperlipidemia: Secondary | ICD-10-CM

## 2021-11-10 DIAGNOSIS — E1122 Type 2 diabetes mellitus with diabetic chronic kidney disease: Secondary | ICD-10-CM | POA: Diagnosis not present

## 2021-12-11 DIAGNOSIS — E1122 Type 2 diabetes mellitus with diabetic chronic kidney disease: Secondary | ICD-10-CM | POA: Diagnosis not present

## 2022-01-10 ENCOUNTER — Encounter: Payer: Self-pay | Admitting: Nurse Practitioner

## 2022-01-10 ENCOUNTER — Ambulatory Visit: Payer: PPO | Admitting: Nurse Practitioner

## 2022-01-10 VITALS — BP 139/68 | HR 71 | Ht 71.0 in | Wt 232.6 lb

## 2022-01-10 DIAGNOSIS — E782 Mixed hyperlipidemia: Secondary | ICD-10-CM

## 2022-01-10 DIAGNOSIS — I10 Essential (primary) hypertension: Secondary | ICD-10-CM | POA: Diagnosis not present

## 2022-01-10 DIAGNOSIS — E559 Vitamin D deficiency, unspecified: Secondary | ICD-10-CM | POA: Diagnosis not present

## 2022-01-10 DIAGNOSIS — N1831 Chronic kidney disease, stage 3a: Secondary | ICD-10-CM

## 2022-01-10 DIAGNOSIS — E1122 Type 2 diabetes mellitus with diabetic chronic kidney disease: Secondary | ICD-10-CM | POA: Diagnosis not present

## 2022-01-10 DIAGNOSIS — Z794 Long term (current) use of insulin: Secondary | ICD-10-CM

## 2022-01-10 LAB — POCT GLYCOSYLATED HEMOGLOBIN (HGB A1C): Hemoglobin A1C: 7.7 % — AB (ref 4.0–5.6)

## 2022-01-10 MED ORDER — TRESIBA FLEXTOUCH 100 UNIT/ML ~~LOC~~ SOPN: 65.0000 [IU] | PEN_INJECTOR | Freq: Every day | SUBCUTANEOUS | 3 refills | Status: DC

## 2022-01-10 NOTE — Progress Notes (Signed)
01/10/2022    Endocrinology follow-up note   Subjective:    Patient ID: Javier Walker, male    DOB: 13-Jul-1950.  He is being seen in follow-up  of management of diabetes, Hypertension, hyperlipidemia.   Past Medical History:  Diagnosis Date   Diabetes mellitus without complication (HCC)    GERD (gastroesophageal reflux disease)    occ-no meds   Hives    patient has hives with anxiety   Hyperlipemia    Hypertension    Shortness of breath    Past Surgical History:  Procedure Laterality Date   CHOLECYSTECTOMY N/A 08/26/2016   Procedure: CHOLECYSTECTOMY;  Surgeon: Franky Macho, MD;  Location: [facility];  Service: General;  Laterality: N/A;   COLON SURGERY  2011   gangrene lower colon with colostomy   COLOSTOMY REVERSAL  2011   ERCP N/A 08/17/2016   Procedure: ENDOSCOPIC RETROGRADE CHOLANGIOPANCREATOGRAPHY (ERCP) Biliary sphincterotomy and stone extraction;  Surgeon: Malissa Hippo, MD;  Location: [facility];  Service: Endoscopy;  Laterality: N/A;   OPEN REDUCTION INTERNAL FIXATION (ORIF) TIBIA/FIBULA FRACTURE Right 08/30/2012   Procedure: OPEN REDUCTION INTERNAL FIXATION (ORIF) TIBIA/FIBULA FRACTURE;  Surgeon: Toni Arthurs, MD;  Location: [facility];  Service: Orthopedics;  Laterality: Right;   WOUND DEBRIDEMENT  2011   abd-   Social History   Socioeconomic History   Marital status: Married    Spouse name: Not on file   Number of children: Not on file   Years of education: Not on file   Highest education level: Not on file  Occupational History   Not on file  Tobacco Use   Smoking status: Former    Packs/day: 2.00    Years: 15.00    Total pack years: 30.00    Types: Cigarettes    Quit date: 08/27/1988    Years since quitting: 33.3   Smokeless tobacco: Never  Vaping Use   Vaping Use: Never used  Substance and Sexual Activity   Alcohol use: Yes    Comment: rarely   Drug use: No   Sexual activity: Yes    Birth control/protection: None  Other  Topics Concern   Not on file  Social History Narrative   Not on file   Social Determinants of Health   Financial Resource Strain: Unknown (06/03/2018)   Overall Financial Resource Strain (CARDIA)    Difficulty of Paying Living Expenses: Patient refused  Food Insecurity: Unknown (06/03/2018)   Hunger Vital Sign    Worried About Running Out of Food in the Last Year: Patient refused    Ran Out of Food in the Last Year: Patient refused  Transportation Needs: Unknown (06/03/2018)   PRAPARE - Transportation    Lack of Transportation (Medical): Patient refused    Lack of Transportation (Non-Medical): Patient refused  Physical Activity: Unknown (06/03/2018)   Exercise Vital Sign    Days of Exercise per Week: Patient refused    Minutes of Exercise per Session: Patient refused  Stress: Unknown (06/03/2018)   [employer] of Occupational Health - Occupational Stress Questionnaire    Feeling of Stress : Patient refused  Social Connections: Unknown (06/03/2018)   Social Connection and Isolation Panel [NHANES]    Frequency of Communication with Friends and Family: Patient refused    Frequency of Social Gatherings with Friends and Family: Patient refused    Attends Religious Services: Patient refused    Active Member of Clubs or Organizations: Patient refused    Attends Banker Meetings: Patient refused  Marital Status: Patient refused   Outpatient Encounter Medications as of 01/10/2022  Medication Sig   acetaminophen (TYLENOL) 325 MG tablet Take 2 tablets (650 mg total) by mouth every 6 (six) hours as needed for mild pain, fever or headache (or Fever >/= 101).   aspirin EC 81 MG tablet Take 81 mg by mouth daily. Swallow whole.   atorvastatin (LIPITOR) 40 MG tablet Take 1 tablet (40 mg total) by mouth every evening.   B-D ULTRAFINE III SHORT PEN 31G X 8 MM MISC USE 1 EACH DAILY. USE TO INJECT INSULIN DAILY.   Cholecalciferol (VITAMIN D3) 125 MCG (5000 UT) CAPS Take 1 capsule  (5,000 Units total) by mouth daily.   fenofibrate (TRICOR) 145 MG tablet Take 1 tablet by mouth once daily   glipiZIDE (GLUCOTROL XL) 5 MG 24 hr tablet Take 1 tablet (5 mg total) by mouth daily with breakfast.   glucose blood (ONETOUCH VERIO) test strip Use as instructed to monitor glucose twice daily, before breakfast and before bed.   lisinopril (ZESTRIL) 5 MG tablet Take 5 mg by mouth daily.   metFORMIN (GLUCOPHAGE) 500 MG tablet Take 1 tablet (500 mg total) by mouth daily with breakfast.   Omega-3 Fatty Acids (FISH OIL) 1200 MG CAPS Take 2,400 mg by mouth daily.   [DISCONTINUED] insulin degludec (TRESIBA FLEXTOUCH) 100 UNIT/ML FlexTouch Pen Inject 55 Units into the skin at bedtime.   insulin degludec (TRESIBA FLEXTOUCH) 100 UNIT/ML FlexTouch Pen Inject 65 Units into the skin at bedtime.   No facility-administered encounter medications on file as of 01/10/2022.   ALLERGIES: Allergies  Allergen Reactions   Augmentin [Amoxicillin-Pot Clavulanate] Rash   VACCINATION STATUS: Immunization History  Administered Date(s) Administered   Influenza-Unspecified 02/03/2020    Diabetes He presents for his follow-up diabetic visit. He has type 2 diabetes mellitus. Onset time: He was diagnosed at approximate age of 71 years. His disease course has been fluctuating. There are no hypoglycemic associated symptoms. Pertinent negatives for hypoglycemia include no confusion, pallor or seizures. Pertinent negatives for diabetes include no fatigue, no polydipsia, no polyphagia, no polyuria and no weakness. There are no hypoglycemic complications. Symptoms are stable. Diabetic complications include nephropathy. Risk factors for coronary artery disease include diabetes mellitus, dyslipidemia, hypertension, male sex, obesity, tobacco exposure and sedentary lifestyle. Current diabetic treatment includes oral agent (dual therapy) and insulin injections. He is compliant with treatment most of the time. His weight is  fluctuating minimally. He is following a generally healthy diet. Meal planning includes avoidance of concentrated sweets. He has not had a previous visit with a dietitian. He never participates in exercise. His home blood glucose trend is fluctuating minimally. His overall blood glucose range is >200 mg/dl. (He presents today with his CGM showing slightly above target glycemic profile overall.  His POCT A1c today is 7.7%, improving from last visit of 8%.  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  Analysis of his CGM shows TIR 34%, TAR 66%, TBR 0% with a GMI of 8.7%.) An ACE inhibitor/angiotensin II receptor blocker is being taken. He does not see a podiatrist.Eye exam is current.  Hyperlipidemia This is a chronic problem. The current episode started more than 1 year ago. The problem is controlled. Recent lipid tests were reviewed and are high. Exacerbating diseases include chronic renal disease, diabetes and obesity. Factors aggravating his hyperlipidemia include fatty foods. Pertinent negatives include  no myalgias. Current antihyperlipidemic treatment includes statins and fibric acid derivatives. The current treatment provides mild improvement of lipids. Compliance problems include adherence to diet and adherence to exercise.  Risk factors for coronary artery disease include diabetes mellitus, dyslipidemia, hypertension, male sex, obesity and a sedentary lifestyle.  Hypertension This is a chronic problem. The current episode started more than 1 year ago. The problem has been gradually improving since onset. The problem is controlled. There are no associated agents to hypertension. Risk factors for coronary artery disease include diabetes mellitus, dyslipidemia, family history, obesity, male gender and sedentary lifestyle. Past treatments include ACE inhibitors. The current treatment provides moderate improvement.  Compliance problems include diet.  Hypertensive end-organ damage includes kidney disease. Identifiable causes of hypertension include chronic renal disease.    Review of systems  Constitutional: + Minimally fluctuating body weight,  current Body mass index is 32.44 kg/m. , no fatigue, no subjective hyperthermia, no subjective hypothermia Eyes: no blurry vision, no xerophthalmia ENT: no sore throat, no nodules palpated in throat, no dysphagia/odynophagia, no hoarseness Cardiovascular: no chest pain, no shortness of breath, no palpitations, no leg swelling Respiratory: no cough, no shortness of breath Gastrointestinal: no nausea/vomiting/diarrhea Musculoskeletal: no muscle/joint aches Skin: no rashes, no hyperemia Neurological: no tremors, no numbness, no tingling, no dizziness Psychiatric: no depression, no anxiety   Objective:    BP 139/68 (BP Location: Right Arm, Patient Position: Sitting, Cuff Size: Large)   Pulse 71   Ht 5\' 11"  (1.803 m)   Wt 232 lb 9.6 oz (105.5 kg)   BMI 32.44 kg/m   Wt Readings from Last 3 Encounters:  01/10/22 232 lb 9.6 oz (105.5 kg)  09/09/21 232 lb (105.2 kg)  04/26/21 237 lb (107.5 kg)    BP Readings from Last 3 Encounters:  01/10/22 139/68  09/09/21 (!) 148/74  04/26/21 (!) 157/69      Physical Exam- Limited  Constitutional:  Body mass index is 32.44 kg/m. , not in acute distress, normal state of mind Eyes:  EOMI, no exophthalmos Neck: Supple Cardiovascular: RRR, no murmurs, rubs, or gallops, no edema Respiratory: Adequate breathing efforts, no crackles, rales, rhonchi, or wheezing Musculoskeletal: no gross deformities, strength intact in all four extremities, no gross restriction of joint movements Skin:  no rashes, no hyperemia Neurological: no tremor with outstretched hands   Diabetic Foot Exam - Simple   No data filed     Lipid Panel     Component Value Date/Time   CHOL 123 04/08/2021 0828   TRIG 238 (H) 04/08/2021 0828    HDL 21 (L) 04/08/2021 0828   CHOLHDL 5.9 (H) 04/08/2021 0828   CHOLHDL 10.4 (H) 03/04/2020 0709   VLDL 54 (H) 06/03/2018 0930   LDLCALC 63 04/08/2021 0828   LDLCALC  03/04/2020 0709     Comment:     . LDL cholesterol not calculated. Triglyceride levels greater than 400 mg/dL invalidate calculated LDL results. . Reference range: <100 . Desirable range <100 mg/dL for primary prevention;   <70 mg/dL for patients with CHD or diabetic patients  with > or = 2 CHD risk factors. [name] LDL-C is now calculated using the Martin-Hopkins  calculation, which is a validated novel method providing  better accuracy than the Friedewald equation in the  estimation of LDL-C.  Cresenciano Genre et al. Annamaria Helling. 5597;416(38): 2061-2068  (http://education.[facility].com/faq/FAQ164)    Recent Results (from the past 2160 hour(s))  HgB A1c     Status: Abnormal   Collection Time: 01/10/22  9:11 AM  Result Value Ref Range   Hemoglobin A1C 7.7 (A) 4.0 - 5.6 %   HbA1c POC (<> result, manual entry)     HbA1c, POC (prediabetic range)     HbA1c, POC (controlled diabetic range)           Latest Ref Rng & Units 04/08/2021    8:28 AM 07/23/2020    8:01 AM 03/04/2020    7:09 AM  CMP  Glucose 70 - 99 mg/dL 443  154  008   BUN 8 - 27 mg/dL 17  16  19    Creatinine 0.76 - 1.27 mg/dL  6.76  1.95   Sodium 134 - 144 mmol/L 138  138  139   Potassium 3.5 - 5.2 mmol/L 4.9  4.8  4.6   Chloride 96 - 106 mmol/L 99  99  105   CO2 20 - 29 mmol/L 25  23  26    Calcium 8.6 - 10.2 mg/dL 9.9  9.9  9.8   Total Protein 6.0 - 8.5 g/dL 7.3  7.2  7.2   Total Bilirubin 0.0 - 1.2 mg/dL 0.8  0.7  0.4   Alkaline Phos 44 - 121 IU/L 47  40    AST 0 - 40 IU/L 41  32  27   ALT 0 - 44 IU/L 33  31  25      Assessment & Plan:   1) Uncontrolled type 2 diabetes mellitus with CKD  - Patient has currently uncontrolled symptomatic type 2 DM since  71 years of age.  He presents today with his CGM showing slightly above target glycemic  profile overall.  His POCT A1c today is 7.7%, improving from last visit of 8%.  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  Analysis of his CGM shows TIR 34%, TAR 66%, TBR 0% with a GMI of 8.7%.  - His diabetes is complicated by worsening CKD, obesity/sedentary life and patient remains at a high risk for more acute and chronic complications of diabetes which include CAD, CVA, CKD, retinopathy, and neuropathy. These are all discussed in detail with the patient.  - Nutritional counseling repeated at each appointment due to patients tendency to fall back in to old habits.  - The patient admits there is a room for improvement in their diet and drink choices. -  Suggestion is made for the patient to avoid simple carbohydrates from their diet including Cakes, Sweet Desserts / Pastries, Ice Cream, Soda (diet and regular), Sweet Tea, Candies, Chips, Cookies, Sweet Pastries, Store Bought Juices, Alcohol in Excess of 1-2 drinks a day, Artificial Sweeteners, Coffee Creamer, and "Sugar-free" Products. This will help patient to have stable blood glucose profile and potentially avoid unintended weight gain.   - I encouraged the patient to switch to unprocessed or minimally processed complex starch and increased protein intake (animal or plant source), fruits, and vegetables.   - Patient is advised to stick to a routine mealtimes to eat 3 meals a day and avoid unnecessary snacks (to snack only to correct hypoglycemia).  - I have approached patient with the following individualized plan to manage diabetes and patient agrees:   -He will tolerate increase in his to 65 units SQ nightly.  He can continue his Metformin 500 mg po daily with breakfast and Glipizide 5 mg XL daily with breakfast. I did suggest he pack balanced meals in his truck  to prevent over-eating when he gets home from work.   -He is encouraged to  continue monitoring blood glucose twice daily (using his CGM), before meals and before bed, and to call the clinic if he has readings less than 70 or greater than 200 for 3 tests in a row.  He is benefiting from his CGM device, advised to continue.  2) Lipids/HPL:  His most recent lipid panel from 04/08/21 shows controlled LDL of 63 and elevated but improved triglycerides of 238.  He is advised to continue Lipitor 40 mg po daily at bedtime, Fenofibrate 145 mg po daily and Fish Oil supplement 2 g po twice daily.  Will repeat lipid panel prior to next visit.  3) Hypertension:  His blood pressure is controlled to target.  He is encouraged to continue his medications including Lisinopril 5 mg p.o. daily.   4) Vitamin D deficiency -His most recent Vitamin D level was 31.9 on 04/08/21.  He is advised to continue with vitamin D3 supplement 5000 units daily as maintenance.  5) Weight management:  His Body mass index is 32.44 kg/m.--clearly complicating his diabetes management  He is a candidate for modest weight loss.  Carbs and exercise regimen discussed with him.  He is advised to maintain close follow-up with his PMD on routine basis.       I spent 37 minutes in the care of the patient today including review of labs from CMP, Lipids, Thyroid Function, Hematology (current and previous including abstractions from other facilities); face-to-face time discussing  his blood glucose readings/logs, discussing hypoglycemia and hyperglycemia episodes and symptoms, medications doses, his options of short and long term treatment based on the latest standards of care / guidelines;  discussion about incorporating lifestyle medicine;  and documenting the encounter. Risk reduction counseling performed per USPSTF guidelines to reduce obesity and cardiovascular risk factors.     Please refer to Patient Instructions for Blood Glucose Monitoring and Insulin/Medications Dosing Guide"  in media tab for additional  information. Please  also refer to " Patient Self Inventory" in the Media  tab for reviewed elements of pertinent patient history.  [name] participated in the discussions, expressed understanding, and voiced agreement with the above plans.  All questions were answered to his satisfaction. he is encouraged to contact clinic should he have any questions or concerns prior to his return visit.    Follow up plan: - Return in about 4 months (around 05/13/2022) for Diabetes F/U with A1c in office, Previsit labs, Bring meter and logs.   Ronny Bacon, [facility] [address] [place], [place] [address] Phone: [phone] Fax: [phone]  01/10/2022, 9:21 AM

## 2022-01-25 ENCOUNTER — Other Ambulatory Visit: Payer: Self-pay | Admitting: Nurse Practitioner

## 2022-01-25 DIAGNOSIS — E782 Mixed hyperlipidemia: Secondary | ICD-10-CM

## 2022-02-10 DIAGNOSIS — E1122 Type 2 diabetes mellitus with diabetic chronic kidney disease: Secondary | ICD-10-CM | POA: Diagnosis not present

## 2022-02-25 ENCOUNTER — Other Ambulatory Visit: Payer: Self-pay | Admitting: Nurse Practitioner

## 2022-03-12 DIAGNOSIS — E1122 Type 2 diabetes mellitus with diabetic chronic kidney disease: Secondary | ICD-10-CM | POA: Diagnosis not present

## 2022-04-12 DIAGNOSIS — E1122 Type 2 diabetes mellitus with diabetic chronic kidney disease: Secondary | ICD-10-CM | POA: Diagnosis not present

## 2022-04-18 ENCOUNTER — Other Ambulatory Visit: Payer: Self-pay | Admitting: Nurse Practitioner

## 2022-04-18 DIAGNOSIS — E782 Mixed hyperlipidemia: Secondary | ICD-10-CM

## 2022-04-26 ENCOUNTER — Other Ambulatory Visit: Payer: Self-pay | Admitting: Nurse Practitioner

## 2022-04-26 DIAGNOSIS — E782 Mixed hyperlipidemia: Secondary | ICD-10-CM

## 2022-04-27 DIAGNOSIS — Z794 Long term (current) use of insulin: Secondary | ICD-10-CM | POA: Diagnosis not present

## 2022-04-27 DIAGNOSIS — E1122 Type 2 diabetes mellitus with diabetic chronic kidney disease: Secondary | ICD-10-CM | POA: Diagnosis not present

## 2022-04-27 DIAGNOSIS — E559 Vitamin D deficiency, unspecified: Secondary | ICD-10-CM | POA: Diagnosis not present

## 2022-04-27 DIAGNOSIS — N1831 Chronic kidney disease, stage 3a: Secondary | ICD-10-CM | POA: Diagnosis not present

## 2022-04-28 LAB — LIPID PANEL
Chol/HDL Ratio: 6.3 ratio — ABNORMAL HIGH (ref 0.0–5.0)
Cholesterol, Total: 139 mg/dL (ref 100–199)
HDL: 22 mg/dL — ABNORMAL LOW (ref >39)
LDL Chol Calc (NIH): 84 mg/dL (ref 0–99)
Triglycerides: 190 mg/dL — ABNORMAL HIGH (ref 0–149)
VLDL Cholesterol Cal: 33 mg/dL (ref 5–40)

## 2022-04-28 LAB — COMPREHENSIVE METABOLIC PANEL
ALT: 32 IU/L (ref 0–44)
AST: 40 IU/L (ref 0–40)
Albumin/Globulin Ratio: 1.3 (ref 1.2–2.2)
Albumin: 4.1 g/dL (ref 3.8–4.8)
Alkaline Phosphatase: 42 IU/L — ABNORMAL LOW (ref 44–121)
BUN/Creatinine Ratio: 12 (ref 10–24)
BUN: 16 mg/dL (ref 8–27)
Bilirubin Total: 0.8 mg/dL (ref 0.0–1.2)
CO2: 22 mmol/L (ref 20–29)
Calcium: 9.9 mg/dL (ref 8.6–10.2)
Chloride: 105 mmol/L (ref 96–106)
Creatinine, Ser: 1.31 mg/dL — ABNORMAL HIGH (ref 0.76–1.27)
Globulin, Total: 3.2 g/dL (ref 1.5–4.5)
Glucose: 113 mg/dL — ABNORMAL HIGH (ref 70–99)
Potassium: 4.9 mmol/L (ref 3.5–5.2)
Sodium: 142 mmol/L (ref 134–144)
Total Protein: 7.3 g/dL (ref 6.0–8.5)
eGFR: 58 mL/min/{1.73_m2} — ABNORMAL LOW (ref >59)

## 2022-04-28 LAB — T4, FREE: Free T4: 1.23 ng/dL (ref 0.82–1.77)

## 2022-04-28 LAB — VITAMIN D 25 HYDROXY (VIT D DEFICIENCY, FRACTURES): Vit D, 25-Hydroxy: 29.6 ng/mL — ABNORMAL LOW (ref 30.0–100.0)

## 2022-04-28 LAB — TSH: TSH: 4.39 u[IU]/mL (ref 0.450–4.500)

## 2022-05-12 ENCOUNTER — Ambulatory Visit: Payer: PPO | Admitting: Nurse Practitioner

## 2022-05-13 DIAGNOSIS — E1122 Type 2 diabetes mellitus with diabetic chronic kidney disease: Secondary | ICD-10-CM | POA: Diagnosis not present

## 2022-06-02 ENCOUNTER — Telehealth: Payer: Self-pay | Admitting: *Deleted

## 2022-06-02 NOTE — Progress Notes (Signed)
  Care Coordination  Outreach Note  06/02/2022 Name: Javier Walker MRN: [id number] DOB: 1950-07-18   Care Coordination Outreach Attempts: An unsuccessful telephone outreach was attempted today to offer the patient information about available care coordination services as a benefit of their health plan.   Follow Up Plan:  Additional outreach attempts will be made to offer the patient care coordination information and services.   Encounter Outcome:  No Answer  [place]  Direct Dial: [phone]

## 2022-06-07 ENCOUNTER — Encounter: Payer: Self-pay | Admitting: Nurse Practitioner

## 2022-06-07 ENCOUNTER — Ambulatory Visit: Payer: PPO | Admitting: Nurse Practitioner

## 2022-06-07 VITALS — BP 138/65 | HR 70 | Ht 71.0 in | Wt 231.4 lb

## 2022-06-07 DIAGNOSIS — Z794 Long term (current) use of insulin: Secondary | ICD-10-CM | POA: Diagnosis not present

## 2022-06-07 DIAGNOSIS — E1122 Type 2 diabetes mellitus with diabetic chronic kidney disease: Secondary | ICD-10-CM | POA: Diagnosis not present

## 2022-06-07 DIAGNOSIS — E782 Mixed hyperlipidemia: Secondary | ICD-10-CM

## 2022-06-07 DIAGNOSIS — I10 Essential (primary) hypertension: Secondary | ICD-10-CM | POA: Diagnosis not present

## 2022-06-07 DIAGNOSIS — E559 Vitamin D deficiency, unspecified: Secondary | ICD-10-CM | POA: Diagnosis not present

## 2022-06-07 DIAGNOSIS — N1831 Chronic kidney disease, stage 3a: Secondary | ICD-10-CM

## 2022-06-07 LAB — POCT GLYCOSYLATED HEMOGLOBIN (HGB A1C): Hemoglobin A1C: 8 % — AB (ref 4.0–5.6)

## 2022-06-07 MED ORDER — METFORMIN HCL 500 MG PO TABS: 500.0000 mg | ORAL_TABLET | Freq: Every day | ORAL | 3 refills | Status: DC

## 2022-06-07 MED ORDER — FREESTYLE LIBRE 3 SENSOR MISC: 3 refills | Status: DC

## 2022-06-07 MED ORDER — TRESIBA FLEXTOUCH 100 UNIT/ML ~~LOC~~ SOPN: 75.0000 [IU] | PEN_INJECTOR | Freq: Every day | SUBCUTANEOUS | 3 refills | Status: DC

## 2022-06-07 MED ORDER — GLIPIZIDE ER 5 MG PO TB24: 5.0000 mg | ORAL_TABLET | Freq: Every day | ORAL | 3 refills | Status: DC

## 2022-06-07 MED ORDER — BD PEN NEEDLE SHORT U/F 31G X 8 MM MISC: 3 refills | Status: DC

## 2022-06-07 NOTE — Progress Notes (Signed)
06/07/2022    Endocrinology follow-up note   Subjective:    Patient ID: Javier Walker, male    DOB: Mar 17, 1951.  He is being seen in follow-up  of management of diabetes, Hypertension, hyperlipidemia.   Past Medical History:  Diagnosis Date   Diabetes mellitus without complication ([place])    GERD (gastroesophageal reflux disease)    occ-no meds   Hives    patient has hives with anxiety   Hyperlipemia    Hypertension    Shortness of breath    Past Surgical History:  Procedure Laterality Date   CHOLECYSTECTOMY N/A 08/26/2016   Procedure: CHOLECYSTECTOMY;  Surgeon: Aviva Signs, MD;  Location: [facility];  Service: General;  Laterality: N/A;   COLON SURGERY  2011   gangrene lower colon with colostomy   COLOSTOMY REVERSAL  2011   ERCP N/A 08/17/2016   Procedure: ENDOSCOPIC RETROGRADE CHOLANGIOPANCREATOGRAPHY (ERCP) Biliary sphincterotomy and stone extraction;  Surgeon: Rogene Houston, MD;  Location: [facility];  Service: Endoscopy;  Laterality: N/A;   OPEN REDUCTION INTERNAL FIXATION (ORIF) TIBIA/FIBULA FRACTURE Right 08/30/2012   Procedure: OPEN REDUCTION INTERNAL FIXATION (ORIF) TIBIA/FIBULA FRACTURE;  Surgeon: Wylene Simmer, MD;  Location: [place];  Service: Orthopedics;  Laterality: Right;   WOUND DEBRIDEMENT  2011   abd-   Social History   Socioeconomic History   Marital status: Married    Spouse name: Not on file   Number of children: Not on file   Years of education: Not on file   Highest education level: Not on file  Occupational History   Not on file  Tobacco Use   Smoking status: Former    Packs/day: 2.00    Years: 15.00    Total pack years: 30.00    Types: Cigarettes    Quit date: 08/27/1988    Years since quitting: 33.8   Smokeless tobacco: Never  Vaping Use   Vaping Use: Never used  Substance and Sexual Activity   Alcohol use: Yes    Comment: rarely   Drug use: No   Sexual activity: Yes    Birth control/protection: None  Other  Topics Concern   Not on file  Social History Narrative   Not on file   Social Determinants of Health   Financial Resource Strain: Unknown (06/03/2018)   Overall Financial Resource Strain (CARDIA)    Difficulty of Paying Living Expenses: Patient refused  Food Insecurity: Unknown (06/03/2018)   Hunger Vital Sign    Worried About Running Out of Food in the Last Year: Patient refused    [place] in the Last Year: Patient refused  Transportation Needs: Unknown (06/03/2018)   PRAPARE - Transportation    Lack of Transportation (Medical): Patient refused    Lack of Transportation (Non-Medical): Patient refused  Physical Activity: Unknown (06/03/2018)   Exercise Vital Sign    Days of Exercise per Week: Patient refused    Minutes of Exercise per Session: Patient refused  Stress: Unknown (06/03/2018)   [employer] [place]    Feeling of Stress : Patient refused  Social Connections: Unknown (06/03/2018)   Social Connection and Isolation Panel [NHANES]    Frequency of Communication with Friends and Family: Patient refused    Frequency of Social Gatherings with Friends and Family: Patient refused    Attends Religious Services: Patient refused    Active Member of Clubs or Organizations: Patient refused    Attends Archivist Meetings: Patient refused  Marital Status: Patient refused   Outpatient Encounter Medications as of 06/07/2022  Medication Sig   acetaminophen (TYLENOL) 325 MG tablet Take 2 tablets (650 mg total) by mouth every 6 (six) hours as needed for mild pain, fever or headache (or Fever >/= 101).   aspirin EC 81 MG tablet Take 81 mg by mouth daily. Swallow whole.   atorvastatin (LIPITOR) 40 MG tablet Take 1 tablet (40 mg total) by mouth every evening.   Cholecalciferol (VITAMIN D3) 125 MCG (5000 UT) CAPS Take 1 capsule (5,000 Units total) by mouth daily.   Continuous Blood Gluc Sensor (FREESTYLE LIBRE 3 SENSOR)  MISC Use to check glucose continuously   fenofibrate (TRICOR) 145 MG tablet Take 1 tablet by mouth once daily   glucose blood (ONETOUCH VERIO) test strip Use as instructed to monitor glucose twice daily, before breakfast and before bed.   lisinopril (ZESTRIL) 5 MG tablet Take 5 mg by mouth daily.   Omega-3 Fatty Acids (FISH OIL) 1200 MG CAPS Take 2,400 mg by mouth daily.   [DISCONTINUED] B-D ULTRAFINE III SHORT PEN 31G X 8 MM MISC USE 1 EACH DAILY. USE TO INJECT INSULIN DAILY.   [DISCONTINUED] glipiZIDE (GLUCOTROL XL) 5 MG 24 hr tablet Take 1 tablet (5 mg total) by mouth daily with breakfast.   [DISCONTINUED] insulin degludec (TRESIBA FLEXTOUCH) 100 UNIT/ML FlexTouch Pen Inject 65 Units into the skin at bedtime.   [DISCONTINUED] metFORMIN (GLUCOPHAGE) 500 MG tablet Take 1 tablet (500 mg total) by mouth daily with breakfast.   glipiZIDE (GLUCOTROL XL) 5 MG 24 hr tablet Take 1 tablet (5 mg total) by mouth daily with breakfast.   insulin degludec (TRESIBA FLEXTOUCH) 100 UNIT/ML FlexTouch Pen Inject 75 Units into the skin at bedtime.   Insulin Pen Needle (B-D ULTRAFINE III SHORT PEN) 31G X 8 MM MISC Use to inject insulin once daily.   metFORMIN (GLUCOPHAGE) 500 MG tablet Take 1 tablet (500 mg total) by mouth daily with breakfast.   No facility-administered encounter medications on file as of 06/07/2022.   ALLERGIES: Allergies  Allergen Reactions   Augmentin [Amoxicillin-Pot Clavulanate] Rash   VACCINATION STATUS: Immunization History  Administered Date(s) Administered   Influenza-Unspecified 02/03/2020    Diabetes He presents for his follow-up diabetic visit. He has type 2 diabetes mellitus. Onset time: He was diagnosed at approximate age of 50 years. His disease course has been stable. There are no hypoglycemic associated symptoms. Pertinent negatives for hypoglycemia include no confusion, pallor or seizures. Pertinent negatives for diabetes include no fatigue, no polydipsia, no polyphagia,  no polyuria and no weakness. There are no hypoglycemic complications. Symptoms are stable. Diabetic complications include nephropathy. Risk factors for coronary artery disease include diabetes mellitus, dyslipidemia, hypertension, male sex, obesity, tobacco exposure and sedentary lifestyle. Current diabetic treatment includes oral agent (dual therapy) and insulin injections. He is compliant with treatment most of the time. His weight is fluctuating minimally. He is following a generally healthy diet. Meal planning includes avoidance of concentrated sweets. He has not had a previous visit with a dietitian. He never participates in exercise. His home blood glucose trend is fluctuating minimally. His overall blood glucose range is >200 mg/dl. (He presents today, accompanied by his wife, with his CGM showing stable, slightly above target glycemic profile overall.  His POCT A1c today is 8%, increasing slightly from last visit of 7.7%.  He reports work has been slow recently and he has been more sedentary than usual but work is starting to pick back up  again.  Analysis of his CGM shows TIR 27%, TAR 73%, TBR 0% with a GMI of 8.7%.) An ACE inhibitor/angiotensin II receptor blocker is being taken. He does not see a podiatrist.Eye exam is current.  Hyperlipidemia This is a chronic problem. The current episode started more than 1 year ago. The problem is controlled. Recent lipid tests were reviewed and are high. Exacerbating diseases include chronic renal disease, diabetes and obesity. Factors aggravating his hyperlipidemia include fatty foods. Pertinent negatives include no myalgias. Current antihyperlipidemic treatment includes statins and fibric acid derivatives. The current treatment provides mild improvement of lipids. Compliance problems include adherence to diet and adherence to exercise.  Risk factors for coronary artery disease include diabetes mellitus, dyslipidemia, hypertension, male sex, obesity and a sedentary  lifestyle.  Hypertension This is a chronic problem. The current episode started more than 1 year ago. The problem has been gradually improving since onset. The problem is controlled. There are no associated agents to hypertension. Risk factors for coronary artery disease include diabetes mellitus, dyslipidemia, family history, obesity, male gender and sedentary lifestyle. Past treatments include ACE inhibitors. The current treatment provides moderate improvement. Compliance problems include diet.  Hypertensive end-organ damage includes kidney disease. Identifiable causes of hypertension include chronic renal disease.    Review of systems  Constitutional: + Minimally fluctuating body weight,  current Body mass index is 32.27 kg/m. , no fatigue, no subjective hyperthermia, no subjective hypothermia Eyes: no blurry vision, no xerophthalmia ENT: no sore throat, no nodules palpated in throat, no dysphagia/odynophagia, no hoarseness Cardiovascular: no chest pain, no shortness of breath, no palpitations, no leg swelling Respiratory: no cough, no shortness of breath Gastrointestinal: no nausea/vomiting/diarrhea Musculoskeletal: no muscle/joint aches Skin: no rashes, no hyperemia Neurological: no tremors, no numbness, no tingling, no dizziness Psychiatric: no depression, no anxiety   Objective:    BP 138/65 (BP Location: Left Arm, Patient Position: Sitting, Cuff Size: Large)   Pulse 70   Ht '5\' 11"'$  (1.803 m)   Wt 231 lb 6.4 oz (105 kg)   BMI 32.27 kg/m   Wt Readings from Last 3 Encounters:  06/07/22 231 lb 6.4 oz (105 kg)  01/10/22 232 lb 9.6 oz (105.5 kg)  09/09/21 232 lb (105.2 kg)    BP Readings from Last 3 Encounters:  06/07/22 138/65  01/10/22 139/68  09/09/21 (!) 148/74      Physical Exam- Limited  Constitutional:  Body mass index is 32.27 kg/m. , not in acute distress, normal state of mind Eyes:  EOMI, no exophthalmos Musculoskeletal: no gross deformities, strength intact  in all four extremities, no gross restriction of joint movements Skin:  no rashes, no hyperemia Neurological: no tremor with outstretched hands   Diabetic Foot Exam - Simple   Simple Foot Form Diabetic Foot exam was performed with the following findings: Yes 06/07/2022  8:17 AM  Visual Inspection See comments: Yes Sensation Testing Intact to touch and monofilament testing bilaterally: Yes Pulse Check Posterior Tibialis and Dorsalis pulse intact bilaterally: Yes Comments Dry flaky skin bilaterally with multiple calluses- nails in need of trim- mild onychomycosis.      Lipid Panel     Component Value Date/Time   CHOL 139 04/27/2022 0828   TRIG 190 (H) 04/27/2022 0828   HDL 22 (L) 04/27/2022 0828   CHOLHDL 6.3 (H) 04/27/2022 0828   CHOLHDL 10.4 (H) 03/04/2020 0709   VLDL 54 (H) 06/03/2018 0930   LDLCALC 84 04/27/2022 0828   LDLCALC  03/04/2020 [id number]  Comment:     . LDL cholesterol not calculated. Triglyceride levels greater than 400 mg/dL invalidate calculated LDL results. . Reference range: <100 . Desirable range <100 mg/dL for primary prevention;   <70 mg/dL for patients with CHD or diabetic patients  with > or = 2 CHD risk factors. [name] LDL-C is now calculated using the Martin-Hopkins  calculation, which is a validated novel method providing  better accuracy than the Friedewald equation in the  estimation of LDL-C.  Cresenciano Genre et al. Annamaria Helling. [id number]): 2061-2068  (http://education.[facility].com/faq/FAQ164)    Recent Results (from the past 2160 hour(s))  Comprehensive metabolic panel     Status: Abnormal   Collection Time: 04/27/22  8:28 AM  Result Value Ref Range   Glucose 113 (H) 70 - 99 mg/dL   BUN 16 8 - 27 mg/dL   Creatinine, Ser 1.31 (H) 0.76 - 1.27 mg/dL   eGFR 58 (L) >59 mL/min/1.73   BUN/Creatinine Ratio 12 10 - 24   Sodium 142 134 - 144 mmol/L   Potassium 4.9 3.5 - 5.2 mmol/L   Chloride 105 96 - 106 mmol/L   CO2 22 20 - 29 mmol/L   Calcium  9.9 8.6 - 10.2 mg/dL   Total Protein 7.3 6.0 - 8.5 g/dL   Albumin 4.1 3.8 - 4.8 g/dL   Globulin, Total 3.2 1.5 - 4.5 g/dL   Albumin/Globulin Ratio 1.3 1.2 - 2.2   Bilirubin Total 0.8 0.0 - 1.2 mg/dL   Alkaline Phosphatase 42 (L) 44 - 121 IU/L   AST 40 0 - 40 IU/L   ALT 32 0 - 44 IU/L  Lipid panel     Status: Abnormal   Collection Time: 04/27/22  8:28 AM  Result Value Ref Range   Cholesterol, Total 139 100 - 199 mg/dL   Triglycerides 190 (H) 0 - 149 mg/dL   HDL 22 (L) >39 mg/dL   VLDL Cholesterol Cal 33 5 - 40 mg/dL   LDL Chol Calc (NIH) 84 0 - 99 mg/dL   Chol/HDL Ratio 6.3 (H) 0.0 - 5.0 ratio    Comment:                                   T. Chol/HDL Ratio                                             Men  Women                               1/2 Avg.Risk  3.4    3.3                                   Avg.Risk  5.0    4.4                                2X Avg.Risk  9.6    7.1                                3X Avg.Risk 23.4   11.0  TSH     Status: None   Collection Time: 04/27/22  8:28 AM  Result Value Ref Range   TSH 4.390 0.450 - 4.500 uIU/mL  T4, free     Status: None   Collection Time: 04/27/22  8:28 AM  Result Value Ref Range   Free T4 1.23 0.82 - 1.77 ng/dL  VITAMIN D 25 Hydroxy (Vit-D Deficiency, Fractures)     Status: Abnormal   Collection Time: 04/27/22  8:28 AM  Result Value Ref Range   Vit D, 25-Hydroxy 29.6 (L) 30.0 - 100.0 ng/mL    Comment: Vitamin D deficiency has been defined by the [place] and an Endocrine Society practice guideline as a level of serum 25-OH vitamin D less than 20 ng/mL (1,2). The Endocrine Society went on to further define vitamin D insufficiency as a level between 21 and 29 ng/mL (2). 1. IOM (Institute of Medicine). 2010. Dietary reference    intakes for calcium and D. [place]: The    [employer]. 2. Holick MF, [place], Bischoff-Ferrari HA, et al.    Evaluation, treatment, and prevention of vitamin D     deficiency: an Endocrine Society clinical practice    guideline. JCEM. 2011 Jul; 96(7):1911-30.   HgB A1c     Status: Abnormal   Collection Time: 06/07/22  8:11 AM  Result Value Ref Range   Hemoglobin A1C 8.0 (A) 4.0 - 5.6 %   HbA1c POC (<> result, manual entry)     HbA1c, POC (prediabetic range)     HbA1c, POC (controlled diabetic range)           Latest Ref Rng & Units 04/27/2022    8:28 AM 04/08/2021    8:28 AM 07/23/2020    8:01 AM  CMP  Glucose 70 - 99 mg/dL 113  130  140   BUN 8 - 27 mg/dL '16  17  16   '$ Creatinine 0.76 - 1.27 mg/dL 1.31  1.31  1.28   Sodium 134 - 144 mmol/L 142  138  138   Potassium 3.5 - 5.2 mmol/L 4.9  4.9  4.8   Chloride 96 - 106 mmol/L 105  99  99   CO2 20 - 29 mmol/L '22  25  23   '$ Calcium 8.6 - 10.2 mg/dL 9.9  9.9  9.9   Total Protein 6.0 - 8.5 g/dL 7.3  7.3  7.2   Total Bilirubin 0.0 - 1.2 mg/dL 0.8  0.8  0.7   Alkaline Phos 44 - 121 IU/L 42  47  40   AST 0 - 40 IU/L 40  41  32   ALT 0 - 44 IU/L 32  33  31      Assessment & Plan:   1) Uncontrolled type 2 diabetes mellitus with CKD  - Patient has currently uncontrolled symptomatic type 2 DM since  72 years of age.  He presents today, accompanied by his wife, with his CGM showing stable, slightly above target glycemic profile overall.  His POCT A1c today is 8%, increasing slightly from last visit of 7.7%.  He reports work has been slow recently and he has been more sedentary than usual but work is starting to pick back up again.  Analysis of his CGM shows TIR 27%, TAR 73%, TBR 0% with a GMI of 8.7%.  - His diabetes is complicated by worsening CKD, obesity/sedentary life and patient remains at a high risk for more acute and chronic complications of diabetes which  include CAD, CVA, CKD, retinopathy, and neuropathy. These are all discussed in detail with the patient.  - Nutritional counseling repeated at each appointment due to patients tendency to fall back in to old habits.  - The patient admits  there is a room for improvement in their diet and drink choices. -  Suggestion is made for the patient to avoid simple carbohydrates from their diet including Cakes, Sweet Desserts / Pastries, Ice Cream, Soda (diet and regular), Sweet Tea, Candies, Chips, Cookies, Sweet Pastries, Store Bought Juices, Alcohol in Excess of 1-2 drinks a day, Artificial Sweeteners, Coffee Creamer, and "Sugar-free" Products. This will help patient to have stable blood glucose profile and potentially avoid unintended weight gain.   - I encouraged the patient to switch to unprocessed or minimally processed complex starch and increased protein intake (animal or plant source), fruits, and vegetables.   - Patient is advised to stick to a routine mealtimes to eat 3 meals a day and avoid unnecessary snacks (to snack only to correct hypoglycemia).  - I have approached patient with the following individualized plan to manage diabetes and patient agrees:   -He will tolerate increase in his [place] to 75 units SQ nightly.  He can continue his Metformin 500 mg po daily with breakfast and Glipizide 5 mg XL daily with breakfast.   -He is encouraged to continue monitoring blood glucose twice daily (using his CGM), before meals and before bed, and to call the clinic if he has readings less than 70 or greater than 200 for 3 tests in a row.  He is benefiting from his CGM device, advised to continue.  2) Lipids/HPL:  His most recent lipid panel from 04/27/22 shows controlled LDL of 84 and elevated but improved triglycerides of 190.  He is advised to continue Lipitor 40 mg po daily at bedtime, Fenofibrate 145 mg po daily and Fish Oil supplement 2 g po twice daily.  He is encouraged to stay away from fried foods or red meats.  3) Hypertension:  His blood pressure is controlled to target.  He is encouraged to continue his medications including Lisinopril 5 mg p.o. daily.   4) Vitamin D deficiency -His most recent Vitamin D level was 29.6 on  04/27/22.  He is advised to continue with vitamin D3 supplement 5000 units daily as maintenance.  5) Weight management:  His Body mass index is 32.27 kg/m.--clearly complicating his diabetes management  He is a candidate for modest weight loss.  Carbs and exercise regimen discussed with him.  He is advised to maintain close follow-up with his PMD on routine basis.       I spent  43  minutes in the care of the patient today including review of labs from [place], Lipids, Thyroid Function, Hematology (current and previous including abstractions from other facilities); face-to-face time discussing  his blood glucose readings/logs, discussing hypoglycemia and hyperglycemia episodes and symptoms, medications doses, his options of short and long term treatment based on the latest standards of care / guidelines;  discussion about incorporating lifestyle medicine;  and documenting the encounter. Risk reduction counseling performed per USPSTF guidelines to reduce obesity and cardiovascular risk factors.     Please refer to Patient Instructions for Blood Glucose Monitoring and Insulin/Medications Dosing Guide"  in media tab for additional information. Please  also refer to " Patient Self Inventory" in the Media  tab for reviewed elements of pertinent patient history.  [name] participated in the discussions, expressed understanding, and voiced agreement  with the above plans.  All questions were answered to his satisfaction. he is encouraged to contact clinic should he have any questions or concerns prior to his return visit.    Follow up plan: - Return in about 4 months (around 10/06/2022) for Diabetes F/U with A1c in office.   Rayetta Pigg, [facility] [address] [place], [place] [address] Phone: [phone] Fax: [phone]  06/07/2022, 8:29 AM

## 2022-06-07 NOTE — Progress Notes (Signed)
  Care Coordination  Outreach Note  06/07/2022 Name: Javier Walker MRN: [id number] DOB: 10-12-1950   Care Coordination Outreach Attempts: A second unsuccessful outreach was attempted today to offer the patient with information about available care coordination services as a benefit of their health plan.     Follow Up Plan:  Additional outreach attempts will be made to offer the patient care coordination information and services.   Encounter Outcome:  No Answer  [place]  Direct Dial: [phone]

## 2022-06-10 NOTE — Progress Notes (Signed)
  Care Coordination   Note   06/10/2022 Name: Javier Walker MRN: [id number] DOB: 17-Nov-1950  [name] is a 72 y.o. year old male who sees Sharilyn Sites, MD for primary care. I reached out to [name] by phone today to offer care coordination services.  [name] was given information about Care Coordination services today including:   The Care Coordination services include support from the care team which includes your Nurse Coordinator, Clinical Social Worker, or Pharmacist.  The Care Coordination team is here to help remove barriers to the health concerns and goals most important to you. Care Coordination services are voluntary, and the patient may decline or stop services at any time by request to their care team member.   Care Coordination Consent Status: Patient agreed to services and verbal consent obtained.   Follow up plan:  Telephone appointment with care coordination team member scheduled for:  06/27/22  Encounter Outcome:  Pt. Scheduled  [place]  Direct Dial: [phone]

## 2022-06-27 ENCOUNTER — Ambulatory Visit: Payer: Self-pay | Admitting: *Deleted

## 2022-06-27 ENCOUNTER — Encounter: Payer: Self-pay | Admitting: *Deleted

## 2022-06-27 NOTE — Patient Outreach (Signed)
Care Coordination   Initial Visit Note   06/27/2022 Name: Javier Walker MRN: [id number] DOB: [birth year]  Javier Walker is a 72 y.o. year old male who sees Sharilyn Sites, MD for primary care. I spoke with  [name] by phone today.  What matters to the patients health and wellness today?  Managing cholesterol, DM, and HTN    Goals Addressed             This Visit's Progress    Manage Blood Pressure       Care Coordination Goals: Patient will take medications as directed and report any negative side effects to provider  Patient will use a pill box/organizer to help keep up with when to take medications Patient will monitor and record blood pressure 2-3 times per week and as needed and will call PCP or specialist with any readings outside of recommended range Patient will keep all recommended follow-up appointments with PCP and specialists (cardiology, nephrology, etc) Patient will take blood pressure log to PCP and specialty appointments for review Patient will follow a low sodium/DASH diet  Patient will reach out to [place] Coordinator [phone] with any care coordination or resource needs       Manage Blood Sugar       Care Coordination Goals: Patient will follow-up with PCP and/or endocrinologist every 3 months or as recommended Patient will schedule yearly visit with PCP within the next couple of months Patient will take medication as prescribed and reach out to provider with any negative side effects Patient will continue to monitor and record blood sugar 4 times per day and as needed with continuous glucose monitor (CGM), and will call PCP or endocrinologist with any readings outside of recommended range Patient will take blood sugar log and meter to provider visits for review Patient will follow a modified carbohydrate diet and decrease simple carbohydrates and sugars Patient will increase activity level as tolerated with an ultimate goal of at least 150 minutes  of exercise per week Patient will check feet daily for sores, wounds, calluses, etc and will notify provider of any abnormal findings Patient will have yearly eye exams to check for or monitor diabetic retinopathy Patient will reach out to [place] Coordinator [phone] with any care coordination or resource needs      Manage Cholesterol       Care Coordination Goals: Patient will review education materials mailed on "HDL cholesterol: How to boost your 'good' cholesterol" and "Cholesterol: Top foods to improve your numbers" Patient will state understanding of LDL and HDL and of educational materials provided Patient will schedule a yearly follow-up with PCP Patient will take medications as prescribed  Patient will follow a Heart Healthy diet Patient will increase actiivty level as tolerated with a goal of at least 150 min per week. Consideration given to chronic ankle pain s/p multiple fractures a few years ago Patient will get out and walk around for at least a few minutes every few hours while driving (drives long distances for work) Patient will reach out to [place] with any resource or care coordination needs        SDOH assessments and interventions completed:  Yes  SDOH Interventions Today    Flowsheet Row Most Recent Value  SDOH Interventions   Food Insecurity Interventions Intervention Not Indicated  Housing Interventions Intervention Not Indicated  Transportation Interventions Intervention Not Indicated  Utilities Interventions Intervention Not Indicated  Financial Strain Interventions Intervention Not Indicated  Physical Activity Interventions Patient  Refused  [ankle fractured in multiple places several years ago and still has pain with increased activity that limits his ability to exercise]        Care Coordination Interventions:  Yes, provided  Interventions Today    Flowsheet Row Most Recent Value  Chronic Disease   Chronic disease during today's  visit Diabetes, Hypertension (HTN), Other  [Hyperlipidemia]  General Interventions   General Interventions Discussed/Reviewed General Interventions Discussed, General Interventions Reviewed, Labs, Doctor Visits, Health Screening, Annual Eye Exam, Annual Foot Exam, Lipid Profile, Durable Medical Equipment (DME)  Labs Hgb A1c every 3 months, Kidney Function  Doctor Visits Discussed/Reviewed Doctor Visits Discussed, Doctor Visits Reviewed, PCP, Annual Wellness Visits, Specialist  [reviewed endocrinology office notes and lab reports and discussed with patient]  Health Screening Prostate, Colonoscopy  Durable Medical Equipment (DME) BP Cuff, Glucomoter  [CGM]  PCP/Specialist Visits Compliance with follow-up visit  [schedule yearly f/u with PCP]  Exercise Interventions   Exercise Discussed/Reviewed Physical Activity, Exercise Reviewed, Exercise Discussed  Physical Activity Discussed/Reviewed Physical Activity Discussed, Physical Activity Reviewed  [exercise limited by chronic ankle pain. Encouraged to get out and walk for at least a few minutes every few hours while he is driving long distances for work]  Education Interventions   Education Provided Provided Engineer, site, Provided Education  [printed education on cholesterol management]  Provided Verbal Education On Nutrition, Blood Sugar Monitoring, Exercise, Medication, When to see the doctor, Labs  Labs Reviewed Hgb A1c, Lipid Profile  Nutrition Interventions   Nutrition Discussed/Reviewed Nutrition Discussed, Nutrition Reviewed, Carbohydrate meal planning  Pharmacy Interventions   Pharmacy Dicussed/Reviewed Medications and their functions       Follow up plan: Follow up call scheduled for 07/28/22    Encounter Outcome:  Pt. Visit Completed   Chong Sicilian, BSN, RN-BC RN Care Coordinator [place]: [phone] Main #: [phone]

## 2022-07-26 DIAGNOSIS — Z0000 Encounter for general adult medical examination without abnormal findings: Secondary | ICD-10-CM | POA: Diagnosis not present

## 2022-07-26 DIAGNOSIS — Z6832 Body mass index (BMI) 32.0-32.9, adult: Secondary | ICD-10-CM | POA: Diagnosis not present

## 2022-07-26 DIAGNOSIS — Z1331 Encounter for screening for depression: Secondary | ICD-10-CM | POA: Diagnosis not present

## 2022-07-26 DIAGNOSIS — E1129 Type 2 diabetes mellitus with other diabetic kidney complication: Secondary | ICD-10-CM | POA: Diagnosis not present

## 2022-07-26 DIAGNOSIS — E6609 Other obesity due to excess calories: Secondary | ICD-10-CM | POA: Diagnosis not present

## 2022-07-26 DIAGNOSIS — E782 Mixed hyperlipidemia: Secondary | ICD-10-CM | POA: Diagnosis not present

## 2022-07-26 DIAGNOSIS — I10 Essential (primary) hypertension: Secondary | ICD-10-CM | POA: Diagnosis not present

## 2022-07-28 ENCOUNTER — Encounter: Payer: Self-pay | Admitting: *Deleted

## 2022-07-28 ENCOUNTER — Ambulatory Visit: Payer: Self-pay | Admitting: *Deleted

## 2022-08-23 ENCOUNTER — Other Ambulatory Visit: Payer: Self-pay | Admitting: Nurse Practitioner

## 2022-08-29 ENCOUNTER — Encounter: Payer: Self-pay | Admitting: *Deleted

## 2022-08-29 ENCOUNTER — Ambulatory Visit: Payer: Self-pay | Admitting: *Deleted

## 2022-08-29 NOTE — Patient Outreach (Signed)
Care Coordination   Follow Up Visit Note   07/28/2022 Name: Javier Walker MRN: [id number] DOB: 25-May-1950  Javier Walker is a 72 y.o. year old male who sees Assunta Found, MD for primary care. I spoke with  Javier Walker by phone today.  What matters to the patients health and wellness today?  Managing blood sugar and blood pressure    Goals Addressed             This Visit's Progress    Manage Blood Pressure   On track    Care Coordination Goals: Patient will take medications as directed and report any negative side effects to provider  Patient will use a pill box/organizer to help keep up with when to take medications Patient will monitor and record blood pressure 2-3 times per week and as needed and will call PCP or specialist with any readings outside of recommended range Patient will keep all recommended follow-up appointments with PCP and specialists (cardiology, nephrology, etc) Patient will take blood pressure log to PCP and specialty appointments for review Patient will follow a low sodium/DASH diet  Patient will reach out to RN Care Coordinator [phone] with any care coordination or resource needs       Manage Blood Sugar   On track    Care Coordination Goals: Patient will follow-up with PCP and/or endocrinologist every 3 months or as recommended Patient will take medication as prescribed and reach out to provider with any negative side effects Patient will continue to monitor and record blood sugar 4 times per day and as needed with continuous glucose monitor (CGM), and will call PCP or endocrinologist with any readings outside of recommended range Patient will take blood sugar log and meter to provider visits for review Patient will follow a modified carbohydrate diet and decrease simple carbohydrates and sugars Patient will review handouts on diabetic meal planning Patient will increase activity level as tolerated with an ultimate goal of at least 150  minutes of exercise per week Patient will check feet daily for sores, wounds, calluses, etc and will notify provider of any abnormal findings Patient will have yearly eye exams to check for or monitor diabetic retinopathy Patient will reach out to RN Care Coordinator [phone] with any care coordination or resource needs      Manage Cholesterol   On track    Care Coordination Goals: Patient will review education materials mailed on "HDL cholesterol: How to boost your 'good' cholesterol" and "Cholesterol: Top foods to improve your numbers" Patient will state understanding of LDL and HDL and of educational materials provided Patient will take medications as prescribed  Patient will follow a Heart Healthy diet Patient will increase actiivty level as tolerated with a goal of at least 150 min per week. Consideration given to chronic ankle pain s/p multiple fractures a few years ago Patient will get out and walk around for at least a few minutes every few hours while driving (drives long distances for work) Patient will reach out to [employer] Coordinator with any resource or care coordination needs        SDOH assessments and interventions completed:  Yes  SDOH Interventions Today    Flowsheet Row Most Recent Value  SDOH Interventions   Transportation Interventions Intervention Not Indicated  Financial Strain Interventions Intervention Not Indicated        Care Coordination Interventions:  Yes, provided  Interventions Today    Flowsheet Row Most Recent Value  Chronic Disease   Chronic disease during  today's visit Diabetes, Hypertension (HTN), Other  [HLD]  General Interventions   General Interventions Discussed/Reviewed General Interventions Discussed, General Interventions Reviewed, Labs, Annual Eye Exam, Annual Foot Exam, Lipid Profile, Durable Medical Equipment (DME), Doctor Visits  Labs Hgb A1c every 6 months, Kidney Function  [lipid panel]  Doctor Visits Discussed/Reviewed  Doctor Visits Discussed, Specialist, Doctor Visits Reviewed, PCP  Durable Medical Equipment (DME) BP Cuff, Glucomoter  [continuous glucose monitor]  PCP/Specialist Visits Compliance with follow-up visit  Exercise Interventions   Exercise Discussed/Reviewed Physical Activity  Physical Activity Discussed/Reviewed Physical Activity Reviewed  Education Interventions   Education Provided Provided Printed Education, Provided Education  Provided Verbal Education On Nutrition, Foot Care, Eye Care, Labs, Blood Sugar Monitoring, When to see the doctor, Medication  Labs Reviewed Lipid Profile       Follow up plan: Follow up call scheduled for 08/29/22    Encounter Outcome:  Pt. Visit Completed   Demetrios Loll, BSN, RN-BC RN Care Coordinator [facility]  [facility] Direct Dial: [phone] Main #: [phone]

## 2022-09-01 NOTE — Patient Outreach (Signed)
Care Coordination   Follow Up Visit Note   08/29/2022 Name: Javier Walker MRN: [id number] DOB: May 13, 1950  Javier Walker is a 72 y.o. year old male who sees Assunta Found, MD for primary care. I spoke with  [name] by phone today.  What matters to the patients health and wellness today?  Managing blood sugar, cholesterol, and blood pressure    Goals Addressed             This Visit's Progress    Manage Blood Pressure   On track    Care Coordination Goals: Patient will take medications as directed and report any negative side effects to provider  Patient will use a pill box/organizer to help keep up with when to take medications Patient will monitor and record blood pressure 2-3 times per week and as needed and will call PCP or specialist with any readings outside of recommended range Patient will keep all recommended follow-up appointments with PCP and specialists (cardiology, nephrology, etc) Patient will take blood pressure log to PCP and specialty appointments for review Patient will follow a low sodium/DASH diet  Patient will reach out to RN Care Coordinator [phone] with any care coordination or resource needs       Manage Blood Sugar   On track    Care Coordination Goals: Patient will follow-up with PCP and/or endocrinologist every 3 months or as recommended Patient will take medication as prescribed and reach out to provider with any negative side effects Patient will continue to monitor and record blood sugar 4 times per day and as needed with continuous glucose monitor (CGM), and will call PCP or endocrinologist with any readings outside of recommended range Patient will take blood sugar log and meter to provider visits for review Patient will follow a modified carbohydrate diet and decrease simple carbohydrates and sugars Patient will review handouts on diabetic meal planning Patient will increase activity level as tolerated with an ultimate goal of at  least 150 minutes of exercise per week Patient will check feet daily for sores, wounds, calluses, etc and will notify provider of any abnormal findings Patient will have yearly eye exams to check for or monitor diabetic retinopathy Patient will reach out to RN Care Coordinator [phone] with any care coordination or resource needs      Manage Cholesterol   On track    Care Coordination Goals: Patient will review education materials mailed on "HDL cholesterol: How to boost your 'good' cholesterol" and "Cholesterol: Top foods to improve your numbers" Patient will state understanding of LDL and HDL and of educational materials provided Patient will take medications as prescribed  Patient will follow a Heart Healthy diet Patient will increase actiivty level as tolerated with a goal of at least 150 min per week. Consideration given to chronic ankle pain s/p multiple fractures a few years ago Patient will get out and walk around for at least a few minutes every few hours while driving (drives long distances for work) Patient will reach out to RN Care Coordinator with any resource or care coordination needs        SDOH assessments and interventions completed:  No     Care Coordination Interventions:  Yes, provided  Interventions Today    Flowsheet Row Most Recent Value  Chronic Disease   Chronic disease during today's visit Diabetes, Other, Hypertension (HTN)  [HLD]  General Interventions   General Interventions Discussed/Reviewed General Interventions Discussed, General Interventions Reviewed, Lipid Profile, Labs, Doctor Visits, Health Screening, Durable Medical  Equipment (DME)  Labs Hgb A1c every 3 months, Kidney Function  Doctor Visits Discussed/Reviewed Doctor Visits Discussed, Doctor Visits Reviewed, Annual Wellness Visits, PCP, Specialist  Health Screening Colonoscopy, Prostate  Durable Medical Equipment (DME) Glucomoter, BP Cuff  PCP/Specialist Visits Compliance with follow-up  visit  Exercise Interventions   Exercise Discussed/Reviewed Physical Activity  Physical Activity Discussed/Reviewed Physical Activity Discussed, Physical Activity Reviewed  Education Interventions   Education Provided Provided Education  Provided Verbal Education On Blood Sugar Monitoring, Labs, Nutrition, Mental Health/Coping with Illness, When to see the doctor, Medication, Exercise  Labs Reviewed Hgb A1c, Kidney Function, Lipid Profile  Nutrition Interventions   Nutrition Discussed/Reviewed Nutrition Discussed, Nutrition Reviewed, Carbohydrate meal planning, Portion sizes, Decreasing sugar intake, Fluid intake, Increasing proteins  Pharmacy Interventions   Pharmacy Dicussed/Reviewed Pharmacy Topics Discussed, Pharmacy Topics Reviewed, Medications and their functions  Safety Interventions   Safety Discussed/Reviewed Safety Discussed, Safety Reviewed       Follow up plan: Follow up call scheduled for 09/29/22    Encounter Outcome:  Pt. Visit Completed   Demetrios Loll, BSN, RN-BC RN Care Coordinator [facility]  [facility] Direct Dial: [phone] Main #: [phone]

## 2022-09-29 ENCOUNTER — Ambulatory Visit: Payer: Self-pay | Admitting: *Deleted

## 2022-09-29 NOTE — Patient Outreach (Signed)
  Care Coordination   09/29/2022 Name: Javier Walker MRN: [id number] DOB: 1950/10/12   Care Coordination Outreach Attempts:  An unsuccessful telephone outreach was attempted for a scheduled appointment today.  Follow Up Plan:  Additional outreach attempts will be made to offer the patient care coordination information and services.   Encounter Outcome:  No Answer. Left HIPAA compliant VM   Care Coordination Interventions:  No, not indicated    Demetrios Loll, BSN, RN-BC RN Care Coordinator [facility]  [facility] Direct Dial: [phone] Main #: [phone]

## 2022-10-10 ENCOUNTER — Ambulatory Visit: Payer: PPO | Admitting: Nurse Practitioner

## 2022-10-17 ENCOUNTER — Ambulatory Visit: Payer: PPO | Admitting: Nurse Practitioner

## 2022-10-17 ENCOUNTER — Encounter: Payer: Self-pay | Admitting: Nurse Practitioner

## 2022-10-17 VITALS — BP 146/80 | HR 71 | Ht 71.0 in | Wt 229.4 lb

## 2022-10-17 DIAGNOSIS — E782 Mixed hyperlipidemia: Secondary | ICD-10-CM | POA: Diagnosis not present

## 2022-10-17 DIAGNOSIS — Z7984 Long term (current) use of oral hypoglycemic drugs: Secondary | ICD-10-CM | POA: Diagnosis not present

## 2022-10-17 DIAGNOSIS — I10 Essential (primary) hypertension: Secondary | ICD-10-CM | POA: Diagnosis not present

## 2022-10-17 DIAGNOSIS — E1122 Type 2 diabetes mellitus with diabetic chronic kidney disease: Secondary | ICD-10-CM

## 2022-10-17 DIAGNOSIS — E559 Vitamin D deficiency, unspecified: Secondary | ICD-10-CM | POA: Diagnosis not present

## 2022-10-17 DIAGNOSIS — Z794 Long term (current) use of insulin: Secondary | ICD-10-CM

## 2022-10-17 DIAGNOSIS — N1831 Chronic kidney disease, stage 3a: Secondary | ICD-10-CM | POA: Diagnosis not present

## 2022-10-17 LAB — POCT GLYCOSYLATED HEMOGLOBIN (HGB A1C): Hemoglobin A1C: 8.1 % — AB (ref 4.0–5.6)

## 2022-10-17 NOTE — Progress Notes (Signed)
10/17/2022    Endocrinology follow-up note   Subjective:    Patient ID: Javier Walker, male    DOB: 1950/08/12.  He is being seen in follow-up  of management of diabetes, Hypertension, hyperlipidemia.   Past Medical History:  Diagnosis Date   Diabetes mellitus without complication (HCC)    GERD (gastroesophageal reflux disease)    occ-no meds   Hives    patient has hives with anxiety   Hyperlipemia    Hypertension    Shortness of breath    Past Surgical History:  Procedure Laterality Date   CHOLECYSTECTOMY N/A 08/26/2016   Procedure: CHOLECYSTECTOMY;  Surgeon: Franky Macho, MD;  Location: [facility];  Service: General;  Laterality: N/A;   COLON SURGERY  2011   gangrene lower colon with colostomy   COLOSTOMY REVERSAL  2011   ERCP N/A 08/17/2016   Procedure: ENDOSCOPIC RETROGRADE CHOLANGIOPANCREATOGRAPHY (ERCP) Biliary sphincterotomy and stone extraction;  Surgeon: Malissa Hippo, MD;  Location: [facility];  Service: Endoscopy;  Laterality: N/A;   OPEN REDUCTION INTERNAL FIXATION (ORIF) TIBIA/FIBULA FRACTURE Right 08/30/2012   Procedure: OPEN REDUCTION INTERNAL FIXATION (ORIF) TIBIA/FIBULA FRACTURE;  Surgeon: Toni Arthurs, MD;  Location:  SUR[facility]ERY CENTER;  Service: Orthopedics;  Laterality: Right;   WOUND DEBRIDEMENT  2011   abd-   Social History   Socioeconomic History   Marital status: Married    Spouse name: Not on file   Number of children: Not on file   Years of education: Not on file   Highest education level: Not on file  Occupational History   Not on file  Tobacco Use   Smoking status: Former    Packs/day: 2.00    Years: 15.00    Additional pack years: 0.00    Total pack years: 30.00    Types: Cigarettes    Quit date: 08/27/1988    Years since quitting: 34.1   Smokeless tobacco: Never  Vaping Use   Vaping Use: Never used  Substance and Sexual Activity   Alcohol use: Yes    Comment: rarely   Drug use: No   Sexual activity: Yes    Birth  control/protection: None  Other Topics Concern   Not on file  Social History Narrative   Not on file   Social Determinants of Health   Financial Resource Strain: Low Risk  (07/28/2022)   Overall Financial Resource Strain (CARDIA)    Difficulty of Paying Living Expenses: Not very hard  Food Insecurity: No Food Insecurity (06/27/2022)   Hunger Vital Sign    Worried About Running Out of Food in the Last Year: Never true    Ran Out of Food in the Last Year: Never true  Transportation Needs: No Transportation Needs (07/28/2022)   PRAPARE - Administrator, Civil Service (Medical): No    Lack of Transportation (Non-Medical): No  Physical Activity: Inactive (06/27/2022)   Exercise Vital Sign    Days of Exercise per Week: 0 days    Minutes of Exercise per Session: 0 min  Stress: Unknown (06/03/2018)   [employer] of Occupational Health - Occupational Stress Questionnaire    Feeling of Stress : Patient declined  Social Connections: Unknown (06/03/2018)   Social Connection and Isolation Panel [NHANES]    Frequency of Communication with Friends and Family: Patient declined    Frequency of Social Gatherings with Friends and Family: Patient declined    Attends Religious Services: Patient declined    Database administrator or Organizations: Patient  declined    Attends Banker Meetings: Patient declined    Marital Status: Patient declined   Outpatient Encounter Medications as of 10/17/2022  Medication Sig   acetaminophen (TYLENOL) 325 MG tablet Take 2 tablets (650 mg total) by mouth every 6 (six) hours as needed for mild pain, fever or headache (or Fever >/= 101).   aspirin EC 81 MG tablet Take 81 mg by mouth daily. Swallow whole.   atorvastatin (LIPITOR) 40 MG tablet Take 1 tablet (40 mg total) by mouth every evening.   Cholecalciferol (VITAMIN D3) 125 MCG (5000 UT) CAPS Take 1 capsule (5,000 Units total) by mouth daily.   Continuous Blood Gluc Sensor (FREESTYLE LIBRE  3 SENSOR) MISC Use to check glucose continuously   fenofibrate (TRICOR) 145 MG tablet Take 1 tablet by mouth once daily   glipiZIDE (GLUCOTROL XL) 5 MG 24 hr tablet Take 1 tablet (5 mg total) by mouth daily with breakfast.   glucose blood (ONETOUCH VERIO) test strip Use as instructed to monitor glucose twice daily, before breakfast and before bed.   insulin degludec (TRESIBA FLEXTOUCH) 100 UNIT/ML FlexTouch Pen Inject 75 Units into the skin at bedtime.   Insulin Pen Needle (B-D ULTRAFINE III SHORT PEN) 31G X 8 MM MISC USE 1 EACH DAILY. USE TO INJECT INSULIN DAILY.   lisinopril (ZESTRIL) 5 MG tablet Take 5 mg by mouth daily.   metFORMIN (GLUCOPHAGE) 500 MG tablet Take 1 tablet (500 mg total) by mouth daily with breakfast.   Omega-3 Fatty Acids (FISH OIL) 1200 MG CAPS Take 2,400 mg by mouth daily.   No facility-administered encounter medications on file as of 10/17/2022.   ALLERGIES: Allergies  Allergen Reactions   Augmentin [Amoxicillin-Pot Clavulanate] Rash   VACCINATION STATUS: Immunization History  Administered Date(s) Administered   Influenza-Unspecified 02/03/2020    Diabetes He presents for his follow-up diabetic visit. He has type 2 diabetes mellitus. Onset time: He was diagnosed at approximate age of 54 years. His disease course has been stable. There are no hypoglycemic associated symptoms. Pertinent negatives for hypoglycemia include no confusion, pallor or seizures. Pertinent negatives for diabetes include no fatigue, no polydipsia, no polyphagia, no polyuria and no weakness. There are no hypoglycemic complications. Symptoms are stable. Diabetic complications include nephropathy. Risk factors for coronary artery disease include diabetes mellitus, dyslipidemia, hypertension, male sex, obesity, tobacco exposure and sedentary lifestyle. Current diabetic treatment includes oral agent (dual therapy) and insulin injections. He is compliant with treatment most of the time. His weight is  fluctuating minimally. He is following a generally healthy diet. Meal planning includes avoidance of concentrated sweets. He has not had a previous visit with a dietitian. He never participates in exercise. His home blood glucose trend is fluctuating minimally. His overall blood glucose range is 180-200 mg/dl. (He presents today, accompanied by his wife, with his CGM showing stable, slightly above target glycemic profile overall.  His POCT A1c today is 8.1%, increasing slightly from last visit of 8%.  He reports he has been more stressed lately.  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  Analysis of his CGM shows TIR 41%, TAR 59%, TBR 0% with a GMI of 8.1%.) An ACE inhibitor/angiotensin II receptor blocker is being taken. He does not see a podiatrist.Eye exam is current.  Hyperlipidemia This is a chronic problem. The current episode started more than 1 year ago. The problem is controlled. Recent lipid tests were reviewed and are high. Exacerbating diseases include  chronic renal disease, diabetes and obesity. Factors aggravating his hyperlipidemia include fatty foods. Pertinent negatives include no myalgias. Current antihyperlipidemic treatment includes statins and fibric acid derivatives. The current treatment provides mild improvement of lipids. Compliance problems include adherence to diet and adherence to exercise.  Risk factors for coronary artery disease include diabetes mellitus, dyslipidemia, hypertension, male sex, obesity and a sedentary lifestyle.  Hypertension This is a chronic problem. The current episode started more than 1 year ago. The problem has been gradually improving since onset. The problem is controlled. There are no associated agents to hypertension. Risk factors for coronary artery disease include diabetes mellitus, dyslipidemia, family history, obesity, male gender and sedentary lifestyle. Past treatments include ACE inhibitors. The current treatment  provides moderate improvement. Compliance problems include diet.  Hypertensive end-organ damage includes kidney disease. Identifiable causes of hypertension include chronic renal disease.    Review of systems  Constitutional: + Minimally fluctuating body weight,  current Body mass index is 31.99 kg/m. , no fatigue, no subjective hyperthermia, no subjective hypothermia Eyes: no blurry vision, no xerophthalmia ENT: no sore throat, no nodules palpated in throat, no dysphagia/odynophagia, no hoarseness Cardiovascular: no chest pain, no shortness of breath, no palpitations, no leg swelling Respiratory: no cough, no shortness of breath Gastrointestinal: no nausea/vomiting/diarrhea Musculoskeletal: no muscle/joint aches Skin: no rashes, no hyperemia Neurological: no tremors, no numbness, no tingling, no dizziness Psychiatric: no depression, no anxiety   Objective:    BP (!) 146/80 Comment: Retake with manuel cuff- patient to take medication when he gets home  Pulse 71   Ht 5\' 11"  (1.803 m)   Wt 229 lb 6.4 oz (104.1 kg)   BMI 31.99 kg/m   Wt Readings from Last 3 Encounters:  10/17/22 229 lb 6.4 oz (104.1 kg)  06/07/22 231 lb 6.4 oz (105 kg)  01/10/22 232 lb 9.6 oz (105.5 kg)    BP Readings from Last 3 Encounters:  10/17/22 (!) 146/80  06/07/22 138/65  01/10/22 139/68      Physical Exam- Limited  Constitutional:  Body mass index is 31.99 kg/m. , not in acute distress, normal state of mind Eyes:  EOMI, no exophthalmos Musculoskeletal: no gross deformities, strength intact in all four extremities, no gross restriction of joint movements Skin:  no rashes, no hyperemia Neurological: no tremor with outstretched hands   Diabetic Foot Exam - Simple   No data filed     Lipid Panel     Component Value Date/Time   CHOL 139 04/27/2022 0828   TRIG 190 (H) 04/27/2022 0828   HDL 22 (L) 04/27/2022 0828   CHOLHDL 6.3 (H) 04/27/2022 0828   CHOLHDL 10.4 (H) 03/04/2020 0709   VLDL  54 (H) 06/03/2018 0930   LDLCALC 84 04/27/2022 0828   LDLCALC  03/04/2020 0709     Comment:     . LDL cholesterol not calculated. Triglyceride levels greater than 400 mg/dL invalidate calculated LDL results. . Reference range: <100 . Desirable range <100 mg/dL for primary prevention;   <70 mg/dL for patients with CHD or diabetic patients  with > or = 2 CHD risk factors. [name] LDL-C is now calculated using the Martin-Hopkins  calculation, which is a validated novel method providing  better accuracy than the Friedewald equation in the  estimation of LDL-C.  Horald Pollen et al. Lenox Ahr. 1610;960(45): 2061-2068  (http://education.[facility].com/faq/FAQ164)    Recent Results (from the past 2160 hour(s))  HgB A1c     Status: Abnormal   Collection Time: 10/17/22  9:47  AM  Result Value Ref Range   Hemoglobin A1C 8.1 (A) 4.0 - 5.6 %   HbA1c POC (<> result, manual entry)     HbA1c, POC (prediabetic range)     HbA1c, POC (controlled diabetic range)            Latest Ref Rng & Units 04/27/2022    8:28 AM 04/08/2021    8:28 AM 07/23/2020    8:01 AM  CMP  Glucose 70 - 99 mg/dL 161  096  045   BUN 8 - 27 mg/dL 16  17  16    Creatinine 0.76 - 1.27 mg/dL 4.09  8.11  9.14   Sodium 134 - 144 mmol/L 142  138  138   Potassium 3.5 - 5.2 mmol/L 4.9  4.9  4.8   Chloride 96 - 106 mmol/L 105  99  99   CO2 20 - 29 mmol/L 22  25  23    Calcium 8.6 - 10.2 mg/dL 9.9  9.9  9.9   Total Protein 6.0 - 8.5 g/dL 7.3  7.3  7.2   Total Bilirubin 0.0 - 1.2 mg/dL 0.8  0.8  0.7   Alkaline Phos 44 - 121 IU/L 42  47  40   AST 0 - 40 IU/L 40  41  32   ALT 0 - 44 IU/L 32  33  31      Assessment & Plan:   1) Uncontrolled type 2 diabetes mellitus with CKD  - Patient has currently uncontrolled symptomatic type 2 DM since  72 years of age.  He presents today, accompanied by his wife, with his CGM showing stable, slightly above target glycemic profile overall.  His POCT A1c today is 8.1%, increasing slightly  from last visit of 8%.  He reports he has been more stressed lately.  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  Analysis of his CGM shows TIR 41%, TAR 59%, TBR 0% with a GMI of 8.1%.  - His diabetes is complicated by worsening CKD, obesity/sedentary life and patient remains at a high risk for more acute and chronic complications of diabetes which include CAD, CVA, CKD, retinopathy, and neuropathy. These are all discussed in detail with the patient.  - Nutritional counseling repeated at each appointment due to patients tendency to fall back in to old habits.  - The patient admits there is a room for improvement in their diet and drink choices. -  Suggestion is made for the patient to avoid simple carbohydrates from their diet including Cakes, Sweet Desserts / Pastries, Ice Cream, Soda (diet and regular), Sweet Tea, Candies, Chips, Cookies, Sweet Pastries, Store Bought Juices, Alcohol in Excess of 1-2 drinks a day, Artificial Sweeteners, Coffee Creamer, and "Sugar-free" Products. This will help patient to have stable blood glucose profile and potentially avoid unintended weight gain.   - I encouraged the patient to switch to unprocessed or minimally processed complex starch and increased protein intake (animal or plant source), fruits, and vegetables.   - Patient is advised to stick to a routine mealtimes to eat 3 meals a day and avoid unnecessary snacks (to snack only to correct hypoglycemia).  - I have approached patient with the following individualized plan to manage diabetes and patient agrees:   -He is advised to continue his current regimen with Tresiba 75 units SQ nightly, Metformin 500 mg po daily with breakfast, and Glipizide 5 mg XL daily with breakfast.   -He is encouraged to continue  monitoring blood glucose twice daily (using his CGM), before meals and before bed, and to call the clinic if he has readings less than 70 or greater than 200 for 3 tests  in a row.  He is benefiting from his CGM device, advised to continue.  2) Lipids/HPL:  His most recent lipid panel from 07/26/22 shows controlled LDL of 69 and elevated triglycerides of 212.  He is advised to continue Lipitor 40 mg po daily at bedtime, Fenofibrate 145 mg po daily and Fish Oil supplement 2 g po twice daily.  He is encouraged to stay away from fried foods or red meats.  3) Hypertension:  His blood pressure is controlled to target.  He is encouraged to continue his medications including Lisinopril 5 mg p.o. daily.   4) Vitamin D deficiency -His most recent Vitamin D level was 29.6 on 04/27/22.  He is advised to continue with vitamin D3 supplement 5000 units daily as maintenance.  5) Weight management:  His Body mass index is 31.99 kg/m.--clearly complicating his diabetes management  He is a candidate for modest weight loss.  Carbs and exercise regimen discussed with him.  He is advised to maintain close follow-up with his PMD on routine basis.       I spent  44  minutes in the care of the patient today including review of labs from CMP, Lipids, Thyroid Function, Hematology (current and previous including abstractions from other facilities); face-to-face time discussing  his blood glucose readings/logs, discussing hypoglycemia and hyperglycemia episodes and symptoms, medications doses, his options of short and long term treatment based on the latest standards of care / guidelines;  discussion about incorporating lifestyle medicine;  and documenting the encounter. Risk reduction counseling performed per USPSTF guidelines to reduce obesity and cardiovascular risk factors.     Please refer to Patient Instructions for Blood Glucose Monitoring and Insulin/Medications Dosing Guide"  in media tab for additional information. Please  also refer to " Patient Self Inventory" in the Media  tab for reviewed elements of pertinent patient history.  [name] participated in the  discussions, expressed understanding, and voiced agreement with the above plans.  All questions were answered to his satisfaction. he is encouraged to contact clinic should he have any questions or concerns prior to his return visit.    Follow up plan: - Return in about 3 months (around 01/17/2023) for Diabetes F/U with A1c in office, Bring meter and logs, Previsit labs.   Ronny Bacon, [facility] [address] [place], [place] [address] Phone: [phone] Fax: [phone]  10/17/2022, 10:34 AM

## 2022-12-05 ENCOUNTER — Encounter: Payer: Self-pay | Admitting: *Deleted

## 2022-12-05 ENCOUNTER — Ambulatory Visit: Payer: Self-pay | Admitting: *Deleted

## 2022-12-05 NOTE — Patient Outreach (Signed)
Care Coordination   Follow Up Visit Note   12/05/2022 Name: Javier Walker MRN: [id number] DOB: 07/30/1950  Javier Walker is a 72 y.o. year old male who sees Assunta Found, MD for primary care. I spoke with  Javier Walker by phone today. Patient feels that his medical conditions are well managed at this time and doesn't feel the need for a follow-up call from the Care Management Team. He will reach out to RN Care Coordinator [phone] if any needs arise in the future.   What matters to the patients health and wellness today?  Ongoing self management of blood pressure, blood sugar, and cholesterol.    Goals Addressed             This Visit's Progress    Manage Blood Pressure   On track    Care Coordination Goals: Patient will check and record blood pressure at least 3 times per week Patient will talk blood pressure log to appointments for review Patient will increase activity level as tolerated with a goal of 150 minutes per week of moderate physical activity      Manage Blood Sugar   On track    Care Coordination Goals: Patient will continue monitor blood sugar at least twice daily using the Freestyle Libre Continuous Glucose Monitor and will call endocrinologist with any readings below 70 or with 3 readings in a row above 200 Patient will eat 3 meals per day with 30 GM of CHO and will limit snacking. Snacks should be less than 15 GM of CHO. Patient will increase activity level as tolerated with a goal of at least 150 minutes of moderate activity per week      Manage Cholesterol   On track    Care Coordination Goals: Patient will repeat lipid panel in January 2025 Patient will keep follow-up appointment with endocrinologist in Oct 2024 and will follow-up with PCP at least once a year Patient will increase actiivty level as tolerated with a goal of at least 150 min per week. Consideration given to chronic ankle pain s/p multiple fractures a few years ago Patient will get  out and walk around for at least a few minutes every few hours while driving (drives long distances for work)         SDOH assessments and interventions completed:  Yes  SDOH Interventions Today    Flowsheet Row Most Recent Value  SDOH Interventions   Housing Interventions Intervention Not Indicated  Transportation Interventions Intervention Not Indicated        Care Coordination Interventions:  Yes, provided  Interventions Today    Flowsheet Row Most Recent Value  Chronic Disease   Chronic disease during today's visit Diabetes, Hypertension (HTN), Other  [Hyperlipidemia]  General Interventions   General Interventions Discussed/Reviewed General Interventions Discussed, General Interventions Reviewed, Lipid Profile, Labs, Doctor Visits, Vaccines, Durable Medical Equipment (DME)  Vaccines Flu  Doctor Visits Discussed/Reviewed Doctor Visits Discussed, Doctor Visits Reviewed, PCP, Annual Wellness Visits, Specialist  [reviewed and discussed July office visit with endocrinologist. States that blood sugar levels have been better this month than last month and are stable.]  Durable Medical Equipment (DME) Glucomoter, BP Cuff, Other  [Freestyle Libre Continuous Glucose Monitor: Blood sugar 150 fasting this morning. Does not check blood presure regularly at home. Did not have home readings.]  PCP/Specialist Visits Compliance with follow-up visit  [Follow-up with PCP at least yearly. 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.]  Physical Activity Discussed/Reviewed Physical Activity Discussed, Physical Activity Reviewed  [encouraged increased physical activity as tolerated with a goal of at least 150 minutes per week]  Education Interventions   Education Provided Provided Education  Provided Verbal Education On  Nutrition, Blood Sugar Monitoring, When to see the doctor, Medication, Exercise, Labs  [blood pressure monitoring]  Labs Reviewed Hgb A1c  [10/17/22 A1C 8.1]  Nutrition Interventions   Nutrition Discussed/Reviewed Nutrition Discussed, Nutrition Reviewed, Fluid intake, Carbohydrate meal planning  [Eats a generally health diet. Encouraged 3 meals per day with 30 GM of CHO and up to 2 snacks per day (if needed) with less than 15 GM of CHO.]  Pharmacy Interventions   Pharmacy Dicussed/Reviewed Medications and their functions, Pharmacy Topics Reviewed, Pharmacy Topics Discussed  [Taking medications regularly without any concerns]  Safety Interventions   Safety Discussed/Reviewed Safety Discussed, Safety Reviewed       Follow up plan: No further intervention required.   Encounter Outcome:  Pt. Visit Completed   Demetrios Loll, BSN, RN-BC RN Care Coordinator [facility]  [facility] Direct Dial: [phone] Main #: [phone]

## 2022-12-29 DIAGNOSIS — Z794 Long term (current) use of insulin: Secondary | ICD-10-CM | POA: Diagnosis not present

## 2022-12-29 DIAGNOSIS — E1122 Type 2 diabetes mellitus with diabetic chronic kidney disease: Secondary | ICD-10-CM | POA: Diagnosis not present

## 2022-12-29 DIAGNOSIS — N1831 Chronic kidney disease, stage 3a: Secondary | ICD-10-CM | POA: Diagnosis not present

## 2022-12-30 LAB — COMPREHENSIVE METABOLIC PANEL
ALT: 38 IU/L (ref 0–44)
AST: 43 IU/L — ABNORMAL HIGH (ref 0–40)
Albumin: 4 g/dL (ref 3.8–4.8)
Alkaline Phosphatase: 38 IU/L — ABNORMAL LOW (ref 44–121)
BUN/Creatinine Ratio: 14 (ref 10–24)
BUN: 19 mg/dL (ref 8–27)
Bilirubin Total: 0.5 mg/dL (ref 0.0–1.2)
CO2: 24 mmol/L (ref 20–29)
Calcium: 9.7 mg/dL (ref 8.6–10.2)
Chloride: 103 mmol/L (ref 96–106)
Creatinine, Ser: 1.32 mg/dL — ABNORMAL HIGH (ref 0.76–1.27)
Globulin, Total: 3.1 g/dL (ref 1.5–4.5)
Glucose: 148 mg/dL — ABNORMAL HIGH (ref 70–99)
Potassium: 4.8 mmol/L (ref 3.5–5.2)
Sodium: 141 mmol/L (ref 134–144)
Total Protein: 7.1 g/dL (ref 6.0–8.5)
eGFR: 57 mL/min/{1.73_m2} — ABNORMAL LOW (ref >59)

## 2023-01-10 ENCOUNTER — Other Ambulatory Visit: Payer: Self-pay

## 2023-01-10 MED ORDER — FREESTYLE LIBRE 3 PLUS SENSOR MISC: 2 refills | Status: DC

## 2023-01-18 ENCOUNTER — Encounter: Payer: Self-pay | Admitting: Nurse Practitioner

## 2023-01-18 ENCOUNTER — Ambulatory Visit: Payer: PPO | Admitting: Nurse Practitioner

## 2023-01-18 VITALS — BP 124/70 | HR 72 | Ht 71.0 in | Wt 232.0 lb

## 2023-01-18 DIAGNOSIS — Z794 Long term (current) use of insulin: Secondary | ICD-10-CM

## 2023-01-18 DIAGNOSIS — N1831 Chronic kidney disease, stage 3a: Secondary | ICD-10-CM

## 2023-01-18 DIAGNOSIS — Z7984 Long term (current) use of oral hypoglycemic drugs: Secondary | ICD-10-CM

## 2023-01-18 DIAGNOSIS — I10 Essential (primary) hypertension: Secondary | ICD-10-CM | POA: Diagnosis not present

## 2023-01-18 DIAGNOSIS — E1122 Type 2 diabetes mellitus with diabetic chronic kidney disease: Secondary | ICD-10-CM

## 2023-01-18 DIAGNOSIS — E559 Vitamin D deficiency, unspecified: Secondary | ICD-10-CM | POA: Diagnosis not present

## 2023-01-18 DIAGNOSIS — E782 Mixed hyperlipidemia: Secondary | ICD-10-CM | POA: Diagnosis not present

## 2023-01-18 LAB — POCT UA - MICROALBUMIN

## 2023-01-18 LAB — POCT GLYCOSYLATED HEMOGLOBIN (HGB A1C): Hemoglobin A1C: 8.7 % — AB (ref 4.0–5.6)

## 2023-01-18 MED ORDER — FREESTYLE LIBRE 3 PLUS SENSOR MISC: 3 refills | Status: DC

## 2023-01-18 NOTE — Progress Notes (Signed)
01/18/2023    Endocrinology follow-up note   Subjective:    Patient ID: Javier Walker, male    DOB: 1950-04-22.  He is being seen in follow-up  of management of diabetes, Hypertension, hyperlipidemia.   Past Medical History:  Diagnosis Date   Diabetes mellitus without complication (HCC)    GERD (gastroesophageal reflux disease)    occ-no meds   Hives    patient has hives with anxiety   Hyperlipemia    Hypertension    Shortness of breath    Past Surgical History:  Procedure Laterality Date   CHOLECYSTECTOMY N/A 08/26/2016   Procedure: CHOLECYSTECTOMY;  Surgeon: Franky Macho, MD;  Location: [facility];  Service: General;  Laterality: N/A;   COLON SURGERY  2011   gangrene lower colon with colostomy   COLOSTOMY REVERSAL  2011   ERCP N/A 08/17/2016   Procedure: ENDOSCOPIC RETROGRADE CHOLANGIOPANCREATOGRAPHY (ERCP) Biliary sphincterotomy and stone extraction;  Surgeon: Malissa Hippo, MD;  Location: [facility];  Service: Endoscopy;  Laterality: N/A;   OPEN REDUCTION INTERNAL FIXATION (ORIF) TIBIA/FIBULA FRACTURE Right 08/30/2012   Procedure: OPEN REDUCTION INTERNAL FIXATION (ORIF) TIBIA/FIBULA FRACTURE;  Surgeon: Toni Arthurs, MD;  Location: [facility];  Service: Orthopedics;  Laterality: Right;   WOUND DEBRIDEMENT  2011   abd-   Social History   Socioeconomic History   Marital status: Married    Spouse name: Not on file   Number of children: Not on file   Years of education: Not on file   Highest education level: Not on file  Occupational History   Not on file  Tobacco Use   Smoking status: Former    Current packs/day: 0.00    Average packs/day: 2.0 packs/day for 15.0 years (30.0 ttl pk-yrs)    Types: Cigarettes    Start date: 08/27/1973    Quit date: 08/27/1988    Years since quitting: 34.4   Smokeless tobacco: Never  Vaping Use   Vaping status: Never Used  Substance and Sexual Activity   Alcohol use: Yes    Comment: rarely   Drug use: No    Sexual activity: Yes    Birth control/protection: None  Other Topics Concern   Not on file  Social History Narrative   Not on file   Social Determinants of Health   Financial Resource Strain: Low Risk  (07/28/2022)   Overall Financial Resource Strain (CARDIA)    Difficulty of Paying Living Expenses: Not very hard  Food Insecurity: No Food Insecurity (06/27/2022)   Hunger Vital Sign    Worried About Running Out of Food in the Last Year: Never true    Ran Out of Food in the Last Year: Never true  Transportation Needs: No Transportation Needs (12/05/2022)   PRAPARE - Administrator, Civil Service (Medical): No    Lack of Transportation (Non-Medical): No  Physical Activity: Inactive (06/27/2022)   Exercise Vital Sign    Days of Exercise per Week: 0 days    Minutes of Exercise per Session: 0 min  Stress: Unknown (06/03/2018)   [employer] of Occupational Health - Occupational Stress Questionnaire    Feeling of Stress : Patient declined  Social Connections: Unknown (06/03/2018)   Social Connection and Isolation Panel [NHANES]    Frequency of Communication with Friends and Family: Patient declined    Frequency of Social Gatherings with Friends and Family: Patient declined    Attends Religious Services: Patient declined    Database administrator or Organizations:  Patient declined    Attends Banker Meetings: Patient declined    Marital Status: Patient declined   Outpatient Encounter Medications as of 01/18/2023  Medication Sig   acetaminophen (TYLENOL) 325 MG tablet Take 2 tablets (650 mg total) by mouth every 6 (six) hours as needed for mild pain, fever or headache (or Fever >/= 101).   aspirin EC 81 MG tablet Take 81 mg by mouth daily. Swallow whole.   atorvastatin (LIPITOR) 40 MG tablet Take 1 tablet (40 mg total) by mouth every evening.   Cholecalciferol (VITAMIN D3) 125 MCG (5000 UT) CAPS Take 1 capsule (5,000 Units total) by mouth daily.   fenofibrate  (TRICOR) 145 MG tablet Take 1 tablet by mouth once daily   glipiZIDE (GLUCOTROL XL) 5 MG 24 hr tablet Take 1 tablet (5 mg total) by mouth daily with breakfast.   insulin degludec (TRESIBA FLEXTOUCH) 100 UNIT/ML FlexTouch Pen Inject 75 Units into the skin at bedtime.   Insulin Pen Needle (B-D ULTRAFINE III SHORT PEN) 31G X 8 MM MISC USE 1 EACH DAILY. USE TO INJECT INSULIN DAILY.   lisinopril (ZESTRIL) 5 MG tablet Take 5 mg by mouth daily.   metFORMIN (GLUCOPHAGE) 500 MG tablet Take 1 tablet (500 mg total) by mouth daily with breakfast.   Omega-3 Fatty Acids (FISH OIL) 1200 MG CAPS Take 2,400 mg by mouth daily.   [DISCONTINUED] Continuous Glucose Sensor (FREESTYLE LIBRE 3 PLUS SENSOR) MISC Change sensor every 15 days. E11.65   Continuous Glucose Sensor (FREESTYLE LIBRE 3 PLUS SENSOR) MISC Change sensor every 15 days. E11.65   glucose blood (ONETOUCH VERIO) test strip Use as instructed to monitor glucose twice daily, before breakfast and before bed. (Patient not taking: Reported on 01/18/2023)   No facility-administered encounter medications on file as of 01/18/2023.   ALLERGIES: Allergies  Allergen Reactions   Augmentin [Amoxicillin-Pot Clavulanate] Rash   VACCINATION STATUS: Immunization History  Administered Date(s) Administered   Influenza-Unspecified 02/03/2020    Diabetes He presents for his follow-up diabetic visit. He has type 2 diabetes mellitus. Onset time: He was diagnosed at approximate age of 15 years. His disease course has been stable. There are no hypoglycemic associated symptoms. Pertinent negatives for hypoglycemia include no confusion, pallor or seizures. Pertinent negatives for diabetes include no fatigue, no polydipsia, no polyphagia, no polyuria and no weakness. There are no hypoglycemic complications. Symptoms are stable. Diabetic complications include nephropathy. Risk factors for coronary artery disease include diabetes mellitus, dyslipidemia, hypertension, male sex,  obesity, tobacco exposure and sedentary lifestyle. Current diabetic treatment includes oral agent (dual therapy) and insulin injections. He is compliant with treatment most of the time. His weight is fluctuating minimally. He is following a generally healthy diet. Meal planning includes avoidance of concentrated sweets. He has not had a previous visit with a dietitian. He never participates in exercise. His home blood glucose trend is fluctuating minimally. His overall blood glucose range is 180-200 mg/dl. (He presents today, accompanied by his wife, with his CGM showing stable, slightly above target glycemic profile overall.  His POCT A1c today is 8.7%, increasing slightly from last visit of 8.1%.  Analysis of his CGM shows TIR 25%, TAR 75%, TBR 0% with a GMI of 8.7%.  He notes he does better with his diet if he is at home and when he works he struggles as he tends to eat fast food a lot.) An ACE inhibitor/angiotensin II receptor blocker is being taken. He does not see a podiatrist.Eye exam is  current.  Hyperlipidemia This is a chronic problem. The current episode started more than 1 year ago. The problem is controlled. Recent lipid tests were reviewed and are high. Exacerbating diseases include chronic renal disease, diabetes and obesity. Factors aggravating his hyperlipidemia include fatty foods. Pertinent negatives include no myalgias. Current antihyperlipidemic treatment includes statins and fibric acid derivatives. The current treatment provides mild improvement of lipids. Compliance problems include adherence to diet and adherence to exercise.  Risk factors for coronary artery disease include diabetes mellitus, dyslipidemia, hypertension, male sex, obesity and a sedentary lifestyle.  Hypertension This is a chronic problem. The current episode started more than 1 year ago. The problem has been gradually improving since onset. The problem is controlled. There are no associated agents to hypertension. Risk  factors for coronary artery disease include diabetes mellitus, dyslipidemia, family history, obesity, male gender and sedentary lifestyle. Past treatments include ACE inhibitors. The current treatment provides moderate improvement. Compliance problems include diet.  Hypertensive end-organ damage includes kidney disease. Identifiable causes of hypertension include chronic renal disease.    Review of systems  Constitutional: + Minimally fluctuating body weight,  current Body mass index is 32.36 kg/m. , no fatigue, no subjective hyperthermia, no subjective hypothermia Eyes: no blurry vision, no xerophthalmia ENT: no sore throat, no nodules palpated in throat, no dysphagia/odynophagia, no hoarseness Cardiovascular: no chest pain, no shortness of breath, no palpitations, no leg swelling Respiratory: no cough, no shortness of breath Gastrointestinal: no nausea/vomiting/diarrhea Musculoskeletal: no muscle/joint aches Skin: no rashes, no hyperemia Neurological: no tremors, no numbness, no tingling, no dizziness Psychiatric: no depression, no anxiety   Objective:    BP 124/70 (BP Location: Left Arm, Patient Position: Sitting, Cuff Size: Large)   Pulse 72   Ht 5\' 11"  (1.803 m)   Wt 232 lb (105.2 kg)   BMI 32.36 kg/m   Wt Readings from Last 3 Encounters:  01/18/23 232 lb (105.2 kg)  10/17/22 229 lb 6.4 oz (104.1 kg)  06/07/22 231 lb 6.4 oz (105 kg)    BP Readings from Last 3 Encounters:  01/18/23 124/70  10/17/22 (!) 146/80  06/07/22 138/65    Physical Exam- Limited  Constitutional:  Body mass index is 32.36 kg/m. , not in acute distress, normal state of mind Eyes:  EOMI, no exophthalmos Musculoskeletal: no gross deformities, strength intact in all four extremities, no gross restriction of joint movements Skin:  no rashes, no hyperemia Neurological: no tremor with outstretched hands   Diabetic Foot Exam - Simple   No data filed     Lipid Panel     Component Value Date/Time    CHOL 139 04/27/2022 0828   TRIG 190 (H) 04/27/2022 0828   HDL 22 (L) 04/27/2022 0828   CHOLHDL 6.3 (H) 04/27/2022 0828   CHOLHDL 10.4 (H) 03/04/2020 0709   VLDL 54 (H) 06/03/2018 0930   LDLCALC 84 04/27/2022 0828   LDLCALC  03/04/2020 0709     Comment:     . LDL cholesterol not calculated. Triglyceride levels greater than 400 mg/dL invalidate calculated LDL results. . Reference range: <100 . Desirable range <100 mg/dL for primary prevention;   <70 mg/dL for patients with CHD or diabetic patients  with > or = 2 CHD risk factors. [name] LDL-C is now calculated using the Martin-Hopkins  calculation, which is a validated novel method providing  better accuracy than the Friedewald equation in the  estimation of LDL-C.  Horald Pollen et al. Lenox Ahr. 5784;696(29): 2061-2068  (http://education.[facility].com/faq/FAQ164)  Recent Results (from the past 2160 hour(s))  Comprehensive metabolic panel     Status: Abnormal   Collection Time: 12/29/22  9:20 AM  Result Value Ref Range   Glucose 148 (H) 70 - 99 mg/dL   BUN 19 8 - 27 mg/dL   Creatinine, Ser 4.09 (H) 0.76 - 1.27 mg/dL   eGFR 57 (L) >81 XB/JYN/8.29   BUN/Creatinine Ratio 14 10 - 24   Sodium 141 134 - 144 mmol/L   Potassium 4.8 3.5 - 5.2 mmol/L   Chloride 103 96 - 106 mmol/L   CO2 24 20 - 29 mmol/L   Calcium 9.7 8.6 - 10.2 mg/dL   Total Protein 7.1 6.0 - 8.5 g/dL   Albumin 4.0 3.8 - 4.8 g/dL   Globulin, Total 3.1 1.5 - 4.5 g/dL   Bilirubin Total 0.5 0.0 - 1.2 mg/dL   Alkaline Phosphatase 38 (L) 44 - 121 IU/L   AST 43 (H) 0 - 40 IU/L   ALT 38 0 - 44 IU/L  HgB A1c     Status: Abnormal   Collection Time: 01/18/23  9:47 AM  Result Value Ref Range   Hemoglobin A1C 8.7 (A) 4.0 - 5.6 %   HbA1c POC (<> result, manual entry)     HbA1c, POC (prediabetic range)     HbA1c, POC (controlled diabetic range)    POCT UA - Microalbumin     Status: Normal   Collection Time: 01/18/23  9:47 AM  Result Value Ref Range   Microalbumin Ur,  POC 30mg /L mg/L   Creatinine, POC 300mg /dL mg/dL   Albumin/Creatinine Ratio, Urine, POC 30mg /G           Latest Ref Rng & Units 12/29/2022    9:20 AM 04/27/2022    8:28 AM 04/08/2021    8:28 AM  CMP  Glucose 70 - 99 mg/dL 562  130  865   BUN 8 - 27 mg/dL 19  16  17    Creatinine 0.76 - 1.27 mg/dL 7.84  6.96  2.95   Sodium 134 - 144 mmol/L 141  142  138   Potassium 3.5 - 5.2 mmol/L 4.8  4.9  4.9   Chloride 96 - 106 mmol/L 103  105  99   CO2 20 - 29 mmol/L 24  22  25    Calcium 8.6 - 10.2 mg/dL 9.7  9.9  9.9   Total Protein 6.0 - 8.5 g/dL 7.1  7.3  7.3   Total Bilirubin 0.0 - 1.2 mg/dL 0.5  0.8  0.8   Alkaline Phos 44 - 121 IU/L 38  42  47   AST 0 - 40 IU/L 43  40  41   ALT 0 - 44 IU/L 38  32  33      Assessment & Plan:   1) Uncontrolled type 2 diabetes mellitus with CKD  - Patient has currently uncontrolled symptomatic type 2 DM since  72 years of age.  He presents today, accompanied by his wife, with his CGM showing stable, slightly above target glycemic profile overall.  His POCT A1c today is 8.7%, increasing slightly from last visit of 8.1%.  Analysis of his CGM shows TIR 25%, TAR 75%, TBR 0% with a GMI of 8.7%.  He notes he does better with his diet if he is at home and when he works he struggles as he tends to eat fast food a lot.  - His diabetes is complicated by worsening CKD, obesity/sedentary life and patient remains at a  high risk for more acute and chronic complications of diabetes which include CAD, CVA, CKD, retinopathy, and neuropathy. These are all discussed in detail with the patient.  - Nutritional counseling repeated at each appointment due to patients tendency to fall back in to old habits.  - The patient admits there is a room for improvement in their diet and drink choices. -  Suggestion is made for the patient to avoid simple carbohydrates from their diet including Cakes, Sweet Desserts / Pastries, Ice Cream, Soda (diet and regular), Sweet Tea, Candies,  Chips, Cookies, Sweet Pastries, Store Bought Juices, Alcohol in Excess of 1-2 drinks a day, Artificial Sweeteners, Coffee Creamer, and "Sugar-free" Products. This will help patient to have stable blood glucose profile and potentially avoid unintended weight gain.   - I encouraged the patient to switch to unprocessed or minimally processed complex starch and increased protein intake (animal or plant source), fruits, and vegetables.   - Patient is advised to stick to a routine mealtimes to eat 3 meals a day and avoid unnecessary snacks (to snack only to correct hypoglycemia).  - I have approached patient with the following individualized plan to manage diabetes and patient agrees:   -He is advised to continue his current regimen with Tresiba 75 units SQ nightly, Metformin 500 mg po daily with breakfast, and Glipizide 5 mg XL daily with breakfast.  We did talk about adding a GLP1 product which would be beneficial for cardiovascular risk reduction (he has had CVA in the past) but after discussing the potential side effects, he politely declined at this time.  -He is encouraged to continue monitoring blood glucose twice daily (using his CGM), before meals and before bed, and to call the clinic if he has readings less than 70 or greater than 200 for 3 tests in a row.  He is benefiting from his CGM device, advised to continue.  2) Lipids/HPL:  His most recent lipid panel from 07/26/22 shows controlled LDL of 69 and elevated triglycerides of 212.  He is advised to continue Lipitor 40 mg po daily at bedtime, Fenofibrate 145 mg po daily and Fish Oil supplement 2 g po twice daily.  He is encouraged to stay away from fried foods or red meats.  3) Hypertension:  His blood pressure is controlled to target.  He is encouraged to continue his medications including Lisinopril 5 mg p.o. daily.   4) Vitamin D deficiency -His most recent Vitamin D level was 29.6 on 04/27/22.  He is advised to continue with vitamin D3  supplement 5000 units daily as maintenance.  5) Weight management:  His Body mass index is 32.36 kg/m.--clearly complicating his diabetes management  He is a candidate for modest weight loss.  Carbs and exercise regimen discussed with him.  He is advised to maintain close follow-up with his PMD on routine basis.       I spent  30  minutes in the care of the patient today including review of labs from CMP, Lipids, Thyroid Function, Hematology (current and previous including abstractions from other facilities); face-to-face time discussing  his blood glucose readings/logs, discussing hypoglycemia and hyperglycemia episodes and symptoms, medications doses, his options of short and long term treatment based on the latest standards of care / guidelines;  discussion about incorporating lifestyle medicine;  and documenting the encounter. Risk reduction counseling performed per USPSTF guidelines to reduce obesity and cardiovascular risk factors.     Please refer to Patient Instructions for Blood Glucose Monitoring and Insulin/Medications  Dosing Guide"  in media tab for additional information. Please  also refer to " Patient Self Inventory" in the Media  tab for reviewed elements of pertinent patient history.  [name] participated in the discussions, expressed understanding, and voiced agreement with the above plans.  All questions were answered to his satisfaction. he is encouraged to contact clinic should he have any questions or concerns prior to his return visit.    Follow up plan: - Return in about 3 months (around 04/20/2023) for Diabetes F/U with A1c in office, No previsit labs, Bring meter and logs.   Ronny Bacon, [facility] [address] [place], [place] [address] Phone: [phone] Fax: [phone]  01/18/2023, 10:42 AM

## 2023-03-15 DIAGNOSIS — Z1331 Encounter for screening for depression: Secondary | ICD-10-CM | POA: Diagnosis not present

## 2023-03-15 DIAGNOSIS — I10 Essential (primary) hypertension: Secondary | ICD-10-CM | POA: Diagnosis not present

## 2023-03-15 DIAGNOSIS — E6609 Other obesity due to excess calories: Secondary | ICD-10-CM | POA: Diagnosis not present

## 2023-03-15 DIAGNOSIS — Z0001 Encounter for general adult medical examination with abnormal findings: Secondary | ICD-10-CM | POA: Diagnosis not present

## 2023-03-15 DIAGNOSIS — Z6832 Body mass index (BMI) 32.0-32.9, adult: Secondary | ICD-10-CM | POA: Diagnosis not present

## 2023-03-15 DIAGNOSIS — E782 Mixed hyperlipidemia: Secondary | ICD-10-CM | POA: Diagnosis not present

## 2023-03-15 DIAGNOSIS — E1140 Type 2 diabetes mellitus with diabetic neuropathy, unspecified: Secondary | ICD-10-CM | POA: Diagnosis not present

## 2023-04-10 ENCOUNTER — Other Ambulatory Visit: Payer: Self-pay | Admitting: Nurse Practitioner

## 2023-04-20 ENCOUNTER — Ambulatory Visit: Payer: PPO | Admitting: Nurse Practitioner

## 2023-05-17 ENCOUNTER — Other Ambulatory Visit: Payer: Self-pay | Admitting: Nurse Practitioner

## 2023-06-05 ENCOUNTER — Other Ambulatory Visit: Payer: Self-pay | Admitting: Nurse Practitioner

## 2023-06-07 ENCOUNTER — Ambulatory Visit: Payer: PPO | Admitting: Nurse Practitioner

## 2023-06-20 ENCOUNTER — Other Ambulatory Visit: Payer: Self-pay | Admitting: Nurse Practitioner

## 2023-06-30 ENCOUNTER — Telehealth: Payer: Self-pay

## 2023-06-30 ENCOUNTER — Encounter: Payer: Self-pay | Admitting: Nurse Practitioner

## 2023-06-30 ENCOUNTER — Ambulatory Visit: Payer: PPO | Admitting: Nurse Practitioner

## 2023-06-30 VITALS — BP 134/72 | HR 73 | Ht 71.0 in | Wt 230.2 lb

## 2023-06-30 DIAGNOSIS — Z7984 Long term (current) use of oral hypoglycemic drugs: Secondary | ICD-10-CM | POA: Diagnosis not present

## 2023-06-30 DIAGNOSIS — Z7985 Long-term (current) use of injectable non-insulin antidiabetic drugs: Secondary | ICD-10-CM | POA: Diagnosis not present

## 2023-06-30 DIAGNOSIS — E782 Mixed hyperlipidemia: Secondary | ICD-10-CM

## 2023-06-30 DIAGNOSIS — N1831 Chronic kidney disease, stage 3a: Secondary | ICD-10-CM

## 2023-06-30 DIAGNOSIS — Z794 Long term (current) use of insulin: Secondary | ICD-10-CM | POA: Diagnosis not present

## 2023-06-30 DIAGNOSIS — I10 Essential (primary) hypertension: Secondary | ICD-10-CM | POA: Diagnosis not present

## 2023-06-30 DIAGNOSIS — E559 Vitamin D deficiency, unspecified: Secondary | ICD-10-CM

## 2023-06-30 DIAGNOSIS — E1122 Type 2 diabetes mellitus with diabetic chronic kidney disease: Secondary | ICD-10-CM | POA: Diagnosis not present

## 2023-06-30 LAB — POCT GLYCOSYLATED HEMOGLOBIN (HGB A1C): Hemoglobin A1C: 8.3 % — AB (ref 4.0–5.6)

## 2023-06-30 MED ORDER — TIRZEPATIDE 5 MG/0.5ML ~~LOC~~ SOAJ: 5.0000 mg | SUBCUTANEOUS | 1 refills | Status: DC

## 2023-06-30 MED ORDER — TRESIBA FLEXTOUCH 100 UNIT/ML ~~LOC~~ SOPN: 75.0000 [IU] | PEN_INJECTOR | Freq: Every day | SUBCUTANEOUS | 3 refills | Status: DC

## 2023-06-30 MED ORDER — FREESTYLE LIBRE 3 PLUS SENSOR MISC: 3 refills | Status: DC

## 2023-06-30 MED ORDER — METFORMIN HCL 500 MG PO TABS: 500.0000 mg | ORAL_TABLET | Freq: Every day | ORAL | 3 refills | Status: DC

## 2023-06-30 MED ORDER — TIRZEPATIDE 2.5 MG/0.5ML ~~LOC~~ SOAJ: 2.5000 mg | SUBCUTANEOUS | 0 refills | Status: DC

## 2023-06-30 NOTE — Telephone Encounter (Signed)
 Pharmacy Patient Advocate Encounter   Received notification from CoverMyMeds that prior authorization for [facility] is required/requested.   Insurance verification completed.   The patient is insured through [facility] ADVANT[employer]/RX ADVANCE .   Per test claim: PA required; PA submitted to above mentioned insurance via CoverMyMeds Key/confirmation #/EOC [id number] Status is pending

## 2023-06-30 NOTE — Progress Notes (Signed)
 06/30/2023    Endocrinology follow-up note   Subjective:    Patient ID: Javier Walker, male    DOB: [birth year].  He is being seen in follow-up  of management of diabetes, Hypertension, hyperlipidemia.   Past Medical History:  Diagnosis Date   Diabetes mellitus without complication (HCC)    GERD (gastroesophageal reflux disease)    occ-no meds   Hives    patient has hives with anxiety   Hyperlipemia    Hypertension    Shortness of breath    Past Surgical History:  Procedure Laterality Date   CHOLECYSTECTOMY N/A 08/26/2016   Procedure: CHOLECYSTECTOMY;  Surgeon: Franky Macho, MD;  Location: [facility];  Service: General;  Laterality: N/A;   COLON SURGERY  2011   gangrene lower colon with colostomy   COLOSTOMY REVERSAL  2011   ERCP N/A 08/17/2016   Procedure: ENDOSCOPIC RETROGRADE CHOLANGIOPANCREATOGRAPHY (ERCP) Biliary sphincterotomy and stone extraction;  Surgeon: Malissa Hippo, MD;  Location: [facility];  Service: Endoscopy;  Laterality: N/A;   OPEN REDUCTION INTERNAL FIXATION (ORIF) TIBIA/FIBULA FRACTURE Right 08/30/2012   Procedure: OPEN REDUCTION INTERNAL FIXATION (ORIF) TIBIA/FIBULA FRACTURE;  Surgeon: Toni Arthurs, MD;  Location: [facility];  Service: Orthopedics;  Laterality: Right;   WOUND DEBRIDEMENT  2011   abd-   Social History   Socioeconomic History   Marital status: Married    Spouse name: Not on file   Number of children: Not on file   Years of education: Not on file   Highest education level: Not on file  Occupational History   Not on file  Tobacco Use   Smoking status: Former    Current packs/day: 0.00    Average packs/day: 2.0 packs/day for 15.0 years (30.0 ttl pk-yrs)    Types: Cigarettes    Start date: 08/27/1973    Quit date: 08/27/1988    Years since quitting: 34.8   Smokeless tobacco: Never  Vaping Use   Vaping status: Never Used  Substance and Sexual Activity   Alcohol use: Yes    Comment: rarely   Drug use: No    Sexual activity: Yes    Birth control/protection: None  Other Topics Concern   Not on file  Social History Narrative   Not on file   Social Drivers of Health   Financial Resource Strain: Low Risk  (07/28/2022)   Overall Financial Resource Strain (CARDIA)    Difficulty of Paying Living Expenses: Not very hard  Food Insecurity: No Food Insecurity (06/27/2022)   Hunger Vital Sign    Worried About Running Out of Food in the Last Year: Never true    Ran Out of Food in the Last Year: Never true  Transportation Needs: No Transportation Needs (12/05/2022)   PRAPARE - Administrator, Civil Service (Medical): No    Lack of Transportation (Non-Medical): No  Physical Activity: Inactive (06/27/2022)   Exercise Vital Sign    Days of Exercise per Week: 0 days    Minutes of Exercise per Session: 0 min  Stress: Unknown (06/03/2018)   [employer] of Occupational Health - Occupational Stress Questionnaire    Feeling of Stress : Patient declined  Social Connections: Unknown (06/03/2018)   Social Connection and Isolation Panel [NHANES]    Frequency of Communication with Friends and Family: Patient declined    Frequency of Social Gatherings with Friends and Family: Patient declined    Attends Religious Services: Patient declined    Database administrator or Organizations:  Patient declined    Attends Banker Meetings: Patient declined    Marital Status: Patient declined   Outpatient Encounter Medications as of 06/30/2023  Medication Sig   acetaminophen (TYLENOL) 325 MG tablet Take 2 tablets (650 mg total) by mouth every 6 (six) hours as needed for mild pain, fever or headache (or Fever >/= 101).   aspirin EC 81 MG tablet Take 81 mg by mouth daily. Swallow whole.   atorvastatin (LIPITOR) 40 MG tablet Take 1 tablet (40 mg total) by mouth every evening.   B-D ULTRAFINE III SHORT PEN 31G X 8 MM MISC USE 1 EACH DAILY. USE TO INJECT INSULIN DAILY.   Cholecalciferol (VITAMIN D3)  125 MCG (5000 UT) CAPS Take 1 capsule (5,000 Units total) by mouth daily.   fenofibrate (TRICOR) 145 MG tablet Take 1 tablet by mouth once daily   glipiZIDE (GLUCOTROL XL) 5 MG 24 hr tablet Take 1 tablet (5 mg total) by mouth daily with breakfast. Take 1 tablet ( 5 mg total) by mouth daily with breakfast   glucose blood (ONETOUCH VERIO) test strip Use as instructed to monitor glucose twice daily, before breakfast and before bed.   lisinopril (ZESTRIL) 5 MG tablet Take 5 mg by mouth daily.   Omega-3 Fatty Acids (FISH OIL) 1200 MG CAPS Take 2,400 mg by mouth daily.   tirzepatide [facility]) 2.5 MG/0.5ML Pen Inject 2.5 mg into the skin once a week.   [START ON 07/28/2023] tirzepatide (MOUNJARO) 5 MG/0.5ML Pen Inject 5 mg into the skin once a week.   [DISCONTINUED] Continuous Glucose Sensor (FREESTYLE LIBRE 3 PLUS SENSOR) MISC CHECK GLUCOSE CONTINUOUSLY AND CHANGE SENSOR EVERY 15 DAYS.   [DISCONTINUED] insulin degludec (TRESIBA FLEXTOUCH) 100 UNIT/ML FlexTouch Pen Inject 75 Units into the skin at bedtime.   [DISCONTINUED] metFORMIN (GLUCOPHAGE) 500 MG tablet Take 1 tablet (500 mg total) by mouth daily with breakfast.   Continuous Glucose Sensor (FREESTYLE LIBRE 3 PLUS SENSOR) MISC CHECK GLUCOSE CONTINUOUSLY AND CHANGE SENSOR EVERY 15 DAYS.   insulin degludec (TRESIBA FLEXTOUCH) 100 UNIT/ML FlexTouch Pen Inject 75 Units into the skin at bedtime.   metFORMIN (GLUCOPHAGE) 500 MG tablet Take 1 tablet (500 mg total) by mouth daily with breakfast.   No facility-administered encounter medications on file as of 06/30/2023.   ALLERGIES: Allergies  Allergen Reactions   Augmentin [Amoxicillin-Pot Clavulanate] Rash   VACCINATION STATUS: Immunization History  Administered Date(s) Administered   Influenza-Unspecified 02/03/2020    Diabetes He presents for his follow-up diabetic visit. He has type 2 diabetes mellitus. Onset time: He was diagnosed at approximate age of 32 years. His disease course has been  improving. There are no hypoglycemic associated symptoms. Pertinent negatives for hypoglycemia include no confusion, pallor or seizures. Pertinent negatives for diabetes include no fatigue, no polydipsia, no polyphagia, no polyuria and no weakness. There are no hypoglycemic complications. Symptoms are stable. Diabetic complications include nephropathy. Risk factors for coronary artery disease include diabetes mellitus, dyslipidemia, hypertension, male sex, obesity, tobacco exposure and sedentary lifestyle. Current diabetic treatment includes oral agent (dual therapy) and insulin injections. He is compliant with treatment most of the time. His weight is fluctuating minimally. He is following a generally healthy diet. Meal planning includes avoidance of concentrated sweets. He has not had a previous visit with a dietitian. He never participates in exercise. His home blood glucose trend is fluctuating minimally. His overall blood glucose range is 180-200 mg/dl. (He presents today, accompanied by his wife, with his CGM showing stable, slightly  above target glycemic profile overall.  His POCT A1c today is 8.3%, improving slightly from last visit of 8.7%.  Analysis of his CGM shows TIR 47%, TAR 53%, TBR 0% with a GMI of 7.8%.  ) An ACE inhibitor/angiotensin II receptor blocker is being taken. He does not see a podiatrist.Eye exam is current.  Hyperlipidemia This is a chronic problem. The current episode started more than 1 year ago. The problem is controlled. Recent lipid tests were reviewed and are high. Exacerbating diseases include chronic renal disease, diabetes and obesity. Factors aggravating his hyperlipidemia include fatty foods. Pertinent negatives include no myalgias. Current antihyperlipidemic treatment includes statins and fibric acid derivatives. The current treatment provides mild improvement of lipids. Compliance problems include adherence to diet and adherence to exercise.  Risk factors for coronary  artery disease include diabetes mellitus, dyslipidemia, hypertension, male sex, obesity and a sedentary lifestyle.  Hypertension This is a chronic problem. The current episode started more than 1 year ago. The problem has been gradually improving since onset. The problem is controlled. There are no associated agents to hypertension. Risk factors for coronary artery disease include diabetes mellitus, dyslipidemia, family history, obesity, male gender and sedentary lifestyle. Past treatments include ACE inhibitors. The current treatment provides moderate improvement. Compliance problems include diet.  Hypertensive end-organ damage includes kidney disease. Identifiable causes of hypertension include chronic renal disease.    Review of systems  Constitutional: + Minimally fluctuating body weight,  current Body mass index is 32.11 kg/m. , no fatigue, no subjective hyperthermia, no subjective hypothermia Eyes: no blurry vision, no xerophthalmia ENT: no sore throat, no nodules palpated in throat, no dysphagia/odynophagia, no hoarseness Cardiovascular: no chest pain, no shortness of breath, no palpitations, no leg swelling Respiratory: no cough, no shortness of breath Gastrointestinal: no nausea/vomiting/diarrhea Musculoskeletal: no muscle/joint aches Skin: no rashes, no hyperemia Neurological: no tremors, no numbness, no tingling, no dizziness Psychiatric: no depression, no anxiety   Objective:    BP 134/72 (BP Location: Right Arm, Patient Position: Sitting, Cuff Size: Large)   Ht 5\' 11"  (1.803 m)   Wt 230 lb 3.2 oz (104.4 kg)   BMI 32.11 kg/m   Wt Readings from Last 3 Encounters:  06/30/23 230 lb 3.2 oz (104.4 kg)  01/18/23 232 lb (105.2 kg)  10/17/22 229 lb 6.4 oz (104.1 kg)    BP Readings from Last 3 Encounters:  06/30/23 134/72  01/18/23 124/70  10/17/22 (!) 146/80     Physical Exam- Limited  Constitutional:  Body mass index is 32.11 kg/m. , not in acute distress, normal state  of mind Eyes:  EOMI, no exophthalmos Musculoskeletal: no gross deformities, strength intact in all four extremities, no gross restriction of joint movements Skin:  no rashes, no hyperemia Neurological: no tremor with outstretched hands   Diabetic Foot Exam - Simple   No data filed     Lipid Panel     Component Value Date/Time   CHOL 139 04/27/2022 0828   TRIG 190 (H) 04/27/2022 0828   HDL 22 (L) 04/27/2022 0828   CHOLHDL 6.3 (H) 04/27/2022 0828   CHOLHDL 10.4 (H) 03/04/2020 0709   VLDL 54 (H) 06/03/2018 0930   LDLCALC 84 04/27/2022 0828   LDLCALC  03/04/2020 0709     Comment:     . LDL cholesterol not calculated. Triglyceride levels greater than 400 mg/dL invalidate calculated LDL results. . Reference range: <100 . Desirable range <100 mg/dL for primary prevention;   <70 mg/dL for patients with CHD or  diabetic patients  with > or = 2 CHD risk factors. [name] LDL-C is now calculated using the Martin-Hopkins  calculation, which is a validated novel method providing  better accuracy than the Friedewald equation in the  estimation of LDL-C.  Horald Pollen et al. Lenox Ahr. 2130;865(78): 2061-2068  (http://education.[facility].com/faq/FAQ164)    Recent Results (from the past 2160 hours)  HgB A1c     Status: Abnormal   Collection Time: 06/30/23  8:19 AM  Result Value Ref Range   Hemoglobin A1C 8.3 (A) 4.0 - 5.6 %   HbA1c POC (<> result, manual entry)     HbA1c, POC (prediabetic range)     HbA1c, POC (controlled diabetic range)             Latest Ref Rng & Units 12/29/2022    9:20 AM 04/27/2022    8:28 AM 04/08/2021    8:28 AM  CMP  Glucose 70 - 99 mg/dL 469  629  528   BUN 8 - 27 mg/dL 19  16  17    Creatinine 0.76 - 1.27 mg/dL 4.13  2.44  0.10   Sodium 134 - 144 mmol/L 141  142  138   Potassium 3.5 - 5.2 mmol/L 4.8  4.9  4.9   Chloride 96 - 106 mmol/L 103  105  99   CO2 20 - 29 mmol/L 24  22  25    Calcium 8.6 - 10.2 mg/dL 9.7  9.9  9.9   Total Protein 6.0 - 8.5  g/dL 7.1  7.3  7.3   Total Bilirubin 0.0 - 1.2 mg/dL 0.5  0.8  0.8   Alkaline Phos 44 - 121 IU/L 38  42  47   AST 0 - 40 IU/L 43  40  41   ALT 0 - 44 IU/L 38  32  33      Assessment & Plan:   1) Uncontrolled type 2 diabetes mellitus with CKD  - Patient has currently uncontrolled symptomatic type 2 DM since  73 years of age.  He presents today, accompanied by his wife, with his CGM showing stable, slightly above target glycemic profile overall.  His POCT A1c today is 8.3%, improving slightly from last visit of 8.7%.  Analysis of his CGM shows TIR 47%, TAR 53%, TBR 0% with a GMI of 7.8%.    - His diabetes is complicated by worsening CKD, obesity/sedentary life and patient remains at a high risk for more acute and chronic complications of diabetes which include CAD, CVA, CKD, retinopathy, and neuropathy. These are all discussed in detail with the patient.  - Nutritional counseling repeated at each appointment due to patients tendency to fall back in to old habits.  - The patient admits there is a room for improvement in their diet and drink choices. -  Suggestion is made for the patient to avoid simple carbohydrates from their diet including Cakes, Sweet Desserts / Pastries, Ice Cream, Soda (diet and regular), Sweet Tea, Candies, Chips, Cookies, Sweet Pastries, Store Bought Juices, Alcohol in Excess of 1-2 drinks a day, Artificial Sweeteners, Coffee Creamer, and "Sugar-free" Products. This will help patient to have stable blood glucose profile and potentially avoid unintended weight gain.   - I encouraged the patient to switch to unprocessed or minimally processed complex starch and increased protein intake (animal or plant source), fruits, and vegetables.   - Patient is advised to stick to a routine mealtimes to eat 3 meals a day and avoid unnecessary snacks (to snack only to  correct hypoglycemia).  - I have approached patient with the following individualized plan to manage diabetes and  patient agrees:   -He is advised to continue Tresiba 75 units SQ nightly and Metformin 500 mg po daily with breakfast.  Will trial him on Mounjaro 2.5 mg SQ weekly x 1 month, then increase to 5 mg SQ weekly thereafter.  Once he starts his medication, he will stop his Glipizide (to avoid potential for hypoglycemia).  He is excellent candidate for GIP therapy given history of CVA in the past but he is worried about GI SE.  We discussed strategies to avoid GI upset with this product.    -He is encouraged to continue monitoring blood glucose twice daily (using his CGM), before meals and before bed, and to call the clinic if he has readings less than 70 or greater than 200 for 3 tests in a row.  He is benefiting from his CGM device, advised to continue.  2) Lipids/HPL:  His most recent lipid panel from 07/26/22 shows controlled LDL of 69 and elevated triglycerides of 212.  He is advised to continue Lipitor 40 mg po daily at bedtime, Fenofibrate 145 mg po daily and Fish Oil supplement 2 g po twice daily.  He is encouraged to stay away from fried foods or red meats.  3) Hypertension:  His blood pressure is controlled to target.  He is encouraged to continue his medications including Lisinopril 5 mg p.o. daily.   4) Vitamin D deficiency -His most recent Vitamin D level was 29.6 on 04/27/22.  He is advised to continue with vitamin D3 supplement 5000 units daily as maintenance.  5) Weight management:  His Body mass index is 32.11 kg/m.--clearly complicating his diabetes management  He is a candidate for modest weight loss.  Carbs and exercise regimen discussed with him.  He is advised to maintain close follow-up with his PMD on routine basis.      I spent  57  minutes in the care of the patient today including review of labs from CMP, Lipids, Thyroid Function, Hematology (current and previous including abstractions from other facilities); face-to-face time discussing  his blood glucose readings/logs,  discussing hypoglycemia and hyperglycemia episodes and symptoms, medications doses, his options of short and long term treatment based on the latest standards of care / guidelines;  discussion about incorporating lifestyle medicine;  and documenting the encounter. Risk reduction counseling performed per USPSTF guidelines to reduce obesity and cardiovascular risk factors.     Please refer to Patient Instructions for Blood Glucose Monitoring and Insulin/Medications Dosing Guide"  in media tab for additional information. Please  also refer to " Patient Self Inventory" in the Media  tab for reviewed elements of pertinent patient history.  [name] participated in the discussions, expressed understanding, and voiced agreement with the above plans.  All questions were answered to his satisfaction. he is encouraged to contact clinic should he have any questions or concerns prior to his return visit.    Follow up plan: - Return for Diabetes F/U with A1c in office, No previsit labs, Bring meter and logs.   Ronny Bacon, [facility] [address] [place], [place] [address] Phone: [phone] Fax: [phone]  06/30/2023, 8:38 AM

## 2023-07-05 ENCOUNTER — Other Ambulatory Visit (HOSPITAL_COMMUNITY): Payer: Self-pay

## 2023-07-21 ENCOUNTER — Other Ambulatory Visit (HOSPITAL_COMMUNITY): Payer: Self-pay

## 2023-07-21 NOTE — Telephone Encounter (Signed)
 Pharmacy Patient Advocate Encounter  Received notification from [facility] [employer] that Prior Authorization for Javier Walker has been APPROVED through 07/20/24

## 2023-07-24 NOTE — Telephone Encounter (Signed)
 Patient was called and made aware.

## 2023-10-05 ENCOUNTER — Ambulatory Visit (INDEPENDENT_AMBULATORY_CARE_PROVIDER_SITE_OTHER): Admitting: Nurse Practitioner

## 2023-10-05 ENCOUNTER — Encounter: Payer: Self-pay | Admitting: Nurse Practitioner

## 2023-10-05 VITALS — BP 120/68 | HR 76 | Ht 71.0 in | Wt 221.6 lb

## 2023-10-05 DIAGNOSIS — Z7984 Long term (current) use of oral hypoglycemic drugs: Secondary | ICD-10-CM

## 2023-10-05 DIAGNOSIS — E1122 Type 2 diabetes mellitus with diabetic chronic kidney disease: Secondary | ICD-10-CM | POA: Diagnosis not present

## 2023-10-05 DIAGNOSIS — I10 Essential (primary) hypertension: Secondary | ICD-10-CM | POA: Diagnosis not present

## 2023-10-05 DIAGNOSIS — Z794 Long term (current) use of insulin: Secondary | ICD-10-CM | POA: Diagnosis not present

## 2023-10-05 DIAGNOSIS — N1831 Chronic kidney disease, stage 3a: Secondary | ICD-10-CM

## 2023-10-05 DIAGNOSIS — E782 Mixed hyperlipidemia: Secondary | ICD-10-CM | POA: Diagnosis not present

## 2023-10-05 DIAGNOSIS — E559 Vitamin D deficiency, unspecified: Secondary | ICD-10-CM | POA: Diagnosis not present

## 2023-10-05 LAB — POCT GLYCOSYLATED HEMOGLOBIN (HGB A1C): Hemoglobin A1C: 6.3 % — AB (ref 4.0–5.6)

## 2023-10-05 MED ORDER — TIRZEPATIDE 5 MG/0.5ML ~~LOC~~ SOAJ: 5.0000 mg | SUBCUTANEOUS | 1 refills | Status: DC

## 2023-10-05 MED ORDER — METFORMIN HCL 500 MG PO TABS: 500.0000 mg | ORAL_TABLET | Freq: Every day | ORAL | 3 refills | Status: AC

## 2023-10-05 MED ORDER — TRESIBA FLEXTOUCH 100 UNIT/ML ~~LOC~~ SOPN: 65.0000 [IU] | PEN_INJECTOR | Freq: Every day | SUBCUTANEOUS | 3 refills | Status: DC

## 2023-10-05 NOTE — Progress Notes (Signed)
 10/05/2023    Endocrinology follow-up note   Subjective:    Patient ID: Javier Walker, male    DOB: [birth year].  He is being seen in follow-up  of management of diabetes, Hypertension, hyperlipidemia.   Past Medical History:  Diagnosis Date   Diabetes mellitus without complication (HCC)    GERD (gastroesophageal reflux disease)    occ-no meds   Hives    patient has hives with anxiety   Hyperlipemia    Hypertension    Shortness of breath    Past Surgical History:  Procedure Laterality Date   CHOLECYSTECTOMY N/A 08/26/2016   Procedure: CHOLECYSTECTOMY;  Surgeon: Mavis Anes, MD;  Location: [facility];  Service: General;  Laterality: N/A;   COLON SURGERY  2011   gangrene lower colon with colostomy   COLOSTOMY REVERSAL  2011   ERCP N/A 08/17/2016   Procedure: ENDOSCOPIC RETROGRADE CHOLANGIOPANCREATOGRAPHY (ERCP) Biliary sphincterotomy and stone extraction;  Surgeon: Golda Claudis PENNER, MD;  Location: [facility];  Service: Endoscopy;  Laterality: N/A;   OPEN REDUCTION INTERNAL FIXATION (ORIF) TIBIA/FIBULA FRACTURE Right 08/30/2012   Procedure: OPEN REDUCTION INTERNAL FIXATION (ORIF) TIBIA/FIBULA FRACTURE;  Surgeon: Norleen Armor, MD;  Location: [facility];  Service: Orthopedics;  Laterality: Right;   WOUND DEBRIDEMENT  2011   abd-   Social History   Socioeconomic History   Marital status: Married    Spouse name: Not on file   Number of children: Not on file   Years of education: Not on file   Highest education level: Not on file  Occupational History   Not on file  Tobacco Use   Smoking status: Former    Current packs/day: 0.00    Average packs/day: 2.0 packs/day for 15.0 years (30.0 ttl pk-yrs)    Types: Cigarettes    Start date: 08/27/1973    Quit date: 08/27/1988    Years since quitting: 35.1   Smokeless tobacco: Never  Vaping Use   Vaping status: Never Used  Substance and Sexual Activity   Alcohol use: Yes    Comment: rarely   Drug use: No    Sexual activity: Yes    Birth control/protection: None  Other Topics Concern   Not on file  Social History Narrative   Not on file   Social Drivers of Health   Financial Resource Strain: Low Risk  (07/28/2022)   Overall Financial Resource Strain (CARDIA)    Difficulty of Paying Living Expenses: Not very hard  Food Insecurity: No Food Insecurity (06/27/2022)   Hunger Vital Sign    Worried About Running Out of Food in the Last Year: Never true    Ran Out of Food in the Last Year: Never true  Transportation Needs: No Transportation Needs (12/05/2022)   PRAPARE - Administrator, Civil Service (Medical): No    Lack of Transportation (Non-Medical): No  Physical Activity: Inactive (06/27/2022)   Exercise Vital Sign    Days of Exercise per Week: 0 days    Minutes of Exercise per Session: 0 min  Stress: Unknown (06/03/2018)   [employer] of Occupational Health - Occupational Stress Questionnaire    Feeling of Stress : Patient declined  Social Connections: Unknown (06/03/2018)   Social Connection and Isolation Panel    Frequency of Communication with Friends and Family: Patient declined    Frequency of Social Gatherings with Friends and Family: Patient declined    Attends Religious Services: Patient declined    Database administrator or Organizations: Patient  declined    Attends Banker Meetings: Patient declined    Marital Status: Patient declined   Outpatient Encounter Medications as of 10/05/2023  Medication Sig   acetaminophen  (TYLENOL ) 325 MG tablet Take 2 tablets (650 mg total) by mouth every 6 (six) hours as needed for mild pain, fever or headache (or Fever >/= 101).   aspirin  EC 81 MG tablet Take 81 mg by mouth daily. Swallow whole.   atorvastatin  (LIPITOR) 40 MG tablet Take 1 tablet (40 mg total) by mouth every evening.   B-D ULTRAFINE III SHORT PEN 31G X 8 MM MISC USE 1 EACH DAILY. USE TO INJECT INSULIN  DAILY.   Cholecalciferol  (VITAMIN D3) 125 MCG  (5000 UT) CAPS Take 1 capsule (5,000 Units total) by mouth daily.   Continuous Glucose Sensor (FREESTYLE LIBRE 3 PLUS SENSOR) MISC CHECK GLUCOSE CONTINUOUSLY AND CHANGE SENSOR EVERY 15 DAYS.   fenofibrate  (TRICOR ) 145 MG tablet Take 1 tablet by mouth once daily   glucose blood (ONETOUCH VERIO) test strip Use as instructed to monitor glucose twice daily, before breakfast and before bed.   lisinopril  (ZESTRIL ) 5 MG tablet Take 5 mg by mouth daily.   Omega-3 Fatty Acids  (FISH OIL ) 1200 MG CAPS Take 2,400 mg by mouth daily.   [DISCONTINUED] glipiZIDE  (GLUCOTROL  XL) 5 MG 24 hr tablet Take 1 tablet (5 mg total) by mouth daily with breakfast. Take 1 tablet ( 5 mg total) by mouth daily with breakfast   [DISCONTINUED] insulin  degludec (TRESIBA  FLEXTOUCH) 100 UNIT/ML FlexTouch Pen Inject 75 Units into the skin at bedtime.   [DISCONTINUED] metFORMIN  (GLUCOPHAGE ) 500 MG tablet Take 1 tablet (500 mg total) by mouth daily with breakfast.   [DISCONTINUED] tirzepatide  (MOUNJARO ) 2.5 MG/0.5ML Pen Inject 2.5 mg into the skin once a week.   [DISCONTINUED] tirzepatide  (MOUNJARO ) 5 MG/0.5ML Pen Inject 5 mg into the skin once a week.   insulin  degludec (TRESIBA  FLEXTOUCH) 100 UNIT/ML FlexTouch Pen Inject 65 Units into the skin at bedtime.   metFORMIN  (GLUCOPHAGE ) 500 MG tablet Take 1 tablet (500 mg total) by mouth daily with breakfast.   tirzepatide  (MOUNJARO ) 5 MG/0.5ML Pen Inject 5 mg into the skin once a week.   No facility-administered encounter medications on file as of 10/05/2023.   ALLERGIES: Allergies  Allergen Reactions   Augmentin  [Amoxicillin -Pot Clavulanate] Rash   VACCINATION STATUS: Immunization History  Administered Date(s) Administered   Influenza-Unspecified 02/03/2020    Diabetes He presents for his follow-up diabetic visit. He has type 2 diabetes mellitus. Onset time: He was diagnosed at approximate age of 68 years. His disease course has been improving. There are no hypoglycemic associated  symptoms. Pertinent negatives for hypoglycemia include no confusion, pallor or seizures. Associated symptoms include weight loss. Pertinent negatives for diabetes include no fatigue, no polydipsia, no polyphagia, no polyuria and no weakness. There are no hypoglycemic complications. Symptoms are stable. Diabetic complications include nephropathy. Risk factors for coronary artery disease include diabetes mellitus, dyslipidemia, hypertension, male sex, obesity, tobacco exposure and sedentary lifestyle. Current diabetic treatment includes insulin  injections and oral agent (monotherapy) (and Mounjaro ). He is compliant with treatment most of the time. His weight is fluctuating minimally. He is following a generally healthy diet. Meal planning includes avoidance of concentrated sweets. He has not had a previous visit with a dietitian. He never participates in exercise. His home blood glucose trend is decreasing steadily. His overall blood glucose range is 130-140 mg/dl. (He presents today, accompanied by his wife, with his CGM showing at goal  glycemic profile overall.  His POCT A1c today is 6.3%, improving from last visit of 8.3%.  Analysis of his CGM shows TIR 90%, TAR 9%, TBR 1% with a GMI of 6.4%.  He has tolerated the Mounjaro  well, only mild side effects.) An ACE inhibitor/angiotensin II receptor blocker is being taken. He does not see a podiatrist.Eye exam is current.  Hyperlipidemia This is a chronic problem. The current episode started more than 1 year ago. The problem is controlled. Recent lipid tests were reviewed and are high. Exacerbating diseases include chronic renal disease, diabetes and obesity. Factors aggravating his hyperlipidemia include fatty foods. Pertinent negatives include no myalgias. Current antihyperlipidemic treatment includes statins and fibric acid derivatives. The current treatment provides mild improvement of lipids. Compliance problems include adherence to diet and adherence to exercise.   Risk factors for coronary artery disease include diabetes mellitus, dyslipidemia, hypertension, male sex, obesity and a sedentary lifestyle.  Hypertension This is a chronic problem. The current episode started more than 1 year ago. The problem has been gradually improving since onset. The problem is controlled. There are no associated agents to hypertension. Risk factors for coronary artery disease include diabetes mellitus, dyslipidemia, family history, obesity, male gender and sedentary lifestyle. Past treatments include ACE inhibitors. The current treatment provides moderate improvement. Compliance problems include diet.  Hypertensive end-organ damage includes kidney disease. Identifiable causes of hypertension include chronic renal disease.    Review of systems  Constitutional: + decreasing body weight,  current Body mass index is 30.91 kg/m. , no fatigue, no subjective hyperthermia, no subjective hypothermia Eyes: no blurry vision, no xerophthalmia ENT: no sore throat, no nodules palpated in throat, no dysphagia/odynophagia, no hoarseness Cardiovascular: no chest pain, no shortness of breath, no palpitations, no leg swelling Respiratory: no cough, no shortness of breath Gastrointestinal: no nausea/vomiting/diarrhea Musculoskeletal: no muscle/joint aches Skin: no rashes, no hyperemia Neurological: no tremors, no numbness, no tingling, no dizziness Psychiatric: no depression, no anxiety   Objective:    BP 120/68 (BP Location: Left Arm, Patient Position: Sitting, Cuff Size: Large)   Pulse 76   Ht 5' 11 (1.803 m)   Wt 221 lb 9.6 oz (100.5 kg)   BMI 30.91 kg/m   Wt Readings from Last 3 Encounters:  10/05/23 221 lb 9.6 oz (100.5 kg)  06/30/23 230 lb 3.2 oz (104.4 kg)  01/18/23 232 lb (105.2 kg)    BP Readings from Last 3 Encounters:  10/05/23 120/68  06/30/23 134/72  01/18/23 124/70     Physical Exam- Limited  Constitutional:  Body mass index is 30.91 kg/m. , not in acute  distress, normal state of mind Eyes:  EOMI, no exophthalmos Musculoskeletal: no gross deformities, strength intact in all four extremities, no gross restriction of joint movements Skin:  no rashes, no hyperemia Neurological: no tremor with outstretched hands   Diabetic Foot Exam - Simple   Simple Foot Form Diabetic Foot exam was performed with the following findings: Yes 10/05/2023  8:16 AM  Visual Inspection No deformities, no ulcerations, no other skin breakdown bilaterally: Yes Sensation Testing Intact to touch and monofilament testing bilaterally: Yes Pulse Check Posterior Tibialis and Dorsalis pulse intact bilaterally: Yes Comments Onychomycosis bilaterally- dry flaky skin     Lipid Panel     Component Value Date/Time   CHOL 139 04/27/2022 0828   TRIG 190 (H) 04/27/2022 0828   HDL 22 (L) 04/27/2022 0828   CHOLHDL 6.3 (H) 04/27/2022 0828   CHOLHDL 10.4 (H) 03/04/2020 0709   VLDL  54 (H) 06/03/2018 0930   LDLCALC 84 04/27/2022 0828   LDLCALC  03/04/2020 0709     Comment:     . LDL cholesterol not calculated. Triglyceride levels greater than 400 mg/dL invalidate calculated LDL results. . Reference range: <100 . Desirable range <100 mg/dL for primary prevention;   <70 mg/dL for patients with CHD or diabetic patients  with > or = 2 CHD risk factors. SABRA LDL-C is now calculated using the Martin-Hopkins  calculation, which is a validated novel method providing  better accuracy than the Friedewald equation in the  estimation of LDL-C.  Gladis APPLETHWAITE et al. SANDREA. 7986;689(80): 2061-2068  (http://education.[facility].com/faq/FAQ164)    Recent Results (from the past 2160 hours)  HgB A1c     Status: Abnormal   Collection Time: 10/05/23  8:13 AM  Result Value Ref Range   Hemoglobin A1C 6.3 (A) 4.0 - 5.6 %   HbA1c POC (<> result, manual entry)     HbA1c, POC (prediabetic range)     HbA1c, POC (controlled diabetic range)              Latest Ref Rng & Units  12/29/2022    9:20 AM 04/27/2022    8:28 AM 04/08/2021    8:28 AM  CMP  Glucose 70 - 99 mg/dL 851  886  869   BUN 8 - 27 mg/dL 19  16  17    Creatinine 0.76 - 1.27 mg/dL 8.67  8.68  8.68   Sodium 134 - 144 mmol/L 141  142  138   Potassium 3.5 - 5.2 mmol/L 4.8  4.9  4.9   Chloride 96 - 106 mmol/L 103  105  99   CO2 20 - 29 mmol/L 24  22  25    Calcium  8.6 - 10.2 mg/dL 9.7  9.9  9.9   Total Protein 6.0 - 8.5 g/dL 7.1  7.3  7.3   Total Bilirubin 0.0 - 1.2 mg/dL 0.5  0.8  0.8   Alkaline Phos 44 - 121 IU/L 38  42  47   AST 0 - 40 IU/L 43  40  41   ALT 0 - 44 IU/L 38  32  33      Assessment & Plan:   1) Uncontrolled type 2 diabetes mellitus with CKD  - Patient has currently uncontrolled symptomatic type 2 DM since  73 years of age.  He presents today, accompanied by his wife, with his CGM showing at goal glycemic profile overall.  His POCT A1c today is 6.3%, improving from last visit of 8.3%.  Analysis of his CGM shows TIR 90%, TAR 9%, TBR 1% with a GMI of 6.4%.  He has tolerated the Mounjaro  well, only mild side effects.    - His diabetes is complicated by worsening CKD, obesity/sedentary life and patient remains at a high risk for more acute and chronic complications of diabetes which include CAD, CVA, CKD, retinopathy, and neuropathy. These are all discussed in detail with the patient.  - Nutritional counseling repeated at each appointment due to patients tendency to fall back in to old habits.  - The patient admits there is a room for improvement in their diet and drink choices. -  Suggestion is made for the patient to avoid simple carbohydrates from their diet including Cakes, Sweet Desserts / Pastries, Ice Cream, Soda (diet and regular), Sweet Tea, Candies, Chips, Cookies, Sweet Pastries, Store Bought Juices, Alcohol in Excess of 1-2 drinks a day, Artificial Sweeteners, Coffee Creamer, and Sugar-free  Products. This will help patient to have stable blood glucose profile and potentially  avoid unintended weight gain.   - I encouraged the patient to switch to unprocessed or minimally processed complex starch and increased protein intake (animal or plant source), fruits, and vegetables.   - Patient is advised to stick to a routine mealtimes to eat 3 meals a day and avoid unnecessary snacks (to snack only to correct hypoglycemia).  - I have approached patient with the following individualized plan to manage diabetes and patient agrees:   -He is advised to lower Tresiba  to 65 units SQ nightly, continue Metformin  500 mg po daily with breakfast, and continue Mounjaro  5 mg SQ weekly.  -He is encouraged to continue monitoring blood glucose twice daily (using his CGM), before meals and before bed, and to call the clinic if he has readings less than 70 or greater than 200 for 3 tests in a row.  He is benefiting from his CGM device, advised to continue.  2) Lipids/HPL:  His most recent lipid panel from 07/26/22 shows controlled LDL of 69 and elevated triglycerides of 212.  He is advised to continue Lipitor 40 mg po daily at bedtime, Fenofibrate  145 mg po daily and Fish Oil  supplement 2 g po twice daily.  He is encouraged to stay away from fried foods or red meats.  3) Hypertension:  His blood pressure is controlled to target.  He is encouraged to continue his medications including Lisinopril  5 mg p.o. daily.   4) Vitamin D  deficiency -His most recent Vitamin D  level was 29.6 on 04/27/22.  He is advised to continue with vitamin D3 supplement 5000 units daily as maintenance.  5) Weight management:  His Body mass index is 30.91 kg/m.--clearly complicating his diabetes management  He is a candidate for modest weight loss.  Carbs and exercise regimen discussed with him.  He is advised to maintain close follow-up with his PMD on routine basis.      I spent  34  minutes in the care of the patient today including review of labs from CMP, Lipids, Thyroid  Function, Hematology (current and  previous including abstractions from other facilities); face-to-face time discussing  his blood glucose readings/logs, discussing hypoglycemia and hyperglycemia episodes and symptoms, medications doses, his options of short and long term treatment based on the latest standards of care / guidelines;  discussion about incorporating lifestyle medicine;  and documenting the encounter. Risk reduction counseling performed per USPSTF guidelines to reduce obesity and cardiovascular risk factors.     Please refer to Patient Instructions for Blood Glucose Monitoring and Insulin /Medications Dosing Guide  in media tab for additional information. Please  also refer to  Patient Self Inventory in the Media  tab for reviewed elements of pertinent patient history.  Elsie George participated in the discussions, expressed understanding, and voiced agreement with the above plans.  All questions were answered to his satisfaction. he is encouraged to contact clinic should he have any questions or concerns prior to his return visit.    Follow up plan: - Return in about 4 months (around 02/04/2024) for Diabetes F/U with A1c in office, No previsit labs, Bring meter and logs.   Benton Rio, [facility] [address] [place], [place] [address] Phone: [phone] Fax: [phone]  10/05/2023, 8:22 AM

## 2023-11-23 DIAGNOSIS — Z794 Long term (current) use of insulin: Secondary | ICD-10-CM | POA: Diagnosis not present

## 2023-11-23 DIAGNOSIS — Z7689 Persons encountering health services in other specified circumstances: Secondary | ICD-10-CM | POA: Diagnosis not present

## 2023-11-23 DIAGNOSIS — Z7984 Long term (current) use of oral hypoglycemic drugs: Secondary | ICD-10-CM | POA: Diagnosis not present

## 2023-11-23 DIAGNOSIS — Z6831 Body mass index (BMI) 31.0-31.9, adult: Secondary | ICD-10-CM | POA: Diagnosis not present

## 2023-11-23 DIAGNOSIS — R42 Dizziness and giddiness: Secondary | ICD-10-CM | POA: Diagnosis not present

## 2023-11-23 DIAGNOSIS — E782 Mixed hyperlipidemia: Secondary | ICD-10-CM | POA: Diagnosis not present

## 2023-11-23 DIAGNOSIS — Z125 Encounter for screening for malignant neoplasm of prostate: Secondary | ICD-10-CM | POA: Diagnosis not present

## 2023-11-23 DIAGNOSIS — Z5320 Procedure and treatment not carried out because of patient's decision for unspecified reasons: Secondary | ICD-10-CM | POA: Diagnosis not present

## 2023-11-23 DIAGNOSIS — E119 Type 2 diabetes mellitus without complications: Secondary | ICD-10-CM | POA: Diagnosis not present

## 2023-11-23 LAB — MICROALBUMIN / CREATININE URINE RATIO: Microalb Creat Ratio: 12

## 2023-11-23 LAB — BASIC METABOLIC PANEL WITH GFR
BUN: 17 (ref 4–21)
Creatinine: 1.3 (ref 0.6–1.3)
Glucose: 92

## 2023-11-23 LAB — COMPREHENSIVE METABOLIC PANEL WITH GFR: eGFR: 56

## 2023-11-23 LAB — TSH: TSH: 3.12 (ref 0.41–5.90)

## 2023-11-23 LAB — LIPID PANEL
LDL Cholesterol: 83
Triglycerides: 191 — AB (ref 40–160)

## 2023-11-23 LAB — HEMOGLOBIN A1C: Hemoglobin A1C: 6.8

## 2023-12-27 DIAGNOSIS — Z79899 Other long term (current) drug therapy: Secondary | ICD-10-CM | POA: Diagnosis not present

## 2023-12-27 DIAGNOSIS — E1165 Type 2 diabetes mellitus with hyperglycemia: Secondary | ICD-10-CM | POA: Diagnosis not present

## 2023-12-27 DIAGNOSIS — Z794 Long term (current) use of insulin: Secondary | ICD-10-CM | POA: Diagnosis not present

## 2023-12-27 DIAGNOSIS — E782 Mixed hyperlipidemia: Secondary | ICD-10-CM | POA: Diagnosis not present

## 2023-12-27 DIAGNOSIS — Z7984 Long term (current) use of oral hypoglycemic drugs: Secondary | ICD-10-CM | POA: Diagnosis not present

## 2023-12-27 DIAGNOSIS — E119 Type 2 diabetes mellitus without complications: Secondary | ICD-10-CM | POA: Diagnosis not present

## 2023-12-27 DIAGNOSIS — Z125 Encounter for screening for malignant neoplasm of prostate: Secondary | ICD-10-CM | POA: Diagnosis not present

## 2023-12-27 DIAGNOSIS — Z7985 Long-term (current) use of injectable non-insulin antidiabetic drugs: Secondary | ICD-10-CM | POA: Diagnosis not present

## 2023-12-27 DIAGNOSIS — Z23 Encounter for immunization: Secondary | ICD-10-CM | POA: Diagnosis not present

## 2024-02-06 ENCOUNTER — Encounter: Payer: Self-pay | Admitting: Nurse Practitioner

## 2024-02-06 ENCOUNTER — Ambulatory Visit (INDEPENDENT_AMBULATORY_CARE_PROVIDER_SITE_OTHER): Admitting: Nurse Practitioner

## 2024-02-06 VITALS — BP 124/70 | HR 79 | Ht 71.0 in | Wt 219.0 lb

## 2024-02-06 DIAGNOSIS — E559 Vitamin D deficiency, unspecified: Secondary | ICD-10-CM

## 2024-02-06 DIAGNOSIS — E782 Mixed hyperlipidemia: Secondary | ICD-10-CM

## 2024-02-06 DIAGNOSIS — I10 Essential (primary) hypertension: Secondary | ICD-10-CM

## 2024-02-06 DIAGNOSIS — N1831 Chronic kidney disease, stage 3a: Secondary | ICD-10-CM | POA: Diagnosis not present

## 2024-02-06 DIAGNOSIS — E1122 Type 2 diabetes mellitus with diabetic chronic kidney disease: Secondary | ICD-10-CM

## 2024-02-06 DIAGNOSIS — Z7984 Long term (current) use of oral hypoglycemic drugs: Secondary | ICD-10-CM | POA: Diagnosis not present

## 2024-02-06 DIAGNOSIS — Z794 Long term (current) use of insulin: Secondary | ICD-10-CM

## 2024-02-06 MED ORDER — PEN NEEDLES 31G X 5 MM MISC: 3 refills | Status: AC

## 2024-02-06 MED ORDER — TRESIBA FLEXTOUCH 100 UNIT/ML ~~LOC~~ SOPN: 40.0000 [IU] | PEN_INJECTOR | Freq: Every day | SUBCUTANEOUS | 3 refills | Status: AC

## 2024-02-06 MED ORDER — FREESTYLE LIBRE 3 PLUS SENSOR MISC: 3 refills | Status: AC

## 2024-02-06 MED ORDER — TIRZEPATIDE 5 MG/0.5ML ~~LOC~~ SOAJ: 5.0000 mg | SUBCUTANEOUS | 1 refills | Status: AC

## 2024-02-06 NOTE — Progress Notes (Signed)
 02/06/2024    Endocrinology follow-up note   Subjective:    Patient ID: Javier Walker, male    DOB: 1950/06/25.  He is being seen in follow-up  of management of diabetes, Hypertension, hyperlipidemia.   Past Medical History:  Diagnosis Date   Diabetes mellitus without complication (HCC)    GERD (gastroesophageal reflux disease)    occ-no meds   Hives    patient has hives with anxiety   Hyperlipemia    Hypertension    Shortness of breath    Past Surgical History:  Procedure Laterality Date   CHOLECYSTECTOMY N/A 08/26/2016   Procedure: CHOLECYSTECTOMY;  Surgeon: Mavis Anes, MD;  Location: [facility];  Service: General;  Laterality: N/A;   COLON SURGERY  2011   gangrene lower colon with colostomy   COLOSTOMY REVERSAL  2011   ERCP N/A 08/17/2016   Procedure: ENDOSCOPIC RETROGRADE CHOLANGIOPANCREATOGRAPHY (ERCP) Biliary sphincterotomy and stone extraction;  Surgeon: Golda Claudis PENNER, MD;  Location: [facility];  Service: Endoscopy;  Laterality: N/A;   OPEN REDUCTION INTERNAL FIXATION (ORIF) TIBIA/FIBULA FRACTURE Right 08/30/2012   Procedure: OPEN REDUCTION INTERNAL FIXATION (ORIF) TIBIA/FIBULA FRACTURE;  Surgeon: Norleen Armor, MD;  Location: [facility];  Service: Orthopedics;  Laterality: Right;   WOUND DEBRIDEMENT  2011   abd-   Social History   Socioeconomic History   Marital status: Married    Spouse name: Not on file   Number of children: Not on file   Years of education: Not on file   Highest education level: Not on file  Occupational History   Not on file  Tobacco Use   Smoking status: Former    Current packs/day: 0.00    Average packs/day: 2.0 packs/day for 15.0 years (30.0 ttl pk-yrs)    Types: Cigarettes    Start date: 08/27/1973    Quit date: 08/27/1988    Years since quitting: 35.4   Smokeless tobacco: Never  Vaping Use   Vaping status: Never Used  Substance and Sexual Activity   Alcohol use: Yes    Comment: rarely   Drug use: No    Sexual activity: Yes    Birth control/protection: None  Other Topics Concern   Not on file  Social History Narrative   Not on file   Social Drivers of Health   Financial Resource Strain: Low Risk  (07/28/2022)   Overall Financial Resource Strain (CARDIA)    Difficulty of Paying Living Expenses: Not very hard  Food Insecurity: No Food Insecurity (06/27/2022)   Hunger Vital Sign    Worried About Running Out of Food in the Last Year: Never true    Ran Out of Food in the Last Year: Never true  Transportation Needs: No Transportation Needs (12/05/2022)   PRAPARE - Administrator, Civil Service (Medical): No    Lack of Transportation (Non-Medical): No  Physical Activity: Inactive (06/27/2022)   Exercise Vital Sign    Days of Exercise per Week: 0 days    Minutes of Exercise per Session: 0 min  Stress: Unknown (06/03/2018)   [employer] of Occupational Health - Occupational Stress Questionnaire    Feeling of Stress : Patient declined  Social Connections: Unknown (06/03/2018)   Social Connection and Isolation Panel    Frequency of Communication with Friends and Family: Patient declined    Frequency of Social Gatherings with Friends and Family: Patient declined    Attends Religious Services: Patient declined    Database Administrator or Organizations: Patient  declined    Attends Banker Meetings: Patient declined    Marital Status: Patient declined   Outpatient Encounter Medications as of 02/06/2024  Medication Sig   acetaminophen  (TYLENOL ) 325 MG tablet Take 2 tablets (650 mg total) by mouth every 6 (six) hours as needed for mild pain, fever or headache (or Fever >/= 101).   aspirin  EC 81 MG tablet Take 81 mg by mouth daily. Swallow whole.   atorvastatin  (LIPITOR) 40 MG tablet Take 1 tablet (40 mg total) by mouth every evening.   Cholecalciferol  (VITAMIN D3) 125 MCG (5000 UT) CAPS Take 1 capsule (5,000 Units total) by mouth daily.   fenofibrate  (TRICOR ) 145 MG  tablet Take 1 tablet by mouth once daily   glucose blood (ONETOUCH VERIO) test strip Use as instructed to monitor glucose twice daily, before breakfast and before bed.   Insulin  Pen Needle (PEN NEEDLES) 31G X 5 MM MISC Use to inject insulin  once daily   lisinopril  (ZESTRIL ) 5 MG tablet Take 5 mg by mouth daily.   metFORMIN  (GLUCOPHAGE ) 500 MG tablet Take 1 tablet (500 mg total) by mouth daily with breakfast.   Omega-3 Fatty Acids  (FISH OIL ) 1200 MG CAPS Take 2,400 mg by mouth daily.   [DISCONTINUED] B-D ULTRAFINE III SHORT PEN 31G X 8 MM MISC USE 1 EACH DAILY. USE TO INJECT INSULIN  DAILY.   [DISCONTINUED] Continuous Glucose Sensor (FREESTYLE LIBRE 3 PLUS SENSOR) MISC CHECK GLUCOSE CONTINUOUSLY AND CHANGE SENSOR EVERY 15 DAYS.   [DISCONTINUED] insulin  degludec (TRESIBA  FLEXTOUCH) 100 UNIT/ML FlexTouch Pen Inject 65 Units into the skin at bedtime.   [DISCONTINUED] tirzepatide  (MOUNJARO ) 5 MG/0.5ML Pen Inject 5 mg into the skin once a week.   Continuous Glucose Sensor (FREESTYLE LIBRE 3 PLUS SENSOR) MISC CHECK GLUCOSE CONTINUOUSLY AND CHANGE SENSOR EVERY 15 DAYS.   insulin  degludec (TRESIBA  FLEXTOUCH) 100 UNIT/ML FlexTouch Pen Inject 40 Units into the skin at bedtime.   tirzepatide  (MOUNJARO ) 5 MG/0.5ML Pen Inject 5 mg into the skin once a week.   No facility-administered encounter medications on file as of 02/06/2024.   ALLERGIES: Allergies  Allergen Reactions   Augmentin  [Amoxicillin -Pot Clavulanate] Rash   VACCINATION STATUS: Immunization History  Administered Date(s) Administered   Influenza-Unspecified 02/03/2020    Diabetes He presents for his follow-up diabetic visit. He has type 2 diabetes mellitus. Onset time: He was diagnosed at approximate age of 56 years. His disease course has been improving. There are no hypoglycemic associated symptoms. Pertinent negatives for hypoglycemia include no confusion, pallor or seizures. Associated symptoms include weight loss. Pertinent negatives  for diabetes include no fatigue, no polydipsia, no polyphagia, no polyuria and no weakness. There are no hypoglycemic complications. Symptoms are stable. Diabetic complications include nephropathy. Risk factors for coronary artery disease include diabetes mellitus, dyslipidemia, hypertension, male sex, obesity, tobacco exposure and sedentary lifestyle. Current diabetic treatment includes insulin  injections and oral agent (monotherapy) (and Mounjaro ). He is compliant with treatment most of the time. His weight is decreasing steadily. He is following a generally healthy diet. Meal planning includes avoidance of concentrated sweets. He has not had a previous visit with a dietitian. He never participates in exercise. His home blood glucose trend is decreasing steadily. His overall blood glucose range is 90-110 mg/dl. (He presents today, accompanied by his wife, with his CGM showing at tightening glycemic profile overall.  His most recent A1c, checked at his PCP office on 8/14 was 6.8%, increasing from last visit of 6.3%.  Analysis of his CGM shows TIR  87%, TAR 5%, TBR 8% with a GMI of 5.9%.  He has tolerated the Mounjaro  well, only mild side effects.) An ACE inhibitor/angiotensin II receptor blocker is being taken. He does not see a podiatrist.Eye exam is current.    Review of systems  Constitutional: + decreasing body weight,  current Body mass index is 30.54 kg/m. , no fatigue, no subjective hyperthermia, no subjective hypothermia Eyes: no blurry vision, no xerophthalmia ENT: no sore throat, no nodules palpated in throat, no dysphagia/odynophagia, no hoarseness Cardiovascular: no chest pain, no shortness of breath, no palpitations, no leg swelling Respiratory: no cough, no shortness of breath Gastrointestinal: no nausea/vomiting/diarrhea Musculoskeletal: no muscle/joint aches Skin: no rashes, no hyperemia Neurological: no tremors, no numbness, no tingling, no dizziness Psychiatric: no depression, no  anxiety   Objective:    BP 124/70 (BP Location: Left Arm, Patient Position: Sitting)   Pulse 79   Ht 5' 11 (1.803 m)   Wt 219 lb (99.3 kg)   BMI 30.54 kg/m   Wt Readings from Last 3 Encounters:  02/06/24 219 lb (99.3 kg)  10/05/23 221 lb 9.6 oz (100.5 kg)  06/30/23 230 lb 3.2 oz (104.4 kg)    BP Readings from Last 3 Encounters:  02/06/24 124/70  10/05/23 120/68  06/30/23 134/72     Physical Exam- Limited  Constitutional:  Body mass index is 30.54 kg/m. , not in acute distress, normal state of mind Eyes:  EOMI, no exophthalmos Musculoskeletal: no gross deformities, strength intact in all four extremities, no gross restriction of joint movements Skin:  no rashes, no hyperemia Neurological: no tremor with outstretched hands   Diabetic Foot Exam - Simple   No data filed     Lipid Panel     Component Value Date/Time   CHOL 139 04/27/2022 0828   TRIG 191 (A) 11/23/2023 0000   HDL 22 (L) 04/27/2022 0828   CHOLHDL 6.3 (H) 04/27/2022 0828   CHOLHDL 10.4 (H) 03/04/2020 0709   VLDL 54 (H) 06/03/2018 0930   LDLCALC 83 11/23/2023 0000   LDLCALC 84 04/27/2022 0828   LDLCALC  03/04/2020 0709     Comment:     . LDL cholesterol not calculated. Triglyceride levels greater than 400 mg/dL invalidate calculated LDL results. . Reference range: <100 . Desirable range <100 mg/dL for primary prevention;   <70 mg/dL for patients with CHD or diabetic patients  with > or = 2 CHD risk factors. SABRA LDL-C is now calculated using the Martin-Hopkins  calculation, which is a validated novel method providing  better accuracy than the Friedewald equation in the  estimation of LDL-C.  Gladis APPLETHWAITE et al. SANDREA. 7986;689(80): 2061-2068  (http://education.[facility].com/faq/FAQ164)    Recent Results (from the past 2160 hours)  Microalbumin / creatinine urine ratio     Status: None   Collection Time: 11/23/23 12:00 AM  Result Value Ref Range   Microalb Creat Ratio 12   Basic  metabolic panel with GFR     Status: None   Collection Time: 11/23/23 12:00 AM  Result Value Ref Range   Glucose 92    BUN 17 4 - 21   Creatinine 1.3 0.6 - 1.3  Comprehensive metabolic panel with GFR     Status: None   Collection Time: 11/23/23 12:00 AM  Result Value Ref Range   eGFR 56   Lipid panel     Status: Abnormal   Collection Time: 11/23/23 12:00 AM  Result Value Ref Range   Triglycerides 191 (A) 40 -  160   LDL Cholesterol 83   Hemoglobin A1c     Status: None   Collection Time: 11/23/23 12:00 AM  Result Value Ref Range   Hemoglobin A1C 6.8   TSH     Status: None   Collection Time: 11/23/23 12:00 AM  Result Value Ref Range   TSH 3.12 0.41 - 5.90            Latest Ref Rng & Units 11/23/2023   12:00 AM 12/29/2022    9:20 AM 04/27/2022    8:28 AM  CMP  Glucose 70 - 99 mg/dL  851  886   BUN 4 - 21 17     19  16    Creatinine 0.6 - 1.3 1.3     1.32  1.31   Sodium 134 - 144 mmol/L  141  142   Potassium 3.5 - 5.2 mmol/L  4.8  4.9   Chloride 96 - 106 mmol/L  103  105   CO2 20 - 29 mmol/L  24  22   Calcium  8.6 - 10.2 mg/dL  9.7  9.9   Total Protein 6.0 - 8.5 g/dL  7.1  7.3   Total Bilirubin 0.0 - 1.2 mg/dL  0.5  0.8   Alkaline Phos 44 - 121 IU/L  38  42   AST 0 - 40 IU/L  43  40   ALT 0 - 44 IU/L  38  32      This result is from an external source.     Assessment & Plan:   1) Uncontrolled type 2 diabetes mellitus with CKD  - Patient has currently uncontrolled symptomatic type 2 DM since  73 years of age.  He presents today, accompanied by his wife, with his CGM showing at tightening glycemic profile overall.  His most recent A1c, checked at his PCP office on 8/14 was 6.8%, increasing from last visit of 6.3%.  Analysis of his CGM shows TIR 87%, TAR 5%, TBR 8% with a GMI of 5.9%.  He has tolerated the Mounjaro  well, only mild side effects.  - His diabetes is complicated by worsening CKD, obesity/sedentary life and patient remains at a high risk for more acute and  chronic complications of diabetes which include CAD, CVA, CKD, retinopathy, and neuropathy. These are all discussed in detail with the patient.  - Nutritional counseling repeated at each appointment due to patients tendency to fall back in to old habits.  - The patient admits there is a room for improvement in their diet and drink choices. -  Suggestion is made for the patient to avoid simple carbohydrates from their diet including Cakes, Sweet Desserts / Pastries, Ice Cream, Soda (diet and regular), Sweet Tea, Candies, Chips, Cookies, Sweet Pastries, Store Bought Juices, Alcohol in Excess of 1-2 drinks a day, Artificial Sweeteners, Coffee Creamer, and Sugar-free Products. This will help patient to have stable blood glucose profile and potentially avoid unintended weight gain.   - I encouraged the patient to switch to unprocessed or minimally processed complex starch and increased protein intake (animal or plant source), fruits, and vegetables.   - Patient is advised to stick to a routine mealtimes to eat 3 meals a day and avoid unnecessary snacks (to snack only to correct hypoglycemia).  - I have approached patient with the following individualized plan to manage diabetes and patient agrees:   -He is advised to lower Tresiba  to 40 units SQ nightly, continue Metformin  500 mg po daily with breakfast, and continue  Mounjaro  5 mg SQ weekly (may increase this at next visit to help attempts in getting off insulin ).  -He is encouraged to continue monitoring blood glucose twice daily (using his CGM), before meals and before bed, and to call the clinic if he has readings less than 70 or greater than 200 for 3 tests in a row.  He is benefiting from his CGM device, advised to continue.  2) Lipids/HPL:  His most recent lipid panel from 11/23/23 shows controlled LDL of 83 and elevated triglycerides of 191 (improving).  He is advised to continue Lipitor 40 mg po daily at bedtime, Fenofibrate  145 mg po daily and  Fish Oil  supplement 2 g po twice daily.  He is encouraged to stay away from fried foods or red meats.  3) Hypertension:  His blood pressure is controlled to target.  He is encouraged to continue his medications including Lisinopril  5 mg p.o. daily.   4) Vitamin D  deficiency -His most recent Vitamin D  level was 29.6 on 04/27/22.  He is advised to continue with vitamin D3 supplement 5000 units daily as maintenance.  5) Weight management:  His Body mass index is 30.54 kg/m.--clearly complicating his diabetes management  He is a candidate for modest weight loss.  Carbs and exercise regimen discussed with him.  He is advised to maintain close follow-up with his PMD on routine basis.      I spent  27  minutes in the care of the patient today including review of labs from CMP, Lipids, Thyroid  Function, Hematology (current and previous including abstractions from other facilities); face-to-face time discussing  his blood glucose readings/logs, discussing hypoglycemia and hyperglycemia episodes and symptoms, medications doses, his options of short and long term treatment based on the latest standards of care / guidelines;  discussion about incorporating lifestyle medicine;  and documenting the encounter. Risk reduction counseling performed per USPSTF guidelines to reduce obesity and cardiovascular risk factors.     Please refer to Patient Instructions for Blood Glucose Monitoring and Insulin /Medications Dosing Guide  in media tab for additional information. Please  also refer to  Patient Self Inventory in the Media  tab for reviewed elements of pertinent patient history.  Elsie George participated in the discussions, expressed understanding, and voiced agreement with the above plans.  All questions were answered to his satisfaction. he is encouraged to contact clinic should he have any questions or concerns prior to his return visit.    Follow up plan: - Return in about 4 months (around  06/08/2024) for Diabetes F/U with A1c in office, No previsit labs, Bring meter and logs.   Benton Rio, [facility] [address] [place], [place] [address] Phone: [phone] Fax: [phone]  02/06/2024, 8:18 AM

## 2024-02-18 ENCOUNTER — Other Ambulatory Visit: Payer: Self-pay | Admitting: Nurse Practitioner

## 2024-06-10 ENCOUNTER — Ambulatory Visit: Admitting: Nurse Practitioner
